# Patient Record
Sex: Male | Born: 1955 | Race: White | Hispanic: No | Smoking: Former smoker
Health system: Southern US, Community
[De-identification: ages and names within clinical notes are randomized; demographics above are authoritative.]

## PROBLEM LIST (undated history)

## (undated) DIAGNOSIS — E7800 Pure hypercholesterolemia, unspecified: Secondary | ICD-10-CM

## (undated) DIAGNOSIS — M1990 Unspecified osteoarthritis, unspecified site: Secondary | ICD-10-CM

## (undated) DIAGNOSIS — F32A Depression, unspecified: Secondary | ICD-10-CM

## (undated) DIAGNOSIS — G4730 Sleep apnea, unspecified: Secondary | ICD-10-CM

## (undated) DIAGNOSIS — T4145XA Adverse effect of unspecified anesthetic, initial encounter: Secondary | ICD-10-CM

## (undated) DIAGNOSIS — F419 Anxiety disorder, unspecified: Secondary | ICD-10-CM

## (undated) DIAGNOSIS — T8859XA Other complications of anesthesia, initial encounter: Secondary | ICD-10-CM

## (undated) DIAGNOSIS — K219 Gastro-esophageal reflux disease without esophagitis: Secondary | ICD-10-CM

## (undated) DIAGNOSIS — F329 Major depressive disorder, single episode, unspecified: Secondary | ICD-10-CM

## (undated) HISTORY — PX: OTHER SURGICAL HISTORY: SHX169

## (undated) HISTORY — PX: FOOT SURGERY: SHX648

## (undated) HISTORY — PX: LUMBAR LAMINECTOMY: SHX95

## (undated) HISTORY — PX: CATARACT EXTRACTION: SUR2

---
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
## 2001-02-23 ENCOUNTER — Emergency Department (HOSPITAL_COMMUNITY): Admission: EM | Admit: 2001-02-23 | Discharge: 2001-02-23 | Payer: Self-pay | Admitting: Internal Medicine

## 2001-02-23 ENCOUNTER — Encounter: Payer: Self-pay | Admitting: *Deleted

## 2003-05-18 ENCOUNTER — Emergency Department (HOSPITAL_COMMUNITY): Admission: EM | Admit: 2003-05-18 | Discharge: 2003-05-18 | Payer: Self-pay | Admitting: Internal Medicine

## 2003-09-01 ENCOUNTER — Emergency Department (HOSPITAL_COMMUNITY): Admission: EM | Admit: 2003-09-01 | Discharge: 2003-09-01 | Payer: Self-pay | Admitting: Emergency Medicine

## 2006-07-19 HISTORY — PX: COLONOSCOPY: SHX174

## 2006-08-18 ENCOUNTER — Emergency Department (HOSPITAL_COMMUNITY): Admission: EM | Admit: 2006-08-18 | Discharge: 2006-08-18 | Payer: Self-pay | Admitting: Emergency Medicine

## 2006-08-22 ENCOUNTER — Ambulatory Visit: Payer: Self-pay | Admitting: Orthopedic Surgery

## 2007-03-19 ENCOUNTER — Encounter: Payer: Self-pay | Admitting: Pulmonary Disease

## 2007-03-19 ENCOUNTER — Ambulatory Visit: Admission: RE | Admit: 2007-03-19 | Discharge: 2007-03-19 | Payer: Self-pay | Admitting: Internal Medicine

## 2007-03-29 ENCOUNTER — Ambulatory Visit: Payer: Self-pay | Admitting: Pulmonary Disease

## 2007-04-14 ENCOUNTER — Encounter: Payer: Self-pay | Admitting: Gastroenterology

## 2007-04-14 ENCOUNTER — Ambulatory Visit: Payer: Self-pay | Admitting: Gastroenterology

## 2007-04-14 ENCOUNTER — Ambulatory Visit (HOSPITAL_COMMUNITY): Admission: RE | Admit: 2007-04-14 | Discharge: 2007-04-14 | Payer: Self-pay | Admitting: Gastroenterology

## 2007-05-05 ENCOUNTER — Ambulatory Visit (HOSPITAL_COMMUNITY): Admission: RE | Admit: 2007-05-05 | Discharge: 2007-05-05 | Payer: Self-pay | Admitting: Internal Medicine

## 2007-10-10 ENCOUNTER — Ambulatory Visit (HOSPITAL_COMMUNITY): Admission: RE | Admit: 2007-10-10 | Discharge: 2007-10-10 | Payer: Self-pay | Admitting: Neurosurgery

## 2009-07-23 ENCOUNTER — Ambulatory Visit: Payer: Self-pay | Admitting: Pulmonary Disease

## 2009-07-23 DIAGNOSIS — G4733 Obstructive sleep apnea (adult) (pediatric): Secondary | ICD-10-CM | POA: Insufficient documentation

## 2009-07-23 DIAGNOSIS — E785 Hyperlipidemia, unspecified: Secondary | ICD-10-CM | POA: Insufficient documentation

## 2009-08-02 DIAGNOSIS — G2581 Restless legs syndrome: Secondary | ICD-10-CM | POA: Insufficient documentation

## 2009-12-12 ENCOUNTER — Ambulatory Visit (HOSPITAL_COMMUNITY): Admission: RE | Admit: 2009-12-12 | Discharge: 2009-12-12 | Payer: Self-pay | Admitting: Internal Medicine

## 2010-08-18 NOTE — Assessment & Plan Note (Signed)
Summary: consult for osa/rls   Copy to:  Carylon Perches Primary Provider/Referring Provider:  Carylon Perches  CC:  Sleep Consult.  History of Present Illness: The pt is a 55y/o male who I have been asked to see for osa.  He was diagnosed in 2008 with moderate osa, with split night study showing AHI of 32/hr and desat to 79%.  He was titrated to an optimal cpap pressure of 7cm.  Of note, he was also noted to have 472 plms, with 3/hr resulting in sleep disruption.  It was unclear how much this was due to sleep disordered breathing, and how much may represent a primary movement disorder of sleep.  The pt was started on cpap with a nasal mask and chin strap, but despite this, continued to have mouth leaks.  He is unsure of his current cpap pressure setting.  He has not used cpap in 6mos, but did feel it helped him when he first started using.  He goes to bed at 8pm, and arises at 3:30 am to start his day.  He is not rested upon arising.  He still has loud snoring, and admits to EDS while at work with difficulty focusing and concentrating.  He will doze in evening watching tv, but denies any issues with driving.  His weight is neutral over the last 2 years, and his epworth score today is 12.  He continues to have issues with RLS symptoms in the evening, and does have leg kicks during the night.  Preventive Screening-Counseling & Management  Alcohol-Tobacco     Smoking Status: quit  Current Medications (verified): 1)  Fluoxetine Hcl 20 Mg Caps (Fluoxetine Hcl) .... Take 1 Tablet By Mouth Once A Day 2)  Gemfibrozil 600 Mg Tabs (Gemfibrozil) .... Take 1 Tablet By Mouth Two Times A Day 3)  Pravastatin Sodium 40 Mg Tabs (Pravastatin Sodium) .... Take 1 Tablet By Mouth Once A Day  Allergies (verified): 1)  ! Codeine  Past History:  Past Medical History:  HYPERLIPIDEMIA (ICD-272.4) OBSTRUCTIVE SLEEP APNEA (ICD-327.23)    Past Surgical History: L arm surgery vasectomy  Family History: Reviewed history  and no changes required. heart disease: mother, father, sisters, and 1 brother brother had benign tumor in brain  Social History: Reviewed history and no changes required. Patient states former smoker.  started at age 15.  1 1/2 to 2 ppd.  quit 1987. pt is single. pt has children. pt work as a Forensic psychologist.  Smoking Status:  quit  Review of Systems       The patient complains of productive cough, nasal congestion/difficulty breathing through nose, hand/feet swelling, and joint stiffness or pain.  The patient denies shortness of breath with activity, shortness of breath at rest, non-productive cough, coughing up blood, chest pain, irregular heartbeats, acid heartburn, indigestion, loss of appetite, weight change, abdominal pain, difficulty swallowing, sore throat, tooth/dental problems, headaches, sneezing, itching, ear ache, anxiety, depression, rash, change in color of mucus, and fever.    Vital Signs:  Patient profile:   56 year old male Height:      70 inches Weight:      260.25 pounds BMI:     37.48 O2 Sat:      93 % on Room air Temp:     98.0 degrees F oral Pulse rate:   91 / minute BP sitting:   122 / 80  (left arm) Cuff size:   large  Vitals Entered By: Arman Filter LPN (July 23, 2009 1:56 PM)  O2 Flow:  Room air CC: Sleep Consult Comments Medications reviewed with patient Arman Filter LPN  July 23, 2009 1:57 PM    Physical Exam  General:  ow male in nad Eyes:  PERRLA and EOMI.   Nose:  patent without discharge. Mouth:  significant elongation of soft palate and uvula Neck:  no jvd, tmg, LN Lungs:  clear to auscultation Heart:  rrr, no mrg Abdomen:  soft and nontender, bs+ Extremities:  mild ankle edema, no cyanosis pulses intact distally  Neurologic:  alert and oriented, moves all 4.   Impression & Recommendations:  Problem # 1:  OBSTRUCTIVE SLEEP APNEA (ICD-327.23) the pt has known moderate osa, and currently is symptomatic off  cpap.  He is willing to try again, and I have also encouraged him to work on weight loss.  I think he needs to make the change to a full face mask in light of his ongoing mouth opening.  Rarely are chin straps successful, and usually aggrevate more than anything else.  Will also reoptimize pressure with an autotitrating device for 2 weeks.  Problem # 2:  RESTLESS LEGS SYNDROME (ICD-333.94) the pt's history is suggestive of RLS, and he did have a lot of leg jerks on prior sleep study.  Will give him a 4 week trial of dopamine agonist to see if leg symptoms improve.  Medications Added to Medication List This Visit: 1)  Fluoxetine Hcl 20 Mg Caps (Fluoxetine hcl) .... Take 1 tablet by mouth once a day 2)  Gemfibrozil 600 Mg Tabs (Gemfibrozil) .... Take 1 tablet by mouth two times a day 3)  Pravastatin Sodium 40 Mg Tabs (Pravastatin sodium) .... Take 1 tablet by mouth once a day 4)  Requip 0.5 Mg Tabs (Ropinirole hcl) .... One after dinner.  Other Orders: Consultation Level IV (16109) DME Referral (DME)  Patient Instructions: 1)  will try requip 0.5mg  after dinner to see if it helps your leg jerks/sensations 2)  will change to full facemask  3)  will use autotitrating machine for next 2 weeks to optimize pressure. 4)  work on weight loss 5)  I will call you once I receive your download.   Prescriptions: REQUIP 0.5 MG  TABS (ROPINIROLE HCL) one after dinner.  #30 x 6   Entered and Authorized by:   Barbaraann Share MD   Signed by:   Barbaraann Share MD on 07/23/2009   Method used:   Print then Give to Patient   RxID:   [id number]

## 2010-08-18 NOTE — Progress Notes (Signed)
Summary: Initial evaluation  Initial evaluation   Imported By: Jacklynn Ganong 01/07/2010 13:59:10  _____________________________________________________________________  External Attachment:    Type:   Image     Comment:   External Document

## 2010-10-29 ENCOUNTER — Ambulatory Visit (HOSPITAL_COMMUNITY): Payer: BC Managed Care – PPO | Attending: Internal Medicine

## 2010-10-29 DIAGNOSIS — R079 Chest pain, unspecified: Secondary | ICD-10-CM | POA: Insufficient documentation

## 2010-11-02 NOTE — Progress Notes (Signed)
  NAMEJAPHETH, DIEKMAN               ACCOUNT NO.:  [id number]  MEDICAL RECORD NO.:  [id number]           PATIENT TYPE:  O  LOCATION:  [facility]                          FACILITY:  [facility]  PHYSICIAN:  Kingsley Callander. Ouida Sills, MD       DATE OF BIRTH:  12-07-1955  DATE OF PROCEDURE:  10/29/2010 DATE OF DISCHARGE:                                PROGRESS NOTE   Mr. Sherrod underwent a stress test for evaluation of recent symptoms of chest pain.  He exercised 8 minutes 9 seconds (2 minutes 9 seconds at stage III of the Bruce protocol) attaining the maximal heart rate of 160 (96% of the age predicted maximal heart rate) at a workload of 10.1 mets and discontinued exercise due to fatigue.  There were no symptoms of chest pain.  There were no arrhythmias.  There were no ST-segment changes diagnostic of ischemia.  Baseline electrocardiogram revealed normal sinus rhythm at 70 beats per minute.  IMPRESSION:  No evidence of exercise-induced ischemia.     Kingsley Callander. Ouida Sills, MD     ROF/MEDQ  D:  10/29/2010  T:  10/29/2010  Job:  [id number]  Electronically Signed by Carylon Perches MD on 11/01/2010 10:09:39 AM

## 2010-12-01 NOTE — Procedures (Signed)
[name], [name]               ACCOUNT NO.:  [id number]   MEDICAL RECORD NO.:  [id number]          PATIENT TYPE:  OUT   LOCATION:  SLEEP LAB                     FACILITY:  [facility]   PHYSICIAN:  Barbaraann Share, MD,FCCPDATE OF BIRTH:  23-Jul-1955   DATE OF STUDY:  03/19/2007                            NOCTURNAL POLYSOMNOGRAM   REFERRING PHYSICIAN:  Kingsley Callander. Ouida Sills, MD   INDICATION FOR THE STUDY:  Hypersomnia with sleep apnea.   EPWORTH SCORE:  11.   SLEEP ARCHITECTURE:  The patient had total sleep time of 347 minutes  with decreased slow wave sleep as well as REM.  Sleep onset latency was  normal and REM onset was prolonged at 123 minutes.  Sleep efficiency was  mildly decreased at 87%.   RESPIRATORY DATA:  The patient was found by split night protocol to have  80 obstructive events in the first 149 minutes of sleep.  This gave him  an extrapolated apnea/hypopnea index over the first half of the night of  32 events per hour.  The events occurred in all body positions and there  was very loud snoring noted throughout.  By protocol, the patient was  then placed on a Respironics CPAP mask that was not further  characterized by the sleep technician.  CPAP titration was then  initiated, and at a final pressure of 7 cm of water, the patient seemed  to have excellent control of both his obstructive events as well as  snoring.  There was good control even through supine REM.   OXYGEN DATA:  O2 desaturation was noted as low as 79% with the patient's  obstructive events.   CARDIAC DATA:  No clinically significant cardiac arrhythmias were noted.   MOVEMENT/PARASOMNIA:  The patient was found to have 483 leg jerks with 3  per hour resulting in arousal and/or awakening.   IMPRESSION/RECOMMENDATIONS:  1. Split night study reveals moderate obstructive sleep apnea/hypopnea      syndrome with an apnea/hypopnea index of 32 events per hour during      the first half of the night and O2 desaturation  as low as 79%.  The      patient was then placed on CPAP with a Respironics mask of unknown      type and ultimately titrated to a final pressure of 7 cm of water      with excellent control even through supine REM.  2. Very large numbers of leg jerks with what appears to be significant      sleep disruption.  It is unclear whether this is related to the      patient's sleep disordered breathing, or whether he may have a      primary movement disorder of      sleep as well.  I would recommend treating the patient's      obstructive sleep apnea first, and then if he continues to be seen      symptomatic, consider whether the leg movements may be a      concomitant primary disorder.      Barbaraann Share, MD,FCCP  Diplomate, American Board of  Sleep  Medicine  Electronically Signed     KMC/MEDQ  D:  03/29/2007 15:15:23  T:  03/30/2007 10:42:42  Job:  [id number]

## 2010-12-01 NOTE — Op Note (Signed)
[name], [name]               ACCOUNT NO.:  [id number]   MEDICAL RECORD NO.:  [id number]          PATIENT TYPE:  AMB   LOCATION:  DAY                           FACILITY:  [facility]   PHYSICIAN:  Kassie Mends, M.D.      DATE OF BIRTH:  1955-10-22   DATE OF PROCEDURE:  04/14/2007  DATE OF DISCHARGE:                               OPERATIVE REPORT   REFERRING PHYSICIAN:  Kingsley Callander. Ouida Sills, MD.   PROCEDURE:  Colonoscopy with cold forceps polypectomy.   INDICATION FOR EXAM:  [name] is a 55 year old male who presents for  average risk colon cancer screening.   FINDINGS:  1. A 4-mm sessile rectal polyp removed via cold forceps.  Otherwise no      polyps, masses, inflammatory changes or AVMs seen.  2. Mild pan colonic diverticulosis most pronounced in the sigmoid      colon.  3. Normal retroflexed view of the rectum.   RECOMMENDATIONS:  1. Will call [name] with results of his polypectomy.  If his      polyp is adenomatous, then he needs a screening colonoscopy in 5      years and his first-degree relatives will need a screening      colonoscopy at age 54 and then every 5 years.  2. He should follow a high fiber diet.  He is given handout on high-      fiber diet polyps and diverticulosis.  3. No aspirin, NSAIDs or anticoagulation for 5 days.   MEDICATIONS:  1. Demerol 50 mg IV.  2. Versed 5 mg IV.   PROCEDURE TECHNIQUE:  Physical exam was performed and informed consent  was obtained from the patient after explaining the benefits, risks and  alternatives to the procedure.  The patient was connected to monitor and  placed in the a left lateral position.  Continuous oxygen was provided  by nasal cannula and IV medicine administered through an indwelling  cannula.  After administration of sedation and rectal exam, the  patient's rectum  was intubated and the scope was advanced under direct visualization to  the cecum.  The scope was removed slowly by carefully examining the  color, texture, anatomy and integrity of the mucosa on the way out.  The  patient was recovered in endoscopy and discharged home in satisfactory  condition.      Kassie Mends, M.D.  Electronically Signed     SM/MEDQ  D:  04/14/2007  T:  04/14/2007  Job:  [id number]   cc:   Kingsley Callander. Ouida Sills, MD  Fax: [phone]

## 2010-12-01 NOTE — Op Note (Signed)
NAMEEDIS, HUISH               ACCOUNT NO.:  [id number]   MEDICAL RECORD NO.:  [id number]          PATIENT TYPE:  OIB   LOCATION:  3599                         FACILITY:  [facility]   PHYSICIAN:  Clydene Fake, M.D.  DATE OF BIRTH:  Nov 05, 1955   DATE OF PROCEDURE:  10/10/2007  DATE OF DISCHARGE:                               OPERATIVE REPORT   PREOPERATIVE DIAGNOSIS:  Left ulnar neuropathy.   POSTOPERATIVE DIAGNOSIS:  Left ulnar neuropathy.   PROCEDURE:  Left ulnar transposition and neurolysis and decompression.   SURGEON:  Clydene Fake, M.D.   ANESTHESIA:  General endotracheal tube anesthesia.   ESTIMATED BLOOD LOSS:  Minimal.   BLOOD GIVEN:  None.   DRAINS:  None.   COMPLICATIONS:  None.   INDICATIONS FOR PROCEDURE:  The patient is a 55 year old gentleman who  has had left arm and hand pain and numbness which improved briefly with  therapy and probable padding, but symptoms worsened.  EMG nerve  conduction velocities showed severe left ulnar neuropathy at the elbow  and weakness in the intrinsic ulnar muscles.  The patient was brought in  for a decompression and transposition of the left ulnar nerve.   DESCRIPTION OF PROCEDURE:  The patient was brought to the operating room  and general anesthesia induced.  The patient was prepped and draped in a  sterile fashion.  A stab incision in the inside of the arm across the  elbow between the flexor and extensor surfaces.  The wound was injected  with 20 mL of 1% lidocaine and incision made.  The incision was taken  down to the fascia and we dissected over the ulnar nerve in the ulnar  notch.  Went through the subcutaneous and scar tissue and found the  ulnar nerve.  Decompressed the nerve going proximally and distally.  Placed Vesi-loops around the nerve carefully, transpositioning the nerve  out of the ulnar notch intermuscular septum proximally.  It was  dissected so that it would not compress into the ulnar nerve.  We  left  branches of the ulnar nerve intact as we transposed it.  The nerve was  in good position.  We extended the arm and the nerve had good enough  slack and we sutured over the ulnar notch as we brought the skin back  together so the nerve could not fall back into it.  We also elevated a  fascial sling loosely around the ulnar nerve.  The ulnar nerve slipped  through this nicely between flexion and extension.  This was sutured  with #2-0 Vicryl sutures.  The subcu was then approximated with  #2-0 and #3-0 Vicryl interrupted sutures.  The skin was closed with #4-0  nylon running suture.  A dressing was placed.   The patient was then awakened from anesthesia and transferred to the  recovery room in stable condition.           ______________________________  Clydene Fake, M.D.     JRH/MEDQ  D:  10/10/2007  T:  10/10/2007  Job:  [id number]

## 2011-04-12 LAB — BASIC METABOLIC PANEL
BUN: 14
CO2: 26
Calcium: 9.5
Chloride: 104
Creatinine, Ser: 1.16
GFR calc Af Amer: >60
GFR calc non Af Amer: >60
Glucose, Bld: 121 — ABNORMAL HIGH
Potassium: 4.1
Sodium: 140

## 2011-04-12 LAB — CBC
HCT: 38.3 — ABNORMAL LOW
Hemoglobin: 13.4
MCHC: 34.9
MCV: 83.7
Platelets: 308
RBC: 4.58
RDW: 13.7
WBC: 6

## 2011-04-12 LAB — URINALYSIS, ROUTINE W REFLEX MICROSCOPIC
Bilirubin Urine: NEGATIVE
Glucose, UA: NEGATIVE
Hgb urine dipstick: NEGATIVE
Ketones, ur: NEGATIVE
Nitrite: NEGATIVE
Protein, ur: NEGATIVE
Specific Gravity, Urine: 1.009
Urobilinogen, UA: 0.2
pH: 6

## 2011-04-12 LAB — PROTIME-INR
INR: 1
Prothrombin Time: 13.4

## 2011-04-12 LAB — APTT: aPTT: 29

## 2011-09-05 ENCOUNTER — Encounter (HOSPITAL_COMMUNITY): Payer: Self-pay | Admitting: Emergency Medicine

## 2011-09-05 ENCOUNTER — Emergency Department (HOSPITAL_COMMUNITY)
Admission: EM | Admit: 2011-09-05 | Discharge: 2011-09-05 | Disposition: A | Discharge status: Home / Self Care | Payer: BC Managed Care – PPO | Attending: Emergency Medicine | Admitting: Emergency Medicine

## 2011-09-05 ENCOUNTER — Emergency Department (HOSPITAL_COMMUNITY): Payer: BC Managed Care – PPO

## 2011-09-05 ENCOUNTER — Other Ambulatory Visit: Payer: Self-pay

## 2011-09-05 DIAGNOSIS — R42 Dizziness and giddiness: Secondary | ICD-10-CM | POA: Insufficient documentation

## 2011-09-05 DIAGNOSIS — R5381 Other malaise: Secondary | ICD-10-CM | POA: Insufficient documentation

## 2011-09-05 DIAGNOSIS — R109 Unspecified abdominal pain: Secondary | ICD-10-CM | POA: Insufficient documentation

## 2011-09-05 DIAGNOSIS — R110 Nausea: Secondary | ICD-10-CM

## 2011-09-05 DIAGNOSIS — I4949 Other premature depolarization: Secondary | ICD-10-CM | POA: Insufficient documentation

## 2011-09-05 HISTORY — DX: Pure hypercholesterolemia, unspecified: E78.00

## 2011-09-05 LAB — URINALYSIS, ROUTINE W REFLEX MICROSCOPIC
Bilirubin Urine: NEGATIVE
Glucose, UA: NEGATIVE mg/dL
Hgb urine dipstick: NEGATIVE
Ketones, ur: NEGATIVE mg/dL
Leukocytes, UA: NEGATIVE
Nitrite: NEGATIVE
Protein, ur: NEGATIVE mg/dL
Specific Gravity, Urine: 1.015 (ref 1.005–1.030)
Urobilinogen, UA: 0.2 mg/dL (ref 0.0–1.0)
pH: 7 (ref 5.0–8.0)

## 2011-09-05 LAB — CBC
HCT: 40.6 % (ref 39.0–52.0)
Hemoglobin: 14.2 g/dL (ref 13.0–17.0)
MCH: 28.7 pg (ref 26.0–34.0)
MCHC: 35 g/dL (ref 30.0–36.0)
MCV: 82 fL (ref 78.0–100.0)
Platelets: 239 10*3/uL (ref 150–400)
RBC: 4.95 MIL/uL (ref 4.22–5.81)
RDW: 13.3 % (ref 11.5–15.5)
WBC: 11.4 10*3/uL — ABNORMAL HIGH (ref 4.0–10.5)

## 2011-09-05 LAB — BASIC METABOLIC PANEL
BUN: 21 mg/dL (ref 6–23)
CO2: 26 mEq/L (ref 19–32)
Calcium: 10.3 mg/dL (ref 8.4–10.5)
Chloride: 100 mEq/L (ref 96–112)
Creatinine, Ser: 1.31 mg/dL (ref 0.50–1.35)
GFR calc Af Amer: 70 mL/min — ABNORMAL LOW (ref >90)
GFR calc non Af Amer: 60 mL/min — ABNORMAL LOW (ref >90)
Glucose, Bld: 146 mg/dL — ABNORMAL HIGH (ref 70–99)
Potassium: 4 mEq/L (ref 3.5–5.1)
Sodium: 137 mEq/L (ref 135–145)

## 2011-09-05 LAB — TROPONIN I: Troponin I: <0.3 ng/mL (ref <0.30)

## 2011-09-05 MED ORDER — SODIUM CHLORIDE 0.9 % IV SOLN
Freq: Once | INTRAVENOUS | Status: AC
  Administered 2011-09-05: 11:00:00 via INTRAVENOUS

## 2011-09-05 MED ORDER — FAMOTIDINE IN NACL 20-0.9 MG/50ML-% IV SOLN
20.0000 mg | Freq: Once | INTRAVENOUS | Status: AC
  Administered 2011-09-05: 20 mg via INTRAVENOUS
  Filled 2011-09-05: qty 50

## 2011-09-05 MED ORDER — ONDANSETRON HCL 4 MG/2ML IJ SOLN
4.0000 mg | Freq: Once | INTRAMUSCULAR | Status: AC
  Administered 2011-09-05: 4 mg via INTRAVENOUS
  Filled 2011-09-05: qty 2

## 2011-09-05 NOTE — ED Provider Notes (Signed)
History  Scribed for [employer]. Colon Branch, MD, the patient was seen in room APA09/APA09. This chart was scribed by Candelaria Stagers. The patient's care started at 11:13 AM     CSN: [id number]  Arrival date & time 09/05/11  4098   First MD Initiated Contact with Patient 09/05/11 1027      Chief Complaint  Patient presents with  . Nausea  . Dizziness     The history is provided by the patient.   [name] is a 56 y.o. male who presents to the Emergency Department complaining of sudden onset of nausea and dizziness that started this morning after eating breakfast.  Pt states that he experienced two episodes of dizziness which lasted about .  He also experienced hot flashes and felt like his "heart dropped".  He denies abdominal pain or burping.  He reports bunionectomy and spur removal on the right foot on 08/23/11.  His PCP is Dr. Ouida Sills and he has seen Dr. Pricilla Holm for the bunionectomy and spur removal on the right foot.   HPI ELEMENTS:  Location:  Onset: sudden  Duration: 30 min episodes Timing: waxing and waning Quality:  Severity:  Modifying factors:none Context: dizziness episodes occurred after eating breakfast Associated symptoms: denies abdominal pain or burping.  Reports hot flashes during episodes.          Past Medical History  Diagnosis Date  . Hypercholesteremia     Past Surgical History  Procedure Date  . Foot surgery     No family history on file.  History  Substance Use Topics  . Smoking status: Former Games developer  . Smokeless tobacco: Not on file  . Alcohol Use: No      Review of Systems  HENT: Negative for trouble swallowing.   Gastrointestinal: Positive for nausea and abdominal pain. Negative for vomiting.  Genitourinary: Positive for difficulty urinating.  Neurological: Positive for dizziness and weakness.  All other systems reviewed and are negative.    Allergies  Codeine  Home Medications  No current outpatient prescriptions on  file.  BP 131/79  Pulse 82  Temp 97.9 F (36.6 C)  Resp 19  Ht 5\' 10"  (1.778 m)  Wt 260 lb (117.935 kg)  BMI 37.31 kg/m2  SpO2 97%  Physical Exam  Nursing note and vitals reviewed. Constitutional: He is oriented to person, place, and time. He appears well-developed and well-nourished.  HENT:  Head: Normocephalic and atraumatic.  Eyes: EOM are normal. Right eye exhibits no discharge. Left eye exhibits no discharge.  Neck: Normal range of motion. Neck supple.  Cardiovascular: Normal rate and regular rhythm.  Exam reveals no gallop and no friction rub.   No murmur heard. Pulmonary/Chest: Effort normal and breath sounds normal.  Abdominal: Soft. Bowel sounds are normal.  Musculoskeletal:       Post up shoe from reported bunionectomy and spur removal on 08/23/11  Neurological: He is alert and oriented to person, place, and time.  Skin: Skin is warm and dry.  Psychiatric: He has a normal mood and affect. His behavior is normal.    ED Course  Procedures DIAGNOSTIC STUDIES: Oxygen Saturation is 97% on room air, normal by my interpretation.    COORDINATION OF CARE: 11:18AM Ordered: CBC ; Basic metabolic panel ; Troponin I ; DG Chest Port 1 View ; 0.9 % sodium chloride infusion ; ondansetron (ZOFRAN) injection 4 mg ; famotidine (PEPCID) IVPB 20 mg ; Urinalysis, Routine w reflex microscopic Results for orders placed during the hospital encounter  of 09/05/11  CBC      Component Value Range   WBC 11.4 (*) 4.0 - 10.5 (K/uL)   RBC 4.95  4.22 - 5.81 (MIL/uL)   Hemoglobin 14.2  13.0 - 17.0 (g/dL)   HCT 96.0  45.4 - 09.8 (%)   MCV 82.0  78.0 - 100.0 (fL)   MCH 28.7  26.0 - 34.0 (pg)   MCHC 35.0  30.0 - 36.0 (g/dL)   RDW 11.9  14.7 - 82.9 (%)   Platelets 239  150 - 400 (K/uL)  BASIC METABOLIC PANEL      Component Value Range   Sodium 137  135 - 145 (mEq/L)   Potassium 4.0  3.5 - 5.1 (mEq/L)   Chloride 100  96 - 112 (mEq/L)   CO2 26  19 - 32 (mEq/L)   Glucose, Bld 146 (*) 70 - 99  (mg/dL)   BUN 21  6 - 23 (mg/dL)   Creatinine, Ser 5.62  0.50 - 1.35 (mg/dL)   Calcium 13.0  8.4 - 10.5 (mg/dL)   GFR calc non Af Amer 60 (*) >90 (mL/min)   GFR calc Af Amer 70 (*) >90 (mL/min)  TROPONIN I      Component Value Range   Troponin I <0.30  <0.30 (ng/mL)  URINALYSIS, ROUTINE W REFLEX MICROSCOPIC      Component Value Range   Color, Urine YELLOW  YELLOW    APPearance CLEAR  CLEAR    Specific Gravity, Urine 1.015  1.005 - 1.030    pH 7.0  5.0 - 8.0    Glucose, UA NEGATIVE  NEGATIVE (mg/dL)   Hgb urine dipstick NEGATIVE  NEGATIVE    Bilirubin Urine NEGATIVE  NEGATIVE    Ketones, ur NEGATIVE  NEGATIVE (mg/dL)   Protein, ur NEGATIVE  NEGATIVE (mg/dL)   Urobilinogen, UA 0.2  0.0 - 1.0 (mg/dL)   Nitrite NEGATIVE  NEGATIVE    Leukocytes, UA NEGATIVE  NEGATIVE      Dg Chest Port 1 View  09/05/2011  *RADIOLOGY REPORT*  Clinical Data: Nausea, lightheaded  PORTABLE CHEST - 1 VIEW  Comparison: 10/04/2007  Findings: Lungs clear.  Heart size and pulmonary vascularity normal.  No effusion.  Visualized bones unremarkable.  IMPRESSION: No acute disease  Original Report Authenticated By: Osa Craver, M.D.   . Date: 09/05/2011  1036  Rate:80  Rhythm: normal sinus rhythm and premature ventricular contractions (PVC)  QRS Axis: normal  Intervals: normal  ST/T Wave abnormalities: normal  Conduction Disutrbances:none  Narrative Interpretation:   Old EKG Reviewed: unchanged c/w 10/04/2007       MDM  Patient with several episodes of nausea associated with weakness, feeling hot, and dizziness. Labs are unremarkable, chest xray is normal, EKG without acute changes. Given IVF, antiemetic and pepcid with improvement. Pt feels improved after observation and/or treatment in ED.Pt stable in ED with no significant deterioration in condition.The patient appears reasonably screened and/or stabilized for discharge and I doubt any other medical condition or other [facility] requiring further  screening, evaluation, or treatment in the ED at this time prior to discharge.  I personally performed the services described in this documentation, which was scribed in my presence. The recorded information has been reviewed and considered.   MDM Reviewed: nursing note and vitals Interpretation: labs, ECG and x-ray        Nicoletta Dress. Colon Branch, MD 09/05/11 1408

## 2011-09-05 NOTE — ED Notes (Signed)
Pt became suddenly nauseated and dizzy. Checked bp and was up. Pt is alert/oriented. C/o generalized weakness " a little". deneis trouble swallowing/breathing/blurred vision. Moving all extremities. Arm drift negative. Denies new meds. Pt is stable ambulatory.

## 2013-03-14 ENCOUNTER — Encounter (HOSPITAL_COMMUNITY): Payer: Self-pay | Admitting: Emergency Medicine

## 2013-03-14 ENCOUNTER — Emergency Department (HOSPITAL_COMMUNITY)
Admission: EM | Admit: 2013-03-14 | Discharge: 2013-03-14 | Disposition: A | Discharge status: Home / Self Care | Payer: BC Managed Care – PPO | Attending: Emergency Medicine | Admitting: Emergency Medicine

## 2013-03-14 DIAGNOSIS — M545 Low back pain, unspecified: Secondary | ICD-10-CM | POA: Insufficient documentation

## 2013-03-14 DIAGNOSIS — Z791 Long term (current) use of non-steroidal anti-inflammatories (NSAID): Secondary | ICD-10-CM | POA: Insufficient documentation

## 2013-03-14 DIAGNOSIS — M549 Dorsalgia, unspecified: Secondary | ICD-10-CM

## 2013-03-14 DIAGNOSIS — Z87891 Personal history of nicotine dependence: Secondary | ICD-10-CM | POA: Insufficient documentation

## 2013-03-14 DIAGNOSIS — E7800 Pure hypercholesterolemia, unspecified: Secondary | ICD-10-CM | POA: Insufficient documentation

## 2013-03-14 DIAGNOSIS — R110 Nausea: Secondary | ICD-10-CM | POA: Insufficient documentation

## 2013-03-14 DIAGNOSIS — Z79899 Other long term (current) drug therapy: Secondary | ICD-10-CM | POA: Insufficient documentation

## 2013-03-14 MED ORDER — CYCLOBENZAPRINE HCL 10 MG PO TABS: 10.0000 mg | ORAL_TABLET | Freq: Two times a day (BID) | ORAL | Status: DC | PRN

## 2013-03-14 MED ORDER — HYDROMORPHONE HCL PF 1 MG/ML IJ SOLN
1.0000 mg | Freq: Once | INTRAMUSCULAR | Status: AC
  Administered 2013-03-14: 1 mg via INTRAMUSCULAR
  Filled 2013-03-14: qty 1

## 2013-03-14 MED ORDER — OXYCODONE-ACETAMINOPHEN 5-325 MG PO TABS: 2.0000 | ORAL_TABLET | ORAL | Status: DC | PRN

## 2013-03-14 MED ORDER — PREDNISONE 20 MG PO TABS: ORAL_TABLET | ORAL | Status: DC

## 2013-03-14 NOTE — ED Notes (Signed)
Pt c/o of lower back pain which radiates to the right side x 1 week. Pt reports that pain is much worse today than over the past week. Pt has h/o back pain but usually is able to "walk it off."

## 2013-03-14 NOTE — ED Notes (Signed)
Pt requests work excuse for 4 days, MD made aware of pt request.

## 2013-03-14 NOTE — ED Notes (Signed)
States that he started having lower mid back pain with rightward radiation several weeks ago.  Reports tenderness to palpation at lower T spine area.  Denies any urinary symptoms.  States that his pain was worse this morning.  Complains of increased pain with movement.  Reports bending over to pick up an object from the floor and that started his pain.  States that he has had this same problem in the past and his physician told him he had "a floating disk."

## 2013-03-14 NOTE — ED Provider Notes (Signed)
CSN: [id number]     Arrival date & time 03/14/13  0730 History  This chart was scribed for Donnetta Hutching, MD by Blanchard Kelch, ED Scribe. The patient was seen in room APA07/APA07. Patient's care was started at 8:39 AM.    Chief Complaint  Patient presents with  . Back Pain   (Consider location/radiation/quality/duration/timing/severity/associated sxs/prior Treatment) HPI  HPI Comments: COLBERT CURENTON is a 57 y.o. male who presents to the Emergency Department complaining of recurring, gradually worsening, constant, non-radiating lower right sided back pain that began a few weeks ago but worsened yesterday at work. He describes the pain as "grabbing." Patient complains of associated nausea due to the pain. The pain is worsened by walking. Patient took a hydrocodone for pain with mild relief. The patient had a prior similar episode a few years ago. Patient had previous pain in his right hip a few weeks ago that he was prescribed Meloxicam for, but he had an itching allergic reaction to it.  PCP is Dr. Ouida Sills.   Past Medical History  Diagnosis Date  . Hypercholesteremia    Past Surgical History  Procedure Laterality Date  . Foot surgery    . Left elbow      No family history on file. History  Substance Use Topics  . Smoking status: Former Games developer  . Smokeless tobacco: Not on file  . Alcohol Use: No    Review of Systems: A complete 10 system review of systems was obtained and all systems are negative except as noted in the HPI and PMH.    Allergies  Codeine and Meloxicam  Home Medications   Current Outpatient Rx  Name  Route  Sig  Dispense  Refill  . allopurinol (ZYLOPRIM) 100 MG tablet   Oral   Take 100 mg by mouth daily.         Marland Kitchen atorvastatin (LIPITOR) 40 MG tablet   Oral   Take 40 mg by mouth daily.         . fenofibrate 160 MG tablet   Oral   Take 160 mg by mouth daily.         Marland Kitchen FLUoxetine (PROZAC) 20 MG capsule   Oral   Take 20 mg by mouth daily.          Marland Kitchen HYDROcodone-acetaminophen (NORCO) 5-325 MG per tablet   Oral   Take 1 tablet by mouth every 6 (six) hours as needed. Pain         . indomethacin (INDOCIN) 50 MG capsule   Oral   Take 50 mg by mouth 4 (four) times daily.          Triage Vitals: BP 108/70  Pulse 66  Temp(Src) 97.9 F (36.6 C) (Oral)  Resp 18  Ht 5\' 10"  (1.778 m)  Wt 210 lb (95.255 kg)  BMI 30.13 kg/m2  SpO2 96%  Physical Exam  Nursing note and vitals reviewed. Constitutional: He is oriented to person, place, and time. He appears well-developed and well-nourished.  HENT:  Head: Normocephalic and atraumatic.  Eyes: Conjunctivae and EOM are normal. Pupils are equal, round, and reactive to light.  Neck: Normal range of motion. Neck supple.  Cardiovascular: Normal rate, regular rhythm and normal heart sounds.   Pulmonary/Chest: Effort normal and breath sounds normal.  Abdominal: Soft. Bowel sounds are normal.  Musculoskeletal: Normal range of motion. He exhibits tenderness (right lower back).  Negative straight leg raise.   Neurological: He is alert and oriented to person, place, and time.  Skin: Skin is warm and dry.  Psychiatric: He has a normal mood and affect.    ED Course  Procedures (including critical care time)  DIAGNOSTIC STUDIES: Oxygen Saturation is 96% on room air, adequate by my interpretation.    COORDINATION OF CARE:  8:43 AM - Will order Diluadid. Recommend resting back and missing work for for a few days. Patient verbalizes understanding and agrees with treatment plan.    Labs Review Labs Reviewed - No data to display Imaging Review No results found.  MDM  No diagnosis found. Patient has uncomplicated back pain. No radicular symptoms. No bowel or bladder incontinence.  Rx Percocet, Flexeril 10 mg, prednisone.  I personally performed the services described in this documentation, which was scribed in my presence. The recorded information has been reviewed and is  accurate.     Donnetta Hutching, MD 03/14/13 [phone]

## 2013-11-13 ENCOUNTER — Other Ambulatory Visit (HOSPITAL_COMMUNITY): Payer: Self-pay | Admitting: Internal Medicine

## 2013-11-13 DIAGNOSIS — M5416 Radiculopathy, lumbar region: Secondary | ICD-10-CM

## 2013-11-16 ENCOUNTER — Ambulatory Visit (HOSPITAL_COMMUNITY)
Admission: RE | Admit: 2013-11-16 | Discharge: 2013-11-16 | Disposition: A | Discharge status: Home / Self Care | Payer: BC Managed Care – PPO | Source: Ambulatory Visit | Attending: Internal Medicine | Admitting: Internal Medicine

## 2013-11-16 ENCOUNTER — Encounter (HOSPITAL_COMMUNITY): Payer: Self-pay

## 2013-11-16 DIAGNOSIS — M51379 Other intervertebral disc degeneration, lumbosacral region without mention of lumbar back pain or lower extremity pain: Secondary | ICD-10-CM | POA: Insufficient documentation

## 2013-11-16 DIAGNOSIS — Q762 Congenital spondylolisthesis: Secondary | ICD-10-CM | POA: Insufficient documentation

## 2013-11-16 DIAGNOSIS — M25559 Pain in unspecified hip: Secondary | ICD-10-CM | POA: Insufficient documentation

## 2013-11-16 DIAGNOSIS — M5137 Other intervertebral disc degeneration, lumbosacral region: Secondary | ICD-10-CM | POA: Insufficient documentation

## 2013-11-16 DIAGNOSIS — M545 Low back pain, unspecified: Secondary | ICD-10-CM | POA: Insufficient documentation

## 2013-11-16 DIAGNOSIS — M5416 Radiculopathy, lumbar region: Secondary | ICD-10-CM

## 2014-03-12 ENCOUNTER — Other Ambulatory Visit (HOSPITAL_COMMUNITY): Payer: Self-pay | Admitting: Orthopaedic Surgery

## 2014-04-02 ENCOUNTER — Encounter (HOSPITAL_COMMUNITY): Payer: Self-pay | Admitting: Pharmacy Technician

## 2014-04-04 ENCOUNTER — Encounter (HOSPITAL_COMMUNITY): Payer: Self-pay

## 2014-04-04 ENCOUNTER — Ambulatory Visit (HOSPITAL_COMMUNITY)
Admission: RE | Admit: 2014-04-04 | Discharge: 2014-04-04 | Disposition: A | Discharge status: Home / Self Care | Payer: BC Managed Care – PPO | Source: Ambulatory Visit | Attending: Orthopaedic Surgery | Admitting: Orthopaedic Surgery

## 2014-04-04 ENCOUNTER — Encounter (HOSPITAL_COMMUNITY)
Admission: RE | Admit: 2014-04-04 | Discharge: 2014-04-04 | Disposition: A | Discharge status: Home / Self Care | Payer: BC Managed Care – PPO | Source: Ambulatory Visit | Attending: Orthopaedic Surgery | Admitting: Orthopaedic Surgery

## 2014-04-04 DIAGNOSIS — Z01818 Encounter for other preprocedural examination: Secondary | ICD-10-CM | POA: Diagnosis not present

## 2014-04-04 HISTORY — DX: Anxiety disorder, unspecified: F41.9

## 2014-04-04 HISTORY — DX: Other complications of anesthesia, initial encounter: T88.59XA

## 2014-04-04 HISTORY — DX: Adverse effect of unspecified anesthetic, initial encounter: T41.45XA

## 2014-04-04 LAB — COMPREHENSIVE METABOLIC PANEL
ALT: 16 U/L (ref 0–53)
AST: 21 U/L (ref 0–37)
Albumin: 4.7 g/dL (ref 3.5–5.2)
Alkaline Phosphatase: 44 U/L (ref 39–117)
Anion gap: 17 — ABNORMAL HIGH (ref 5–15)
BUN: 18 mg/dL (ref 6–23)
CO2: 22 mEq/L (ref 19–32)
Calcium: 9.7 mg/dL (ref 8.4–10.5)
Chloride: 97 mEq/L (ref 96–112)
Creatinine, Ser: 1.13 mg/dL (ref 0.50–1.35)
GFR calc Af Amer: 82 mL/min — ABNORMAL LOW (ref >90)
GFR calc non Af Amer: 70 mL/min — ABNORMAL LOW (ref >90)
Glucose, Bld: 87 mg/dL (ref 70–99)
Potassium: 4.2 mEq/L (ref 3.7–5.3)
Sodium: 136 mEq/L — ABNORMAL LOW (ref 137–147)
Total Bilirubin: 0.6 mg/dL (ref 0.3–1.2)
Total Protein: 7.5 g/dL (ref 6.0–8.3)

## 2014-04-04 LAB — CBC
HCT: 39.3 % (ref 39.0–52.0)
Hemoglobin: 14 g/dL (ref 13.0–17.0)
MCH: 28.6 pg (ref 26.0–34.0)
MCHC: 35.6 g/dL (ref 30.0–36.0)
MCV: 80.2 fL (ref 78.0–100.0)
Platelets: 286 10*3/uL (ref 150–400)
RBC: 4.9 MIL/uL (ref 4.22–5.81)
RDW: 13.1 % (ref 11.5–15.5)
WBC: 8 10*3/uL (ref 4.0–10.5)

## 2014-04-04 LAB — URINALYSIS, ROUTINE W REFLEX MICROSCOPIC
Bilirubin Urine: NEGATIVE
Glucose, UA: NEGATIVE mg/dL
Hgb urine dipstick: NEGATIVE
Ketones, ur: NEGATIVE mg/dL
Leukocytes, UA: NEGATIVE
Nitrite: NEGATIVE
Protein, ur: NEGATIVE mg/dL
Specific Gravity, Urine: 1.006 (ref 1.005–1.030)
Urobilinogen, UA: 0.2 mg/dL (ref 0.0–1.0)
pH: 6 (ref 5.0–8.0)

## 2014-04-04 LAB — TYPE AND SCREEN
ABO/RH(D): O POS
Antibody Screen: NEGATIVE

## 2014-04-04 LAB — PROTIME-INR
INR: 1.05 (ref 0.00–1.49)
Prothrombin Time: 13.7 seconds (ref 11.6–15.2)

## 2014-04-04 LAB — ABO/RH: ABO/RH(D): O POS

## 2014-04-04 LAB — SURGICAL PCR SCREEN
MRSA, PCR: NEGATIVE
Staphylococcus aureus: POSITIVE — AB

## 2014-04-04 NOTE — Pre-Procedure Instructions (Signed)
[name]  04/04/2014   Your procedure is scheduled on:  Friday, September 25 AM.  Report to [facility] Admitting at 5:30  Call this number if you have problems the morning of surgery: [phone]   Remember:   Do not eat food or drink liquids after midnight Thursday, September, 24.   Take these medicines the morning of surgery with A SIP OF WATER: None.               Stop taking ibuprofen (ADVIL,MOTRIN) and Vitamins.    Do not wear jewelry, make-up or nail polish.  Do not wear lotions, powders, or perfumes.   Men may shave face and neck.  Do not bring valuables to the hospital.              [facility] is not responsible for any belongings or valuables.               Contacts, dentures or bridgework may not be worn into surgery.  Leave suitcase in the car. After surgery it may be brought to your room.  For patients admitted to the hospital, discharge time is determined by your treatment team.               Patients discharged the day of surgery will not be allowed to drive home.  Name and phone number of your driver: -   Special Instructions: Review  [facility] - Preparing For Surgery.   Please read over the following fact sheets that you were given: Pain Booklet, Coughing and Deep Breathing, Blood Transfusion Information and Surgical Site Infection Prevention And Incentive Spirometery

## 2014-04-04 NOTE — Progress Notes (Signed)
I called a prescription for Mupirocin ointment to [facility], [place], [place].

## 2014-04-08 NOTE — Progress Notes (Signed)
Per MED RECORDS THEY DO NOT HAVE A STRESS TEST OR ECHO.

## 2014-04-10 NOTE — H&P (Signed)
[facility]   A Division of [employer], [place]   [address] [place], [place] [address] Telephone: [phone]  Fax: [phone]     PATIENT: [name], [name]   MR#: [id number]  DOB: December 19, 1955       HISTORY OF PRESENT ILLNESS:  A  58 year old male referred by Dr. Asencion Noble for consultation for low back pain with bilateral leg pain, worse on the left leg than the right leg.  He has had pain since December.  It is gradually getting worse.  He has pain and numbness with weakness in the leg primarily in the calf.  He had seen Dr. Christella Noa and states he wanted another opinion.  He had an MRI done at [facility] available for review.  He has been on ibuprofen.He had a epidural with Dr. Ernestina Patches on July 27, two weeks ago and states he only got relief for a couple of days.     CURRENT MEDICATIONS:  Include fenofibrate, atorvastatin, fluoxetine.   ALLERGIES:  MELOXICAM and CODEINE.   PAST MEDICAL/SURGICAL HISTORY:  He had a previous left elbow surgery and right foot bunion surgery.   SOCIAL HISTORY:  He does not smoke but was a 1 pack-a-day x12-year smoker.  He does not drink.  Patient is divorced.  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   FAMILY HISTORY:  Positive for diabetes, heart disease, cancer of the brain.   REVIEW OF SYSTEMS:  Positive for alcoholism and anxiety in the past.   PHYSICAL EXAMINATION:  Patient is 5 feet 10 inches, 210 pounds.  Alert and oriented.  WD, WN, NAD.  Extraocular movements intact.  Lungs are clear.  No supraclavicular lymphadenopathy.  Heart:  Regular rate and rhythm.  Abdomen is soft and nontender.  Knee jerk is 2+ and symmetrical, ankle jerk is 1+ and symmetrical.  Anterior tib, EHL is intact.   He has tight hamstrings, pain with straight leg raising 80 degrees.  Limitation forward flexion fingertips only to mid tibial level.    He has sciatic notch tenderness on the left.  Positive straight leg raising left at 80 degrees,  right negative at 90 degrees.  Negative reverse straight leg raising.  Left gastrocsoleus fatigues out with single-stance toe raises after 3.  He has tight hamstrings, limitation of forward flexion with fingertips to midtibia to lower third of the tibia level.   RADIOGRAPHS/TEST:  MRI scan ordered by Dr. Willey Blade on 11/16/2013 shows bilateral pars defects at L5-S1, grade 1 spondylolisthesis, Mobic changes at 5-1.  Moderate to severe bilateral foraminal stenosis.   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  PLAN:  He has failed injections.  He has to be careful with pain medicines with past history of alcoholism.  We discussed options with him.  He would like to proceed with decompression, fusion procedure.  A TLIF will be needed with removal of the abnormal pars, Gill procedure, correction of his foraminal stenosis and central stenosis from the spondylolisthesis.  Surgery discussed including reoperation, pseudoarthrosis, dural tears, spine fluid leakage.  All questions answered.  He understands and request we proceed.  The patient's own bone would be used for fusion, pedicle instrumentation, carbon/PEEK cage packed with his own bone and bilateral lateral gutter fusion since he has spondylolisthesis with instability.

## 2014-04-11 MED ORDER — CEFAZOLIN SODIUM-DEXTROSE 2-3 GM-% IV SOLR
2.0000 g | INTRAVENOUS | Status: AC
  Administered 2014-04-12: 2 g via INTRAVENOUS
  Filled 2014-04-11: qty 50

## 2014-04-12 ENCOUNTER — Inpatient Hospital Stay (HOSPITAL_COMMUNITY): Payer: BC Managed Care – PPO

## 2014-04-12 ENCOUNTER — Encounter (HOSPITAL_COMMUNITY)
Admission: RE | Disposition: A | Discharge status: Home / Self Care | Payer: BC Managed Care – PPO | Source: Ambulatory Visit | Attending: Orthopaedic Surgery

## 2014-04-12 ENCOUNTER — Inpatient Hospital Stay (HOSPITAL_COMMUNITY)
Admission: RE | Admit: 2014-04-12 | Discharge: 2014-04-14 | DRG: 460 | Disposition: A | Discharge status: Home / Self Care | Payer: BC Managed Care – PPO | Source: Ambulatory Visit | Attending: Orthopaedic Surgery | Admitting: Orthopaedic Surgery

## 2014-04-12 ENCOUNTER — Inpatient Hospital Stay (HOSPITAL_COMMUNITY): Payer: BC Managed Care – PPO | Admitting: Certified Registered Nurse Anesthetist

## 2014-04-12 ENCOUNTER — Encounter (HOSPITAL_COMMUNITY): Payer: BC Managed Care – PPO | Admitting: Certified Registered Nurse Anesthetist

## 2014-04-12 ENCOUNTER — Encounter (HOSPITAL_COMMUNITY): Payer: Self-pay | Admitting: *Deleted

## 2014-04-12 DIAGNOSIS — M48061 Spinal stenosis, lumbar region without neurogenic claudication: Secondary | ICD-10-CM | POA: Diagnosis present

## 2014-04-12 DIAGNOSIS — E7800 Pure hypercholesterolemia, unspecified: Secondary | ICD-10-CM | POA: Diagnosis present

## 2014-04-12 DIAGNOSIS — Z87891 Personal history of nicotine dependence: Secondary | ICD-10-CM

## 2014-04-12 DIAGNOSIS — D62 Acute posthemorrhagic anemia: Secondary | ICD-10-CM | POA: Diagnosis present

## 2014-04-12 DIAGNOSIS — Q762 Congenital spondylolisthesis: Principal | ICD-10-CM

## 2014-04-12 DIAGNOSIS — M545 Low back pain, unspecified: Secondary | ICD-10-CM | POA: Diagnosis present

## 2014-04-12 DIAGNOSIS — M4316 Spondylolisthesis, lumbar region: Secondary | ICD-10-CM | POA: Diagnosis present

## 2014-04-12 SURGERY — POSTERIOR LUMBAR FUSION 1 LEVEL
Anesthesia: General | Site: Back

## 2014-04-12 MED ORDER — PHENYLEPHRINE HCL 10 MG/ML IJ SOLN
10.0000 mg | INTRAVENOUS | Status: DC | PRN
  Administered 2014-04-12: 10 ug via INTRAVENOUS

## 2014-04-12 MED ORDER — HYDROMORPHONE HCL 1 MG/ML IJ SOLN
0.2500 mg | INTRAMUSCULAR | Status: DC | PRN
  Administered 2014-04-12 (×2): 0.5 mg via INTRAVENOUS

## 2014-04-12 MED ORDER — ONDANSETRON HCL 4 MG/2ML IJ SOLN: 4.0000 mg | INTRAMUSCULAR | Status: DC | PRN

## 2014-04-12 MED ORDER — LACTATED RINGERS IV SOLN
INTRAVENOUS | Status: DC | PRN
  Administered 2014-04-12 (×3): via INTRAVENOUS

## 2014-04-12 MED ORDER — METHOCARBAMOL 500 MG PO TABS
500.0000 mg | ORAL_TABLET | Freq: Four times a day (QID) | ORAL | Status: DC | PRN
  Administered 2014-04-12 – 2014-04-14 (×3): 500 mg via ORAL
  Filled 2014-04-12 (×2): qty 1

## 2014-04-12 MED ORDER — PANTOPRAZOLE SODIUM 40 MG IV SOLR
40.0000 mg | Freq: Every day | INTRAVENOUS | Status: DC
  Filled 2014-04-12: qty 40

## 2014-04-12 MED ORDER — ACETAMINOPHEN 650 MG RE SUPP: 650.0000 mg | RECTAL | Status: DC | PRN

## 2014-04-12 MED ORDER — ZOLPIDEM TARTRATE 5 MG PO TABS: 5.0000 mg | ORAL_TABLET | Freq: Every evening | ORAL | Status: DC | PRN

## 2014-04-12 MED ORDER — 0.9 % SODIUM CHLORIDE (POUR BTL) OPTIME
TOPICAL | Status: DC | PRN
  Administered 2014-04-12: 1000 mL

## 2014-04-12 MED ORDER — METHOCARBAMOL 500 MG PO TABS
ORAL_TABLET | ORAL | Status: AC
  Filled 2014-04-12: qty 1

## 2014-04-12 MED ORDER — FLEET ENEMA 7-19 GM/118ML RE ENEM: 1.0000 | ENEMA | Freq: Once | RECTAL | Status: AC | PRN

## 2014-04-12 MED ORDER — OXYCODONE HCL 5 MG/5ML PO SOLN: 5.0000 mg | Freq: Once | ORAL | Status: AC | PRN

## 2014-04-12 MED ORDER — SENNOSIDES-DOCUSATE SODIUM 8.6-50 MG PO TABS: 1.0000 | ORAL_TABLET | Freq: Every evening | ORAL | Status: DC | PRN

## 2014-04-12 MED ORDER — PROPOFOL 10 MG/ML IV BOLUS
INTRAVENOUS | Status: DC | PRN
  Administered 2014-04-12: 180 mg via INTRAVENOUS

## 2014-04-12 MED ORDER — MIDAZOLAM HCL 2 MG/2ML IJ SOLN
INTRAMUSCULAR | Status: AC
  Filled 2014-04-12: qty 2

## 2014-04-12 MED ORDER — CHLORHEXIDINE GLUCONATE 4 % EX LIQD
60.0000 mL | Freq: Once | CUTANEOUS | Status: DC
  Filled 2014-04-12: qty 60

## 2014-04-12 MED ORDER — BISACODYL 10 MG RE SUPP: 10.0000 mg | Freq: Every day | RECTAL | Status: DC | PRN

## 2014-04-12 MED ORDER — OXYCODONE-ACETAMINOPHEN 5-325 MG PO TABS
1.0000 | ORAL_TABLET | ORAL | Status: DC | PRN
  Administered 2014-04-13 – 2014-04-14 (×5): 2 via ORAL
  Filled 2014-04-12 (×5): qty 2

## 2014-04-12 MED ORDER — THROMBIN 20000 UNITS EX SOLR
CUTANEOUS | Status: DC | PRN
  Administered 2014-04-12: 08:00:00 via TOPICAL

## 2014-04-12 MED ORDER — FENTANYL CITRATE 0.05 MG/ML IJ SOLN
INTRAMUSCULAR | Status: AC
  Filled 2014-04-12: qty 5

## 2014-04-12 MED ORDER — ACETAMINOPHEN 325 MG PO TABS: 650.0000 mg | ORAL_TABLET | ORAL | Status: DC | PRN

## 2014-04-12 MED ORDER — ONDANSETRON HCL 4 MG/2ML IJ SOLN: 4.0000 mg | Freq: Four times a day (QID) | INTRAMUSCULAR | Status: DC | PRN

## 2014-04-12 MED ORDER — HYDROMORPHONE HCL 1 MG/ML IJ SOLN: 0.5000 mg | INTRAMUSCULAR | Status: DC | PRN

## 2014-04-12 MED ORDER — PHENYLEPHRINE HCL 10 MG/ML IJ SOLN
INTRAMUSCULAR | Status: DC | PRN
  Administered 2014-04-12 (×3): 80 ug via INTRAVENOUS
  Administered 2014-04-12 (×2): 40 ug via INTRAVENOUS
  Administered 2014-04-12 (×6): 80 ug via INTRAVENOUS

## 2014-04-12 MED ORDER — METHOCARBAMOL 500 MG PO TABS: 500.0000 mg | ORAL_TABLET | Freq: Four times a day (QID) | ORAL | Status: DC | PRN

## 2014-04-12 MED ORDER — CEFAZOLIN SODIUM 1-5 GM-% IV SOLN
1.0000 g | Freq: Three times a day (TID) | INTRAVENOUS | Status: AC
  Administered 2014-04-12 (×2): 1 g via INTRAVENOUS
  Filled 2014-04-12 (×2): qty 50

## 2014-04-12 MED ORDER — NEOSTIGMINE METHYLSULFATE 10 MG/10ML IV SOLN
INTRAVENOUS | Status: DC | PRN
  Administered 2014-04-12: 5 mg via INTRAVENOUS

## 2014-04-12 MED ORDER — DEXAMETHASONE SODIUM PHOSPHATE 10 MG/ML IJ SOLN
INTRAMUSCULAR | Status: DC | PRN
  Administered 2014-04-12: 10 mg via INTRAVENOUS

## 2014-04-12 MED ORDER — OXYCODONE HCL 5 MG PO TABS
5.0000 mg | ORAL_TABLET | Freq: Once | ORAL | Status: AC | PRN
  Administered 2014-04-12: 5 mg via ORAL

## 2014-04-12 MED ORDER — LIDOCAINE HCL (CARDIAC) 20 MG/ML IV SOLN
INTRAVENOUS | Status: DC | PRN
  Administered 2014-04-12: 100 mg via INTRAVENOUS

## 2014-04-12 MED ORDER — ONDANSETRON HCL 4 MG/2ML IJ SOLN
INTRAMUSCULAR | Status: DC | PRN
  Administered 2014-04-12: 4 mg via INTRAVENOUS

## 2014-04-12 MED ORDER — SODIUM CHLORIDE 0.9 % IV SOLN: 250.0000 mL | INTRAVENOUS | Status: DC

## 2014-04-12 MED ORDER — FENOFIBRATE 160 MG PO TABS
160.0000 mg | ORAL_TABLET | Freq: Every day | ORAL | Status: DC
  Administered 2014-04-13 – 2014-04-14 (×2): 160 mg via ORAL
  Filled 2014-04-12 (×3): qty 1

## 2014-04-12 MED ORDER — DOCUSATE SODIUM 100 MG PO CAPS
100.0000 mg | ORAL_CAPSULE | Freq: Two times a day (BID) | ORAL | Status: DC
  Administered 2014-04-12 – 2014-04-14 (×4): 100 mg via ORAL
  Filled 2014-04-12 (×5): qty 1

## 2014-04-12 MED ORDER — GLYCOPYRROLATE 0.2 MG/ML IJ SOLN
INTRAMUSCULAR | Status: DC | PRN
  Administered 2014-04-12: .6 mg via INTRAVENOUS

## 2014-04-12 MED ORDER — FENTANYL CITRATE 0.05 MG/ML IJ SOLN
INTRAMUSCULAR | Status: DC | PRN
  Administered 2014-04-12: 50 ug via INTRAVENOUS
  Administered 2014-04-12: 100 ug via INTRAVENOUS
  Administered 2014-04-12 (×2): 50 ug via INTRAVENOUS
  Administered 2014-04-12 (×2): 100 ug via INTRAVENOUS
  Administered 2014-04-12: 50 ug via INTRAVENOUS

## 2014-04-12 MED ORDER — KCL IN DEXTROSE-NACL 20-5-0.45 MEQ/L-%-% IV SOLN
INTRAVENOUS | Status: DC
  Filled 2014-04-12 (×5): qty 1000

## 2014-04-12 MED ORDER — OXYCODONE-ACETAMINOPHEN 5-325 MG PO TABS: 1.0000 | ORAL_TABLET | ORAL | Status: DC | PRN

## 2014-04-12 MED ORDER — THROMBIN 20000 UNITS EX SOLR
CUTANEOUS | Status: AC
  Filled 2014-04-12: qty 20000

## 2014-04-12 MED ORDER — PHENOL 1.4 % MT LIQD: 1.0000 | OROMUCOSAL | Status: DC | PRN

## 2014-04-12 MED ORDER — ATORVASTATIN CALCIUM 40 MG PO TABS
40.0000 mg | ORAL_TABLET | Freq: Every day | ORAL | Status: DC
  Administered 2014-04-12 – 2014-04-13 (×2): 40 mg via ORAL
  Filled 2014-04-12 (×3): qty 1

## 2014-04-12 MED ORDER — KETOROLAC TROMETHAMINE 30 MG/ML IJ SOLN
30.0000 mg | Freq: Four times a day (QID) | INTRAMUSCULAR | Status: AC
  Administered 2014-04-12 – 2014-04-13 (×4): 30 mg via INTRAVENOUS
  Filled 2014-04-12 (×5): qty 1

## 2014-04-12 MED ORDER — ROCURONIUM BROMIDE 100 MG/10ML IV SOLN
INTRAVENOUS | Status: DC | PRN
  Administered 2014-04-12 (×2): 50 mg via INTRAVENOUS

## 2014-04-12 MED ORDER — METHOCARBAMOL 1000 MG/10ML IJ SOLN
500.0000 mg | Freq: Four times a day (QID) | INTRAVENOUS | Status: DC | PRN
  Filled 2014-04-12: qty 5

## 2014-04-12 MED ORDER — MIDAZOLAM HCL 5 MG/5ML IJ SOLN
INTRAMUSCULAR | Status: DC | PRN
  Administered 2014-04-12: 2 mg via INTRAVENOUS

## 2014-04-12 MED ORDER — HYDROMORPHONE HCL 1 MG/ML IJ SOLN
INTRAMUSCULAR | Status: AC
  Filled 2014-04-12: qty 1

## 2014-04-12 MED ORDER — SODIUM CHLORIDE 0.9 % IJ SOLN
3.0000 mL | Freq: Two times a day (BID) | INTRAMUSCULAR | Status: DC
  Administered 2014-04-13 (×2): 3 mL via INTRAVENOUS

## 2014-04-12 MED ORDER — PANTOPRAZOLE SODIUM 40 MG PO TBEC
40.0000 mg | DELAYED_RELEASE_TABLET | Freq: Every day | ORAL | Status: DC
  Administered 2014-04-12 – 2014-04-13 (×2): 40 mg via ORAL
  Filled 2014-04-12: qty 1

## 2014-04-12 MED ORDER — SODIUM CHLORIDE 0.9 % IJ SOLN
3.0000 mL | INTRAMUSCULAR | Status: DC | PRN
Start: 2014-04-12 — End: 2014-04-14

## 2014-04-12 MED ORDER — MENTHOL 3 MG MT LOZG: 1.0000 | LOZENGE | OROMUCOSAL | Status: DC | PRN

## 2014-04-12 MED ORDER — OXYCODONE HCL 5 MG PO TABS
ORAL_TABLET | ORAL | Status: AC
  Filled 2014-04-12: qty 1

## 2014-04-12 MED ORDER — DIPHENHYDRAMINE HCL 50 MG/ML IJ SOLN
INTRAMUSCULAR | Status: DC | PRN
  Administered 2014-04-12: 12.5 mg via INTRAVENOUS

## 2014-04-12 MED ORDER — PROPOFOL 10 MG/ML IV BOLUS
INTRAVENOUS | Status: AC
  Filled 2014-04-12: qty 20

## 2014-04-12 MED ORDER — EPHEDRINE SULFATE 50 MG/ML IJ SOLN
INTRAMUSCULAR | Status: DC | PRN
  Administered 2014-04-12 (×2): 10 mg via INTRAVENOUS

## 2014-04-12 SURGICAL SUPPLY — 71 items
ADH SKN CLS APL DERMABOND .7 (GAUZE/BANDAGES/DRESSINGS) ×1
BLADE SURG ROTATE 9660 (MISCELLANEOUS) IMPLANT
BUR ROUND FLUTED 4 SOFT TCH (BURR) ×2 IMPLANT
CAP SPINAL LOCKING TI (Cap) ×6 IMPLANT
CORDS BIPOLAR (ELECTRODE) ×2 IMPLANT
COVER MAYO STAND STRL (DRAPES) ×1 IMPLANT
COVER SURGICAL LIGHT HANDLE (MISCELLANEOUS) ×2 IMPLANT
DERMABOND ADVANCED (GAUZE/BANDAGES/DRESSINGS) ×1
DERMABOND ADVANCED .7 DNX12 (GAUZE/BANDAGES/DRESSINGS) ×1 IMPLANT
DRAPE C-ARM 42X72 X-RAY (DRAPES) ×2 IMPLANT
DRAPE MICROSCOPE LEICA (MISCELLANEOUS) ×2 IMPLANT
DRAPE SURG 17X23 STRL (DRAPES) ×5 IMPLANT
DRAPE TABLE COVER HEAVY DUTY (DRAPES) ×2 IMPLANT
DRSG EMULSION OIL 3X3 NADH (GAUZE/BANDAGES/DRESSINGS) ×1 IMPLANT
DRSG MEPILEX BORDER 4X12 (GAUZE/BANDAGES/DRESSINGS) ×1 IMPLANT
DRSG MEPILEX BORDER 4X4 (GAUZE/BANDAGES/DRESSINGS) ×1 IMPLANT
DRSG MEPILEX BORDER 4X8 (GAUZE/BANDAGES/DRESSINGS) ×1 IMPLANT
DRSG PAD ABDOMINAL 8X10 ST (GAUZE/BANDAGES/DRESSINGS) ×2 IMPLANT
DURAPREP 26ML APPLICATOR (WOUND CARE) ×2 IMPLANT
ELECT BLADE 4.0 EZ CLEAN MEGAD (MISCELLANEOUS) ×2
ELECT CAUTERY BLADE 6.4 (BLADE) ×2 IMPLANT
ELECT REM PT RETURN 9FT ADLT (ELECTROSURGICAL) ×2
ELECTRODE BLDE 4.0 EZ CLN MEGD (MISCELLANEOUS) ×1 IMPLANT
ELECTRODE REM PT RTRN 9FT ADLT (ELECTROSURGICAL) ×1 IMPLANT
EVACUATOR 1/8 PVC DRAIN (DRAIN) ×1 IMPLANT
GLOVE BIOGEL PI IND STRL 7.5 (GLOVE) ×1 IMPLANT
GLOVE BIOGEL PI IND STRL 8 (GLOVE) ×1 IMPLANT
GLOVE BIOGEL PI INDICATOR 7.5 (GLOVE) ×1
GLOVE BIOGEL PI INDICATOR 8 (GLOVE) ×1
GLOVE ECLIPSE 7.0 STRL STRAW (GLOVE) ×2 IMPLANT
GLOVE ORTHO TXT STRL SZ7.5 (GLOVE) ×2 IMPLANT
GOWN STRL REUS W/ TWL LRG LVL3 (GOWN DISPOSABLE) ×3 IMPLANT
GOWN STRL REUS W/TWL LRG LVL3 (GOWN DISPOSABLE) ×6
HEMOSTAT SURGICEL 2X14 (HEMOSTASIS) IMPLANT
KIT BASIN OR (CUSTOM PROCEDURE TRAY) ×2 IMPLANT
KIT POSITION SURG JACKSON T1 (MISCELLANEOUS) ×2 IMPLANT
KIT ROOM TURNOVER OR (KITS) ×2 IMPLANT
MANIFOLD NEPTUNE II (INSTRUMENTS) ×2 IMPLANT
MILL MEDIUM DISP (BLADE) ×1 IMPLANT
NDL SUT .5 MAYO 1.404X.05X (NEEDLE) IMPLANT
NEEDLE MAYO TAPER (NEEDLE)
NS IRRIG 1000ML POUR BTL (IV SOLUTION) ×2 IMPLANT
PACK LAMINECTOMY ORTHO (CUSTOM PROCEDURE TRAY) ×2 IMPLANT
PAD ARMBOARD 7.5X6 YLW CONV (MISCELLANEOUS) ×4 IMPLANT
PATTIES SURGICAL .5 X.5 (GAUZE/BANDAGES/DRESSINGS) ×1 IMPLANT
PATTIES SURGICAL .75X.75 (GAUZE/BANDAGES/DRESSINGS) ×2 IMPLANT
ROD 40MM (Rod) ×4 IMPLANT
ROD SPNL CVD 40X5.5XHRD NS (Rod) IMPLANT
SCREW MATRIX MIS 6.0X35MM (Screw) ×1 IMPLANT
SCREW MATRIX MIS 6.0X40MM (Screw) ×3 IMPLANT
SPONGE LAP 18X18 X RAY DECT (DISPOSABLE) IMPLANT
SPONGE LAP 4X18 X RAY DECT (DISPOSABLE) IMPLANT
SPONGE SURGIFOAM ABS GEL 100 (HEMOSTASIS) ×1 IMPLANT
STAPLER VISISTAT 35W (STAPLE) IMPLANT
SURGIFLO TRUKIT (HEMOSTASIS) ×2 IMPLANT
SUT BONE WAX W31G (SUTURE) ×2 IMPLANT
SUT PROLENE 6 0 C 1 30 (SUTURE) ×1 IMPLANT
SUT VIC AB 0 CT1 27 (SUTURE) ×2
SUT VIC AB 0 CT1 27XBRD ANBCTR (SUTURE) IMPLANT
SUT VIC AB 1 CT1 27 (SUTURE) ×4
SUT VIC AB 1 CT1 27XBRD ANBCTR (SUTURE) ×2 IMPLANT
SUT VIC AB 2-0 CT1 27 (SUTURE) ×4
SUT VIC AB 2-0 CT1 TAPERPNT 27 (SUTURE) ×1 IMPLANT
SUT VICRYL 0 TIES 12 18 (SUTURE) ×1 IMPLANT
SUT VICRYL 4-0 PS2 18IN ABS (SUTURE) ×1 IMPLANT
SUT VICRYL AB 2 0 TIES (SUTURE) ×2 IMPLANT
TOWEL OR 17X24 6PK STRL BLUE (TOWEL DISPOSABLE) ×2 IMPLANT
TOWEL OR 17X26 10 PK STRL BLUE (TOWEL DISPOSABLE) ×2 IMPLANT
TRAY FOLEY CATH 16FRSI W/METER (SET/KITS/TRAYS/PACK) ×2 IMPLANT
WATER STERILE IRR 1000ML POUR (IV SOLUTION) ×1 IMPLANT
YANKAUER SUCT BULB TIP NO VENT (SUCTIONS) ×1 IMPLANT

## 2014-04-12 NOTE — Interval H&P Note (Signed)
History and Physical Interval Note:  04/12/2014 7:23 AM  [name]  has presented today for surgery, with the diagnosis of L5-S1 Spondylolysis, Spondylolisthesis, and Foraminal Stenosis  The various methods of treatment have been discussed with the patient and family. After consideration of risks, benefits and other options for treatment, the patient has consented to  Procedure(s) with comments: POSTERIOR LUMBAR FUSION 1 LEVEL (N/A) - L5-S1 TLIF (transforaminal lumbar interbody fusion), Gill Procedure, Cage, Pedicle Instrumentation, Bilateral Fusion as a surgical intervention .  The patient's history has been reviewed, patient examined, no change in status, stable for surgery.  I have reviewed the patient's chart and labs.  Questions were answered to the patient's satisfaction.     Gunnison Chahal C

## 2014-04-12 NOTE — Progress Notes (Signed)
Orthopedic Tech Progress Note Patient Details:  [name] [birth year] [id number] Called in order to [employer]. Patient ID: [name], male   DOB: 08-01-1955, 58 y.o.   MRN: [id number]   Darrol Poke 04/12/2014, 2:46 PM

## 2014-04-12 NOTE — Anesthesia Preprocedure Evaluation (Signed)
Anesthesia Evaluation  Patient identified by MRN, date of birth, ID band Patient awake    Reviewed: Allergy & Precautions, H&P , NPO status , Patient's Chart, lab work & pertinent test results  Airway Mallampati: II  Neck ROM: full    Dental   Pulmonary sleep apnea , former smoker,          Cardiovascular negative cardio ROS      Neuro/Psych Anxiety    GI/Hepatic   Endo/Other    Renal/GU      Musculoskeletal   Abdominal   Peds  Hematology   Anesthesia Other Findings   Reproductive/Obstetrics                           Anesthesia Physical Anesthesia Plan  ASA: II  Anesthesia Plan: General   Post-op Pain Management:    Induction: Intravenous  Airway Management Planned: Oral ETT  Additional Equipment:   Intra-op Plan:   Post-operative Plan: Extubation in OR  Informed Consent: I have reviewed the patients History and Physical, chart, labs and discussed the procedure including the risks, benefits and alternatives for the proposed anesthesia with the patient or authorized representative who has indicated his/her understanding and acceptance.     Plan Discussed with: CRNA, Anesthesiologist and Surgeon  Anesthesia Plan Comments:         Anesthesia Quick Evaluation

## 2014-04-12 NOTE — Progress Notes (Signed)
Utilization review completed.  

## 2014-04-12 NOTE — Transfer of Care (Signed)
Immediate Anesthesia Transfer of Care Note  Patient: [name]  Procedure(s) Performed: Procedure(s) with comments: POSTERIOR LUMBAR FUSION 1 LEVEL (N/A) - L5-S1 Gill Procedure, Pedicle Instrumentation, Bilateral Fusion  Patient Location: PACU  Anesthesia Type:General  Level of Consciousness: awake, alert , patient cooperative and responds to stimulation  Airway & Oxygen Therapy: Patient Spontanous Breathing and Patient connected to face mask oxygen  Post-op Assessment: Report given to PACU RN, Post -op Vital signs reviewed and stable and Patient moving all extremities X 4  Post vital signs: Reviewed and stable  Complications: No apparent anesthesia complications

## 2014-04-12 NOTE — Evaluation (Signed)
Physical Therapy Evaluation Patient Details Name: [name] MRN: [id number] DOB: 1955/12/02 Today's Date: 04/12/2014   History of Present Illness  pt admitted for elective 1 level lumbar fusion at L5 S1.  Clinical Impression  Pt admitted with/for elective lumbar fusion.  Pt currently limited functionally due to the problems listed below.  (see problems list.)  Pt will benefit from PT to maximize function and safety to be able to get home safely with available assist of family.     Follow Up Recommendations No PT follow up    Equipment Recommendations  None recommended by PT    Recommendations for Other Services       Precautions / Restrictions Precautions Precautions: Back Required Braces or Orthoses: Spinal Brace Spinal Brace: Lumbar corset;Applied in supine position      Mobility  Bed Mobility Overal bed mobility: Needs Assistance Bed Mobility: Rolling;Sidelying to Sit;Sit to Supine Rolling: Min assist Sidelying to sit: Min assist   Sit to supine: Min assist   General bed mobility comments: cued for best technique, logroll  Transfers Overall transfer level: Needs assistance   Transfers: Sit to/from Stand Sit to Stand: Min assist         General transfer comment: cued for hand placement and transfer safety  Ambulation/Gait Ambulation/Gait assistance: Min assist Ambulation Distance (Feet): 25 Feet Assistive device: Rolling walker (2 wheeled) Gait Pattern/deviations: Step-to pattern     General Gait Details: mildly unsteady with back pain  Stairs            Wheelchair Mobility    Modified Rankin (Stroke Patients Only)       Balance Overall balance assessment: No apparent balance deficits (not formally assessed)                                           Pertinent Vitals/Pain Pain Assessment: 0-10 Pain Score: 5  Pain Location: back incision Pain Intervention(s): Premedicated before session    Home Living  Family/patient expects to be discharged to:: Private residence Living Arrangements: Alone Available Help at Discharge: Family;Available 24 hours/day Type of Home: House Home Access: Stairs to enter Entrance Stairs-Rails: Psychiatric nurse of Steps: 5 Home Layout: One level Home Equipment: Walker - 2 wheels;Bedside commode;Shower seat      Prior Function Level of Independence: Independent               Hand Dominance        Extremity/Trunk Assessment   Upper Extremity Assessment: Overall WFL for tasks assessed           Lower Extremity Assessment: Overall WFL for tasks assessed         Communication   Communication: No difficulties  Cognition Arousal/Alertness: Awake/alert Behavior During Therapy: WFL for tasks assessed/performed Overall Cognitive Status: Within Functional Limits for tasks assessed                      General Comments General comments (skin integrity, edema, etc.): Educated pt and daughter on back care/precautions, log roll/transistion to/from sit, donning/doffing brace, lifting precautions and progression of activity.    Exercises        Assessment/Plan    PT Assessment Patient needs continued PT services  PT Diagnosis Acute pain   PT Problem List Decreased strength;Decreased activity tolerance;Decreased mobility;Decreased knowledge of use of DME;Decreased knowledge of precautions;Pain  PT Treatment Interventions DME  instruction;Gait training;Stair training;Functional mobility training;Therapeutic activities;Patient/family education   PT Goals (Current goals can be found in the Care Plan section) Acute Rehab PT Goals Patient Stated Goal: independence PT Goal Formulation: With patient Time For Goal Achievement: 04/19/14 Potential to Achieve Goals: Good    Frequency Min 5X/week   Barriers to discharge        Co-evaluation               End of Session Equipment Utilized During Treatment: Back  brace Activity Tolerance: Patient tolerated treatment well Patient left: in bed Nurse Communication: Mobility status         Time: 6712-4580 PT Time Calculation (min): 31 min   Charges:   PT Evaluation $Initial PT Evaluation Tier I: 1 Procedure PT Treatments $Gait Training: 8-22 mins $Therapeutic Activity: 8-22 mins   PT G Codes:          Virgil Slinger, Tessie Fass 04/12/2014, 5:19 PM 04/12/2014  Donnella Sham, PT [phone]  (pager)

## 2014-04-12 NOTE — Plan of Care (Signed)
Problem: Consults Goal: Diagnosis - Spinal Surgery Thoraco/Lumbar Spine Fusion     

## 2014-04-12 NOTE — Discharge Instructions (Signed)

## 2014-04-12 NOTE — Brief Op Note (Cosign Needed)
04/12/2014  12:01 PM  PATIENT:  [name]  58 y.o. male  PRE-OPERATIVE DIAGNOSIS:  L5-S1 Spondylolysis, Spondylolisthesis, and Foraminal Stenosis  POST-OPERATIVE DIAGNOSIS:  L5-S1 Spondylolysis, Spondylolisthesis, and Foraminal Stenosis  PROCEDURE:  Procedure(s) with comments: POSTERIOR LUMBAR FUSION 1 LEVEL (N/A) - L5-S1 Gill Procedure, Pedicle Instrumentation, Bilateral Fusion  SURGEON:  Surgeon(s) and Role:    * Marybelle Killings, MD - Primary  PHYSICIAN ASSISTANT: Phillips Hay [facility]  ASSISTANTS: none   ANESTHESIA:   general  EBL:  Total I/O In: 2000 [I.V.:2000] Out: 600 [Urine:300; Blood:300]  BLOOD ADMINISTERED:none  DRAINS: none   LOCAL MEDICATIONS USED:  NONE  SPECIMEN:  No Specimen  DISPOSITION OF SPECIMEN:  N/A  COUNTS:  YES  TOURNIQUET:  * No tourniquets in log *  DICTATION: .Note written in EPIC  PLAN OF CARE: Admit to inpatient   PATIENT DISPOSITION:  PACU - hemodynamically stable.   Delay start of Pharmacological VTE agent (>24hrs) due to surgical blood loss or risk of bleeding: yes

## 2014-04-13 LAB — CBC
HCT: 30.9 % — ABNORMAL LOW (ref 39.0–52.0)
Hemoglobin: 10.5 g/dL — ABNORMAL LOW (ref 13.0–17.0)
MCH: 28.7 pg (ref 26.0–34.0)
MCHC: 34 g/dL (ref 30.0–36.0)
MCV: 84.4 fL (ref 78.0–100.0)
Platelets: 196 10*3/uL (ref 150–400)
RBC: 3.66 MIL/uL — ABNORMAL LOW (ref 4.22–5.81)
RDW: 13.6 % (ref 11.5–15.5)
WBC: 7.8 10*3/uL (ref 4.0–10.5)

## 2014-04-13 LAB — BASIC METABOLIC PANEL
Anion gap: 13 (ref 5–15)
BUN: 17 mg/dL (ref 6–23)
CO2: 24 mEq/L (ref 19–32)
Calcium: 8.3 mg/dL — ABNORMAL LOW (ref 8.4–10.5)
Chloride: 101 mEq/L (ref 96–112)
Creatinine, Ser: 1.04 mg/dL (ref 0.50–1.35)
GFR calc Af Amer: >90 mL/min (ref >90)
GFR calc non Af Amer: 78 mL/min — ABNORMAL LOW (ref >90)
Glucose, Bld: 117 mg/dL — ABNORMAL HIGH (ref 70–99)
Potassium: 3.8 mEq/L (ref 3.7–5.3)
Sodium: 138 mEq/L (ref 137–147)

## 2014-04-13 NOTE — Evaluation (Signed)
Occupational Therapy Evaluation Patient Details Name: [name] MRN: [id number] DOB: [birth year] Today's Date: 04/13/2014    History of Present Illness pt admitted for elective 1 level lumbar fusion at L5 S1.   Clinical Impression   Pt admitted with the above diagnoses and presents with below problem list. Pt will benefit from continued acute OT to address the below listed deficits and maximize independence with basic ADLs prior to d/c home with family. PTA pt was independent with ADLs. Pt currently at min guard level for most ADLs.      Follow Up Recommendations  Supervision/Assistance - 24 hour;No OT follow up    Equipment Recommendations  None recommended by OT;Other (comment) (Pt has recommended DME (3n1))    Recommendations for Other Services       Precautions / Restrictions Precautions Precautions: Back Precaution Comments: pt able to state 2/3 precautions; reviewed precautions and impact on ADLs Required Braces or Orthoses: Spinal Brace Spinal Brace: Lumbar corset;Applied in supine position      Mobility Bed Mobility               General bed mobility comments: pt OOB at start of session and returned to recliner; educated on logrolling technique for bed mobility  Transfers Overall transfer level: Needs assistance Equipment used: Rolling walker (2 wheeled) (spinal brace) Transfers: Sit to/from Stand Sit to Stand: Min guard         General transfer comment: cues for technique    Balance Overall balance assessment: Needs assistance Sitting-balance support: No upper extremity supported;Feet supported Sitting balance-Leahy Scale: Good     Standing balance support: Bilateral upper extremity supported;During functional activity Standing balance-Leahy Scale: Fair                              ADL Overall ADL's : Needs assistance/impaired Eating/Feeding: Set up;Sitting   Grooming: Set up;Sitting;Standing   Upper Body Bathing: Min  guard;With adaptive equipment;Sitting   Lower Body Bathing: Min guard;With adaptive equipment;Cueing for compensatory techniques;Sit to/from stand   Upper Body Dressing : Set up;Sitting   Lower Body Dressing: Min guard;With adaptive equipment;Cueing for compensatory techniques;Sit to/from stand   Toilet Transfer: Min guard;Ambulation;RW (3n1 over toilet)   Toileting- Clothing Manipulation and Hygiene: Min guard;Sit to/from stand;With adaptive equipment   Tub/ Shower Transfer: Min guard;Ambulation;3 in 1;Rolling walker   Functional mobility during ADLs: Min guard;Rolling walker General ADL Comments: Education given on techniques and AE for safe completion of ADLs with back precautions. Pt unable to raise BLEs to functional level for LB dressing while in seated position, educated on compensatory AE (reacher) and technique. Family present during session.     Vision                     Perception     Praxis      Pertinent Vitals/Pain Pain Assessment: 0-10 Pain Score: 5  Pain Location: back Pain Descriptors / Indicators: Aching Pain Intervention(s): Limited activity within patient's tolerance;Monitored during session;Repositioned     Hand Dominance     Extremity/Trunk Assessment Upper Extremity Assessment Upper Extremity Assessment: Overall WFL for tasks assessed   Lower Extremity Assessment Lower Extremity Assessment: Defer to PT evaluation       Communication Communication Communication: No difficulties   Cognition Arousal/Alertness: Awake/alert Behavior During Therapy: WFL for tasks assessed/performed Overall Cognitive Status: Within Functional Limits for tasks assessed  General Comments       Exercises       Shoulder Instructions      Home Living Family/patient expects to be discharged to:: Private residence Living Arrangements: Alone Available Help at Discharge: Family;Available 24 hours/day Type of Home: Mobile  home Home Access: Stairs to enter Entrance Stairs-Number of Steps: 5 Entrance Stairs-Rails: Right;Left Home Layout: One level     Bathroom Shower/Tub: Occupational psychologist: Standard Bathroom Accessibility: Yes How Accessible: Accessible via walker;Other (comment) (sidestepping required with rw) Home Equipment: Walker - 2 wheels;Bedside commode;Shower seat          Prior Functioning/Environment Level of Independence: Independent             OT Diagnosis: Acute pain   OT Problem List: Impaired balance (sitting and/or standing);Decreased knowledge of use of DME or AE;Decreased knowledge of precautions;Pain   OT Treatment/Interventions: Self-care/ADL training;Therapeutic exercise;DME and/or AE instruction;Therapeutic activities;Patient/family education;Balance training    OT Goals(Current goals can be found in the care plan section) Acute Rehab OT Goals Patient Stated Goal: not stated OT Goal Formulation: With patient/family Time For Goal Achievement: 04/20/14 Potential to Achieve Goals: Good ADL Goals Pt Will Perform Lower Body Bathing: with modified independence;with adaptive equipment;sit to/from stand Pt Will Perform Lower Body Dressing: with modified independence;with adaptive equipment;sit to/from stand Pt Will Transfer to Toilet: with modified independence;ambulating (3n1 over toilet) Pt Will Perform Toileting - Clothing Manipulation and hygiene: with modified independence;with adaptive equipment;sit to/from stand Pt Will Perform Tub/Shower Transfer: with modified independence;ambulating;3 in 1;rolling walker Additional ADL Goal #1: Pt will perform logrolling technique at mod I level to prepare for OOB ADLs.  OT Frequency: Min 2X/week   Barriers to D/C:            Co-evaluation              End of Session Equipment Utilized During Treatment: Gait belt;Rolling walker;Back brace  Activity Tolerance: Patient tolerated treatment well Patient  left: in chair;with call bell/phone within reach;with family/visitor present   Time: 8916-9450 OT Time Calculation (min): 14 min Charges:  OT General Charges $OT Visit: 1 Procedure OT Evaluation $Initial OT Evaluation Tier I: 1 Procedure OT Treatments $Self Care/Home Management : 8-22 mins G-Codes:    Hortencia Pilar 09-May-2014, 12:09 PM

## 2014-04-13 NOTE — Progress Notes (Signed)
   Subjective:  Patient reports pain as mild.  No events.  Objective:   VITALS:   Filed Vitals:   04/12/14 2044 04/12/14 2300 04/13/14 0155 04/13/14 0525  BP: 116/62  101/62 121/70  Pulse: 107  73 83  Temp: 98.2 F (36.8 C)  97.6 F (36.4 C) 97.7 F (36.5 C)  TempSrc: Oral  Oral Axillary  Resp:      Height:  5\' 10"  (1.778 m)    Weight:  93.441 kg (206 lb)    SpO2: 96%  97% 99%    Neurologically intact Neurovascular intact Sensation intact distally Intact pulses distally Dorsiflexion/Plantar flexion intact Incision: dressing C/D/I and no drainage   Lab Results  Component Value Date   WBC 7.8 04/13/2014   HGB 10.5* 04/13/2014   HCT 30.9* 04/13/2014   MCV 84.4 04/13/2014   PLT 196 04/13/2014     Assessment/Plan:  1 Day Post-Op   - Expected postop acute blood loss anemia - will monitor for symptoms - Up with PT/OT - DVT ppx - SCDs, ambulation - UAT - Pain control - Discharge planning - anticipate home Sunday  Marianna Payment 04/13/2014, 7:20 AM [phone]

## 2014-04-13 NOTE — Op Note (Signed)
[name], [name]               ACCOUNT NO.:  [id number]  MEDICAL RECORD NO.:  [id number]  LOCATION:  5N22C                        FACILITY:  [facility]  PHYSICIAN:  Mark C. Lorin Mercy, M.D.    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  PROCEDURES:  L5-S1 instrumented fusion, bilateral lateral gutter fusion, Gill procedure (removal of unstable posterior elements), pedicle instrumentation, DePuy Synthes Polaris.  40-mm rods, 35 and 40-mm length, 6-mm screws.  SURGEON:  Mark C. Lorin Mercy, M.D.  ASSISTANT:  Epimenio Foot, PA-C, medically necessary and present for the entire procedure.  ANESTHESIA:  General.  EBL:  150 mL.  BLOOD:  None.  INDICATIONS:  This patient has had biforaminal stenosis progressive with progressive bilateral leg pain, weakness.  Plan was for TLIF procedure, however, interbody space was extremely tight, overgrown with spurs with not a lot of mobility.  After the Gill procedure was performed, pedicle instrumentation of bilateral anterior transverse process fusion was performed L5 to the sacrum.  DESCRIPTION OF PROCEDURE:  After standard prepping and draping with the patient on the spine table, careful padding underneath the ulnar nerves, rolled towels underneath the shoulders, Foley catheter had been placed. Back was prepped with DuraPrep.  The area was squared with towels, Betadine Steri-Drape applied.  Laminectomy sheets and drapes after Steri- Drape.  Time-out procedure completed.  Midline incision was made.  The patient had a tattoo beneath the Pugh holding a balloon exactly in the midline and  subperiosteal dissection was performed.  There was only micromotion at the L5-S1 interspace and preoperative MRI showed large amount of fibrous material cartilage at the spondylolysis causing severe lateral recess and foraminal stenosis.  X-ray localization after it was draped was used for confirmation and the Kocher was placed above and below the planned level for decompression below the L5-S1 interspace where there was spondylolisthesis confirming appropriate level. Posterior elements were removed.  Operative microscope was used since the dura was adherent to the L5 lamina and at the area of the spondylolysis.  There was extensive fibrous tissue.  The patient's L5 nerve root had transverse takeoff and was severely compressed overlying spurs.  Bilateral decompression was performed out laterally on the nerve on the right side.  Nerve root on the dorsal side had one piece of the small bone spur catch the top of the nerve for 1-mm split.  There was no spinal fluid leakage, this was not completely through the dura and a single suture of 6-0 Prolene was placed followed by second one through the epineurium.  The patient was Valsalva.  There was no spinal fluid leak before and after and although it could be protected with a patty, it was felt that as exposure of the disk space was directly underneath the nerve root, it was better to put a suture in the epineurium.  Disk space had overhanging spurs and C-arm had to be brought in  a couple of times trying to localize the disk space.  Once the grunge material had been removed, thick chunks of ligament decompressing the nerve root on both sides, it was obvious that even with pressure, there was minimal motion at the disk space.  When the nerve root was decompressed and the bone was removed out to the level of the pedicle and both nerve roots were free.  Pedicle instrumentation was placed on both sides with fluoroscopic visualization using the awl  checking with C-arm, using the joystick, checking with C-arm pedicle Feeler, tapping, feeling with the tiny probe, checking under C-arm with the tap in place to make sure it was in excellent position, measurement and insertion of the pedicle screw.  All screws were 35 or 40 mm.  40s were put in L5.  There was good convergence.  AP and lateral showed excellent position and alignment, 40-mm rods were selected, caps were inserted.  Due to the caps had to be replaced since they would not locking appropriately. Angle was slightly off and there was concern about possible cross- threading.  Compression was used and noted with collapsed, shifted anteriorly and anterior spurred space, there was only 1 or 2 mm, may be 1 mm of compression across the disk space.  It was tightened down, squeezed and then bone graft was added to the transverse process and sacral ala had been decorticated before placement of the screws. Switched sides with Phillips Hay, PA with rod compression and tightening the caps down.  Bone graft was packed meticulously and then both nerve roots were checked to make sure no bone graft to fallen down over the nerve root.  Dura was free, dura was intact, copious irrigation.  Fascial closure of deep layer, 2-0 Vicryl in the subcutaneous tissue and subcuticular closure, Dermabond in the skin, postop dressing and transferred to the recovery room.  Instrument count and needle count were correct.     Mark C. Lorin Mercy, M.D.     MCY/MEDQ  D:  04/12/2014  T:  04/12/2014  Job:  [id number]

## 2014-04-14 NOTE — Progress Notes (Signed)
Physical Therapy Treatment Patient Details Name: [name] MRN: [id number] DOB: [birth year] Today's Date: 04/14/2014    History of Present Illness pt admitted for elective 1 level lumbar fusion at L5 S1.    PT Comments    Pt progressing well with mobility/PT goals.  Ambulated from room<>ortho gym & completed stair training this session.  Patient safe to D/C from a mobility standpoint based on progression towards goals set on PT eval.    Follow Up Recommendations  No PT follow up     Equipment Recommendations  None recommended by PT    Recommendations for Other Services       Precautions / Restrictions Precautions Precautions: Back Precaution Comments: reviewed all 3 back precautions Required Braces or Orthoses: Spinal Brace Spinal Brace: Lumbar corset;Applied in supine position Restrictions Weight Bearing Restrictions: No    Mobility  Bed Mobility Overal bed mobility: Needs Assistance Bed Mobility: Rolling;Sidelying to Sit Rolling: Supervision Sidelying to sit: Supervision       General bed mobility comments: HOB flat without rails.  Supervision to ensure proper technique.    Transfers Overall transfer level: Needs assistance Equipment used: Rolling walker (2 wheeled) Transfers: Sit to/from Stand Sit to Stand: Supervision         General transfer comment: cues to reinforce hand placement  Ambulation/Gait Ambulation/Gait assistance: Min guard Ambulation Distance (Feet): 200 Feet Assistive device: Rolling walker (2 wheeled) Gait Pattern/deviations: Step-through pattern;Decreased stride length Gait velocity: decreased   General Gait Details: slow but steady.  cues to relax UE's   Stairs Stairs: Yes Stairs assistance: Min guard Stair Management: Two rails;Step to pattern;Forwards Number of Stairs: 3 (2x's) General stair comments: cues for technique.   Wheelchair Mobility    Modified Rankin (Stroke Patients Only)       Balance                                    Cognition Arousal/Alertness: Awake/alert Behavior During Therapy: WFL for tasks assessed/performed Overall Cognitive Status: Within Functional Limits for tasks assessed                      Exercises      General Comments        Pertinent Vitals/Pain Pain Assessment: 0-10 Pain Score: 3  Pain Location: back Pain Descriptors / Indicators: Discomfort Pain Intervention(s): Monitored during session;Premedicated before session;Repositioned    Home Living                      Prior Function            PT Goals (current goals can now be found in the care plan section) Acute Rehab PT Goals Patient Stated Goal: going home today PT Goal Formulation: With patient Time For Goal Achievement: 04/19/14 Potential to Achieve Goals: Good Progress towards PT goals: Progressing toward goals    Frequency  Min 5X/week    PT Plan Current plan remains appropriate    Co-evaluation             End of Session Equipment Utilized During Treatment: Back brace Activity Tolerance: Patient tolerated treatment well Patient left: in chair;with call bell/phone within reach     Time: 0857-0911 PT Time Calculation (min): 14 min  Charges:  $Gait Training: 8-22 mins  G Codes:      Sena Hitch 04/14/2014, 10:50 AM  Sarajane Marek, PTA [phone] 04/14/2014

## 2014-04-14 NOTE — Discharge Summary (Signed)
Physician Discharge Summary      Patient ID: [name] MRN: [id number] DOB/AGE: 1955-11-10 58 y.o.  Admit date: 04/12/2014 Discharge date: 04/14/2014  Admission Diagnoses:  Spondylolisthesis of lumbar region  Discharge Diagnoses:  Principal Problem:   Spondylolisthesis of lumbar region   Past Medical History  Diagnosis Date  . Hypercholesteremia   . Complication of anesthesia     woke up during surgery- Elbow surgery, 2009 and again with foot surgeryry   . Anxiety     some panic attacks- off medications    Surgeries: Procedure(s): POSTERIOR LUMBAR FUSION 1 LEVEL on 04/12/2014   Consultants (if any):    Discharged Condition: Improved  Hospital Course: [name] is an 58 y.o. male who was admitted 04/12/2014 with a diagnosis of Spondylolisthesis of lumbar region and went to the operating room on 04/12/2014 and underwent the above named procedures.    He was given perioperative antibiotics:      Anti-infectives   Start     Dose/Rate Route Frequency Ordered Stop   04/12/14 1600  ceFAZolin (ANCEF) IVPB 1 g/50 mL premix     1 g 100 mL/hr over 30 Minutes Intravenous Every 8 hours 04/12/14 1446 04/12/14 2351   04/12/14 0600  ceFAZolin (ANCEF) IVPB 2 g/50 mL premix     2 g 100 mL/hr over 30 Minutes Intravenous On call to O.R. 04/11/14 1406 04/12/14 0741    .  He was given sequential compression devices, early ambulation for DVT prophylaxis.  He benefited maximally from the hospital stay and there were no complications.    Recent vital signs:  Filed Vitals:   04/14/14 0536  BP: 116/61  Pulse: 94  Temp: 99.2 F (37.3 C)  Resp: 18    Recent laboratory studies:  Lab Results  Component Value Date   HGB 10.5* 04/13/2014   HGB 14.0 04/04/2014   HGB 14.2 09/05/2011   Lab Results  Component Value Date   WBC 7.8 04/13/2014   PLT 196 04/13/2014   Lab Results  Component Value Date   INR 1.05 04/04/2014   Lab Results  Component Value Date   NA 138  04/13/2014   K 3.8 04/13/2014   CL 101 04/13/2014   CO2 24 04/13/2014   BUN 17 04/13/2014   CREATININE 1.04 04/13/2014   GLUCOSE 117* 04/13/2014    Discharge Medications:     Medication List         atorvastatin 40 MG tablet  Commonly known as:  LIPITOR  Take 40 mg by mouth daily.     fenofibrate 160 MG tablet  Take 160 mg by mouth daily.     ibuprofen 800 MG tablet  Commonly known as:  ADVIL,MOTRIN  Take 800 mg by mouth daily as needed (pain).     methocarbamol 500 MG tablet  Commonly known as:  ROBAXIN  Take 1 tablet (500 mg total) by mouth every 6 (six) hours as needed for muscle spasms (spasm).     multivitamin with minerals Tabs tablet  Take 1 tablet by mouth daily.     oxyCODONE-acetaminophen 5-325 MG per tablet  Commonly known as:  ROXICET  Take 1-2 tablets by mouth every 4 (four) hours as needed.     phentermine 37.5 MG tablet  Commonly known as:  ADIPEX-P  Take 37.5 mg by mouth as needed.        Diagnostic Studies: Dg Chest 2 View  04/04/2014   CLINICAL DATA:  Preoperative chest x-ray prior to L5-S1  transforaminal lumbar interbody fusion  EXAM: CHEST  2 VIEW  COMPARISON:  Prior chest x-ray 09/05/2011  FINDINGS: The lungs are clear and negative for focal airspace consolidation or pulmonary nodule. 8 mm pulmonary nodule projecting over the right base favored to represent a prominent nipple shadow. No pleural effusion or pneumothorax. Cardiac and mediastinal contours are within normal limits. No acute fracture or lytic or blastic osseous lesions. Multilevel degenerative spurring throughout the thoracic spine.The visualized upper abdominal bowel gas pattern is unremarkable.  IMPRESSION: 1. No active cardiopulmonary disease. 2. Nodular opacity in the right lung base on the frontal view is favored to represent a prominent nipple shadow. Recommend repeat frontal chest x-ray with nipple markers in place to exclude pulmonary nodule.   Electronically Signed   By: Jacqulynn Cadet  M.D.   On: 04/04/2014 16:08   Dg Lumbar Spine 2-3 Views  04/12/2014   CLINICAL DATA:  Status post fusion  EXAM: DG C-ARM 61-120 MIN; LUMBAR SPINE - 2-3 VIEW  COMPARISON:  Lumbar MRI Nov 16, 2013  FINDINGS: Frontal and lateral views were obtained. There is pedicle screw fixation at L5 and S1 bilaterally. The screws and plates appear intact. The tips of the screws are in the respective vertebral bodies.  No fracture is apparent. There is grade I/IV anterolisthesis of L5 on S1. There is moderate disc space narrowing at L5-S1.  IMPRESSION: Postoperative fixation. Grade I/IV anterolisthesis at L5-S1, also noted on prior MR examination.   Electronically Signed   By: Lowella Grip M.D.   On: 04/12/2014 11:11   Dg C-arm 1-60 Min  04/12/2014   CLINICAL DATA:  Status post fusion  EXAM: DG C-ARM 61-120 MIN; LUMBAR SPINE - 2-3 VIEW  COMPARISON:  Lumbar MRI Nov 16, 2013  FINDINGS: Frontal and lateral views were obtained. There is pedicle screw fixation at L5 and S1 bilaterally. The screws and plates appear intact. The tips of the screws are in the respective vertebral bodies.  No fracture is apparent. There is grade I/IV anterolisthesis of L5 on S1. There is moderate disc space narrowing at L5-S1.  IMPRESSION: Postoperative fixation. Grade I/IV anterolisthesis at L5-S1, also noted on prior MR examination.   Electronically Signed   By: Lowella Grip M.D.   On: 04/12/2014 11:11    Disposition: 01-Home or Self Care  Discharge Instructions   Call MD / Call 911    Complete by:  As directed   If you experience chest pain or shortness of breath, CALL 911 and be transported to the hospital emergency room.  If you develope a fever above 101.5 F, pus (white drainage) or increased drainage or redness at the wound, or calf pain, call your surgeon's office.     Constipation Prevention    Complete by:  As directed   Drink plenty of fluids.  Prune juice may be helpful.  You may use a stool softener, such as Colace (over  the counter) 100 mg twice a day.  Use MiraLax (over the counter) for constipation as needed.     Diet - low sodium heart healthy    Complete by:  As directed      Diet general    Complete by:  As directed      Driving restrictions    Complete by:  As directed   No driving while taking narcotic pain meds.     Increase activity slowly as tolerated    Complete by:  As directed  Follow-up Information   Follow up with Marybelle Killings, MD. Schedule an appointment as soon as possible for a visit in 2 weeks.   Specialty:  Orthopedic Surgery   Contact information:   [address] [place] [address] [phone]        Signed: Marianna Payment 04/14/2014, 4:44 PM

## 2014-04-14 NOTE — Progress Notes (Signed)
Laying in bed comfortably Dressing c/d/i Exam stable Pain well controlled Patient wants to dc home today Rx in chart

## 2014-04-15 NOTE — Care Management Note (Signed)
CARE MANAGEMENT NOTE 04/15/2014  Patient:  [name], [name]   Account Number:  [id number]  Date Initiated:  04/15/2014  Documentation initiated by:  Ricki Miller  Subjective/Objective Assessment:   58 yr old male s/p L5-S1 fusion.     Action/Plan:   No home health or DME needs Identified.   Anticipated DC Date:  04/14/2014   Anticipated DC Plan:  [place]  CM consult      Choice offered to / List presented to:  NA   DME arranged  NA        HH arranged  NA      Status of service:  Completed, signed off Medicare Important Message given?   (If response is "NO", the following Medicare IM given date fields will be blank) Date Medicare IM given:   Medicare IM given by:   Date Additional Medicare IM given:   Additional Medicare IM given by:    Discharge Disposition:  HOME/SELF CARE  Per UR Regulation:  Reviewed for med. necessity/level of care/duration of stay  If discussed at [place] of Stay Meetings, dates discussed:    Comments:

## 2014-04-17 MED FILL — Heparin Sodium (Porcine) Inj 1000 Unit/ML: INTRAMUSCULAR | Qty: 30 | Status: AC

## 2014-04-17 MED FILL — Sodium Chloride IV Soln 0.9%: INTRAVENOUS | Qty: 1000 | Status: AC

## 2014-04-18 NOTE — Anesthesia Postprocedure Evaluation (Signed)
Anesthesia Post Note  Patient: [name]  Procedure(s) Performed: Procedure(s) (LRB): POSTERIOR LUMBAR FUSION 1 LEVEL (N/A)  Anesthesia type: General  Patient location: PACU  Post pain: Pain level controlled and Adequate analgesia  Post assessment: Post-op Vital signs reviewed, Patient's Cardiovascular Status Stable, Respiratory Function Stable, Patent Airway and Pain level controlled  Last Vitals:  Filed Vitals:   04/14/14 0536  BP: 116/61  Pulse: 94  Temp: 37.3 C  Resp: 18    Post vital signs: Reviewed and stable  Level of consciousness: awake, alert  and oriented  Complications: No apparent anesthesia complications

## 2014-10-10 ENCOUNTER — Ambulatory Visit (INDEPENDENT_AMBULATORY_CARE_PROVIDER_SITE_OTHER): Payer: BLUE CROSS/BLUE SHIELD | Admitting: Otolaryngology

## 2014-10-10 DIAGNOSIS — R040 Epistaxis: Secondary | ICD-10-CM | POA: Diagnosis not present

## 2014-11-14 ENCOUNTER — Ambulatory Visit (INDEPENDENT_AMBULATORY_CARE_PROVIDER_SITE_OTHER): Payer: BLUE CROSS/BLUE SHIELD | Admitting: Otolaryngology

## 2016-05-27 ENCOUNTER — Ambulatory Visit (HOSPITAL_COMMUNITY)
Admission: RE | Admit: 2016-05-27 | Discharge: 2016-05-27 | Disposition: A | Discharge status: Home / Self Care | Payer: BLUE CROSS/BLUE SHIELD | Source: Ambulatory Visit | Attending: Internal Medicine | Admitting: Internal Medicine

## 2016-05-27 ENCOUNTER — Other Ambulatory Visit (HOSPITAL_COMMUNITY): Payer: Self-pay | Admitting: Internal Medicine

## 2016-05-27 DIAGNOSIS — M79641 Pain in right hand: Secondary | ICD-10-CM | POA: Diagnosis not present

## 2016-05-27 DIAGNOSIS — M19041 Primary osteoarthritis, right hand: Secondary | ICD-10-CM | POA: Insufficient documentation

## 2017-03-28 ENCOUNTER — Telehealth: Payer: Self-pay | Admitting: Gastroenterology

## 2017-03-28 NOTE — Telephone Encounter (Signed)
Letter mailed to pt.  

## 2017-03-28 NOTE — Telephone Encounter (Signed)
RECALL FOR TCS °

## 2017-04-05 NOTE — Telephone Encounter (Signed)
Pt received letter for recall TCS. He can be reached at [phone] after 4:00pm.  Routing to DS.

## 2017-04-11 ENCOUNTER — Telehealth: Payer: Self-pay

## 2017-04-11 NOTE — Telephone Encounter (Signed)
See separate triage.  

## 2017-04-11 NOTE — Telephone Encounter (Signed)
[phone] patient received letter to schedule tcs

## 2017-04-12 NOTE — Telephone Encounter (Signed)
Gastroenterology Pre-Procedure Review  Request Date: 04/11/2017 Requesting Physician: RECALL  Last colonoscopy 04/13/2017 with Dr. Oneida Alar   PATIENT REVIEW QUESTIONS: The patient responded to the following health history questions as indicated:    1. Diabetes Melitis: no 2. Joint replacements in the past 12 months: no 3. Major health problems in the past 3 months: no 4. Has an artificial valve or MVP: no 5. Has a defibrillator: no 6. Has been advised in past to take antibiotics in advance of a procedure like teeth cleaning: no 7. Family history of colon cancer: no  8. Alcohol Use: no 9. History of sleep apnea: Was diagnosed and had c-pap/ lost weight and no longer uses it 10. History of coronary artery or other vascular stents placed within the last 12 months: no 11. History of any prior anesthesia complications: no    MEDICATIONS & ALLERGIES:    Patient reports the following regarding taking any blood thinners:   Plavix? no Aspirin? no Coumadin? no Brilinta? no Xarelto? no Eliquis? no Pradaxa? no Savaysa? no Effient? no  Patient confirms/reports the following medications:  Current Outpatient Prescriptions  Medication Sig Dispense Refill  . ALPRAZolam (XANAX) 0.25 MG tablet Take 0.25 mg by mouth at bedtime as needed for anxiety. Takes one at bedtime nightly    . atorvastatin (LIPITOR) 40 MG tablet Take 40 mg by mouth daily.    Marland Kitchen escitalopram (LEXAPRO) 20 MG tablet Take 20 mg by mouth daily.    Marland Kitchen ibuprofen (ADVIL,MOTRIN) 800 MG tablet Take 800 mg by mouth daily as needed (pain).    . Multiple Vitamin (MULTIVITAMIN WITH MINERALS) TABS tablet Take 1 tablet by mouth daily.    . phentermine (ADIPEX-P) 37.5 MG tablet Take 37.5 mg by mouth as needed.    . fenofibrate 160 MG tablet Take 160 mg by mouth daily.     No current facility-administered medications for this visit.     Patient confirms/reports the following allergies:  Allergies  Allergen Reactions  . Codeine Other  (See Comments)    Out of right state of mind.   . Meloxicam Itching    No orders of the defined types were placed in this encounter.   AUTHORIZATION INFORMATION Primary Insurance:   ID #:  Group #:  Pre-Cert / Auth required:  Pre-Cert / Auth #:   Secondary Insurance:   ID #:   Group #:  Pre-Cert / Auth required: Pre-Cert / Auth #:   SCHEDULE INFORMATION: Procedure has been scheduled as follows:  Date:  05/20/2017                Time: 11:45 AM  Location: [facility] Short Stay  This Gastroenterology Pre-Precedure Review Form is being routed to the following provider(s): Barney Drain, MD

## 2017-04-14 NOTE — Telephone Encounter (Signed)
Will need OV due to meds (Xanax and Lexapro)

## 2017-04-14 NOTE — Telephone Encounter (Signed)
LMOM to call for OV appt prior to procedure.  Taking off of the schedule for 05/20/2017.

## 2017-04-14 NOTE — Telephone Encounter (Signed)
Pt has OV with Neil Crouch, PA on 06/03/2017 at 10:30 Am.

## 2017-06-03 ENCOUNTER — Telehealth: Payer: Self-pay | Admitting: Gastroenterology

## 2017-06-03 ENCOUNTER — Ambulatory Visit: Payer: BLUE CROSS/BLUE SHIELD | Admitting: Gastroenterology

## 2017-06-03 ENCOUNTER — Encounter: Payer: Self-pay | Admitting: Gastroenterology

## 2017-06-03 NOTE — Telephone Encounter (Signed)
PATIENT WAS A NO SHOW AND LETTER SENT  °

## 2017-08-12 ENCOUNTER — Other Ambulatory Visit (HOSPITAL_COMMUNITY): Payer: Self-pay | Admitting: Internal Medicine

## 2017-08-12 ENCOUNTER — Ambulatory Visit (HOSPITAL_COMMUNITY)
Admission: RE | Admit: 2017-08-12 | Discharge: 2017-08-12 | Disposition: A | Discharge status: Home / Self Care | Payer: BLUE CROSS/BLUE SHIELD | Source: Ambulatory Visit | Attending: Internal Medicine | Admitting: Internal Medicine

## 2017-08-12 DIAGNOSIS — M25512 Pain in left shoulder: Secondary | ICD-10-CM | POA: Diagnosis present

## 2017-08-12 DIAGNOSIS — M5030 Other cervical disc degeneration, unspecified cervical region: Secondary | ICD-10-CM | POA: Diagnosis not present

## 2017-08-12 DIAGNOSIS — M25511 Pain in right shoulder: Secondary | ICD-10-CM | POA: Insufficient documentation

## 2017-08-12 DIAGNOSIS — M542 Cervicalgia: Secondary | ICD-10-CM

## 2017-08-12 DIAGNOSIS — M7989 Other specified soft tissue disorders: Secondary | ICD-10-CM | POA: Insufficient documentation

## 2017-09-06 ENCOUNTER — Other Ambulatory Visit (HOSPITAL_COMMUNITY): Payer: Self-pay | Admitting: Internal Medicine

## 2017-09-06 DIAGNOSIS — M5030 Other cervical disc degeneration, unspecified cervical region: Secondary | ICD-10-CM

## 2017-09-09 ENCOUNTER — Ambulatory Visit (HOSPITAL_COMMUNITY)
Admission: RE | Admit: 2017-09-09 | Discharge: 2017-09-09 | Disposition: A | Discharge status: Home / Self Care | Payer: BLUE CROSS/BLUE SHIELD | Source: Ambulatory Visit | Attending: Internal Medicine | Admitting: Internal Medicine

## 2017-09-09 DIAGNOSIS — M47892 Other spondylosis, cervical region: Secondary | ICD-10-CM | POA: Insufficient documentation

## 2017-09-09 DIAGNOSIS — M4803 Spinal stenosis, cervicothoracic region: Secondary | ICD-10-CM | POA: Diagnosis not present

## 2017-09-09 DIAGNOSIS — M5030 Other cervical disc degeneration, unspecified cervical region: Secondary | ICD-10-CM | POA: Diagnosis not present

## 2017-09-09 DIAGNOSIS — M4802 Spinal stenosis, cervical region: Secondary | ICD-10-CM | POA: Diagnosis not present

## 2017-10-05 ENCOUNTER — Ambulatory Visit (INDEPENDENT_AMBULATORY_CARE_PROVIDER_SITE_OTHER): Payer: BLUE CROSS/BLUE SHIELD

## 2017-10-05 ENCOUNTER — Encounter (INDEPENDENT_AMBULATORY_CARE_PROVIDER_SITE_OTHER): Payer: Self-pay | Admitting: Orthopaedic Surgery

## 2017-10-05 ENCOUNTER — Ambulatory Visit (INDEPENDENT_AMBULATORY_CARE_PROVIDER_SITE_OTHER): Payer: BLUE CROSS/BLUE SHIELD | Admitting: Orthopaedic Surgery

## 2017-10-05 VITALS — BP 109/60 | HR 68 | Ht 70.0 in | Wt 203.0 lb

## 2017-10-05 DIAGNOSIS — M9981 Other biomechanical lesions of cervical region: Secondary | ICD-10-CM

## 2017-10-05 DIAGNOSIS — M542 Cervicalgia: Secondary | ICD-10-CM

## 2017-10-05 DIAGNOSIS — M4722 Other spondylosis with radiculopathy, cervical region: Secondary | ICD-10-CM | POA: Diagnosis not present

## 2017-10-05 DIAGNOSIS — M4802 Spinal stenosis, cervical region: Secondary | ICD-10-CM

## 2017-10-05 NOTE — Progress Notes (Signed)
Office Visit Note /orthopedic consultation   Patient: [name]           Date of Birth: 03-16-1956           MRN: [id number] Visit Date: 10/05/2017              Requested by: Asencion Noble, MD [address] [place], [place] [address] PCP: Asencion Noble, MD   Assessment & Plan: Visit Diagnoses:  1. Neck pain   2. Foraminal stenosis of cervical region   3. Other spondylosis with radiculopathy, cervical region     Plan: Patient has significant spurring in the lumbar spine thoracic spine and cervical spine.  He has 2-3 mm of retrolisthesis with extension at the C3-4 with severe foraminal stenosis which is a symptomatic level.  We will proceed with physical therapy and recheck him again in 3 weeks for follow-up.  Require C3-4 anterior cervical discectomy and fusion.  Foraminal narrowing at other levels but is neurologically intact.  Thank you for the opportunity to see him in consultation  Follow-Up Instructions: Return in about 3 weeks (around 10/26/2017).   Orders:  Orders Placed This Encounter  Procedures  . XR Cervical Spine 2 or 3 views  . XR Chest 2 View  . Ambulatory referral to Physical Therapy   No orders of the defined types were placed in this encounter.     Procedures: No procedures performed   Clinical Data: No additional findings.   Subjective: Chief Complaint  Patient presents with  . Neck - Pain  . Right Shoulder - Pain  . Left Shoulder - Pain    HPI 62 year old male returns with bilateral neck pain that radiates into his shoulders.  Patient is here for consultation at the request of Dr. Willey Blade.  He is occasionally noted some symptoms into his hands left greater than right but not is been on diclofenac.  He has severe pain with extension sometimes it wakes him up at night.  He has less pain if he keeps his neck in flexion.  Lumbar L5-S1 in September 2015 doing well with good relief of pain.  He is not been on any chronic narcotic medication.  Denies  any long track signs in his legs without problems ambulating.  Cervical MRI scan 09/09/2017 showed C3-4 retrolisthesis with by foraminal stenosis and cord flattening.  He had multilevel mild narrowing at C5-6 and C6-7 as well.  There was foraminal stenosis severe on the left and moderate on the right at C7-T1.  Review of Systems to have previous left elbow surgery.  Foot bunion surgery.  2015 L5-S1 fusion doing well.  Positive for alcoholism and anxiety in the past he does not drink.  High cholesterol depression otherwise -14 point review of systems updated.  Former smoker.   Objective: Vital Signs: BP 109/60   Pulse 68   Ht 5\' 10"  (1.778 m)   Wt 203 lb (92.1 kg)   BMI 29.13 kg/m   Physical Exam  Constitutional: He is oriented to person, place, and time. He appears well-developed and well-nourished.  HENT:  Head: Normocephalic and atraumatic.  Eyes: Pupils are equal, round, and reactive to light. EOM are normal.  Neck: No tracheal deviation present. No thyromegaly present.  Cardiovascular: Normal rate.  Pulmonary/Chest: Effort normal. He has no wheezes.  Abdominal: Soft. Bowel sounds are normal.  Neurological: He is alert and oriented to person, place, and time.  Skin: Skin is warm and dry. Capillary refill takes less than 2  seconds.  Psychiatric: He has a normal mood and affect. His behavior is normal. Judgment and thought content normal.    Ortho Exam well-healed lumbar incision.  Brachial plexus tenderness both right and left.  No scapular winging.  Drop arm test right and left.  Mild tenderness over the long head of the biceps more on the left than right.  Upper extremity reflexes are 2+ and symmetrical knee and ankle jerk are intact and symmetrical.  Anterior tib gastrocsoleus heel and toe walk is normal.  No biceps triceps deltoid wrist flexion extension weakness.  He has severe pain with extension of his neck gets relief with flexion and can flex chin to chest.  Negative Lhermitte.     Specialty Comments:  No specialty comments available.  Imaging:CLINICAL DATA:  62 y/o M; neck pain extending bilaterally through the shoulders for several months. No known injury.  EXAM: MRI CERVICAL SPINE WITHOUT CONTRAST  TECHNIQUE: Multiplanar, multisequence MR imaging of the cervical spine was performed. No intravenous contrast was administered.  COMPARISON:  08/12/2017 cervical spine radiographs. 05/05/2007 cervical spine MRI.  FINDINGS: Alignment: Straightening of cervical lordosis. No listhesis. C3-4 retrolisthesis on prior cervical radiographs in the setting of relative extension is probably dynamic.  Vertebrae: No fracture, evidence of discitis, or bone lesion.  Cord: Normal signal and morphology.  Posterior Fossa, vertebral arteries, paraspinal tissues: Moderate maxillary sinus mucosal thickening.  Disc levels:  C2-3: Small central disc protrusion with ventral thecal sac effacement and minimal anterior cord flattening. No foraminal or canal stenosis.  C3-4: Disc osteophyte complex with anterior cord impingement and mild anterior cord flattening, mild canal stenosis, moderate bilateral foraminal stenosis.  C4-5: No significant disc displacement, foraminal stenosis, or canal stenosis.  C5-6: Disc osteophyte complex with prominent left-sided uncovertebral hypertrophy. Right anterior cord contact with mild flattening. Moderate left foraminal stenosis. No canal stenosis.  C6-7: Disc osteophyte complex and bilateral uncovertebral hypertrophy with mild bilateral foraminal stenosis. Anterior cord contact with mild flattening. No canal stenosis.  C7-T1: Left-greater-than-right uncovertebral hypertrophy and mild facet hypertrophy. Severe left and moderate right foraminal stenosis. No canal stenosis.  IMPRESSION: 1. No acute osseous abnormality or abnormal cord signal. 2. C3-4 retrolisthesis on prior cervical radiographs in the setting of  relative extension is probably dynamic. 3. Cervical spondylosis with prominent multilevel discogenic degenerative changes progressed from 2008. 4. Multilevel disc contact on anterior cord with mild cord flattening at C3-4, C5-6, C6-7. 5. Mild C3-4 canal stenosis. 6. Multilevel mild and moderate foraminal stenosis. Severe left C7-T1 foraminal stenosis.   Electronically Signed   By: Kristine Garbe M.D.   On: 09/09/2017 22:35     PMFS History: Patient Active Problem List   Diagnosis Date Noted  . Spondylolisthesis of lumbar region 04/12/2014  . RESTLESS LEGS SYNDROME 08/02/2009  . HYPERLIPIDEMIA 07/23/2009  . OBSTRUCTIVE SLEEP APNEA 07/23/2009   Past Medical History:  Diagnosis Date  . Anxiety    some panic attacks- off medications  . Complication of anesthesia    woke up during surgery- Elbow surgery, 2009 and again with foot surgeryry   . Hypercholesteremia     No family history on file.  Past Surgical History:  Procedure Laterality Date  . FOOT SURGERY    . left elbow      Social History   Occupational History  . Not on file  Tobacco Use  . Smoking status: Former Smoker    Years: 12.00  . Smokeless tobacco: Never Used  Substance and Sexual Activity  . Alcohol  use: No    Comment: recover alcolol 1985  . Drug use: No  . Sexual activity: Not on file    Comment: quit  1986

## 2017-10-06 ENCOUNTER — Encounter (INDEPENDENT_AMBULATORY_CARE_PROVIDER_SITE_OTHER): Payer: Self-pay | Admitting: Orthopaedic Surgery

## 2017-10-14 ENCOUNTER — Ambulatory Visit (HOSPITAL_COMMUNITY): Payer: BLUE CROSS/BLUE SHIELD | Attending: Orthopaedic Surgery

## 2017-10-14 DIAGNOSIS — M6281 Muscle weakness (generalized): Secondary | ICD-10-CM | POA: Diagnosis present

## 2017-10-14 DIAGNOSIS — M542 Cervicalgia: Secondary | ICD-10-CM | POA: Diagnosis not present

## 2017-10-14 NOTE — Therapy (Addendum)
[place], [place], [address] Phone: [phone]   Fax:  [phone]  Physical Therapy Evaluation  Patient Details  Name: [name] MRN: [id number] Date of Birth: 17-Jul-1956 Referring Provider: Rodell Perna   Encounter Date: 10/14/2017  PT End of Session - 10/14/17 1616    Visit Number  1    Number of Visits  12    Date for PT Re-Evaluation  11/25/17    Authorization Type  BCBS: Requires Reassessment every 21d; mini reassessment 11/04/17, reEval 11/25/17    Authorization Time Period  10/14/17-11/25/17  (today is visit 1 of 60 authorized visits)    PT Start Time  1521    PT Stop Time  1602    PT Time Calculation (min)  41 min    Activity Tolerance  Patient tolerated treatment well;No increased pain;Patient limited by pain    Behavior During Therapy  [facility] for tasks assessed/performed       Past Medical History:  Diagnosis Date  . Anxiety    some panic attacks- off medications  . Complication of anesthesia    woke up during surgery- Elbow surgery, 2009 and again with foot surgeryry   . Hypercholesteremia     Past Surgical History:  Procedure Laterality Date  . FOOT SURGERY    . left elbow       There were no vitals filed for this visit.   Subjective Assessment - 10/14/17 1524    Subjective  Pt reports several months ago having central neck pain, as well as difficulty with head turns, reports it seems to cause some achiness into the bilat base of the neck and shoulders. No prior history of neck pain. Inittially saw PCP who gave a an injection in the right shoulder whiich helped the pain for about 45 days. Still havign pain, had and MRI and was refered to Dr. Lorin Mercy, who reports soem abnormality with cervical discs, bone spurs, and Right shoulder abnormality. Patient has constant pain, 3/10 at best adn 10/10 at worst. 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 Pt reports  occasional paresthesia in bilat hands, adn chronic weakness in hands.     Limitations  Reading    How long can you sit comfortably?  Not limited     How long can you stand comfortably?  Not limited     How long can you walk comfortably?  Not limited     Diagnostic tests  MRI     Patient Stated Goals  decrease pain, improve ability to work withouit limitations from neck     Currently in Pain?  Yes    Pain Score  -- 4/10     Pain Location  -- left central cervico thoracic junction     Pain Descriptors / Indicators  Dull;Aching    Pain Type  Chronic pain    Pain Radiating Towards  shoulders    Pain Onset  More than a month ago    Pain Frequency  Constant    Aggravating Factors   head turns, sleeping at night with shoulder achiness bilat, lifting at work 15lb about 15x daily          [facility] PT Assessment - 10/14/17 0001      Assessment   Medical Diagnosis  Neck pain     Referring Provider  Rodell Perna    Onset Date/Surgical Date  06/08/17    Hand Dominance  Right    Next MD Visit  October 25, 2017     Prior Therapy  none      Precautions   Precautions  None pt denies diplopia, nausea, emesis, drop attackes      Balance Screen   Has the patient fallen in the past 6 months  No    Has the patient had a decrease in activity level because of a fear of falling?   No    Is the patient reluctant to leave their home because of a fear of falling?   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, prison and probation services,, lots of head tunrs, some liftin gup to 15lbs    Leisure  just works       Observation/Other Assessments   Focus on Therapeutic Outcomes (FOTO)   55 (45% impaired)       Sensation   Light Touch  Impaired Detail Left C8/T1 is impaired/abnormal       ROM / Strength   AROM / PROM / Strength  Strength;PROM;AROM      AROM   AROM Assessment Site  Cervical    Cervical  Flexion  48    Cervical Extension  33    Cervical - Right Side Bend  25    Cervical - Left Side Bend  22    Cervical - Right Rotation  55 pain at right scapula    Cervical - Left Rotation  56 centeral upper cervical area (C3ish)       Strength   Strength Assessment Site  Hand;Wrist;Elbow    Right/Left Elbow  Right;Left    Right Elbow Flexion  5/5 right shoulder pain; 4/5 adn pain with Rt IR/ER    Right Elbow Extension  5/5    Left Elbow Flexion  5/5 IR/ER: shoulder pain with rotational testing    Left Elbow Extension  4+/5 sharp elbow pain, similar to prior to ulnar nerve surgery     Right/Left hand  Right;Left    Right Hand Gross Grasp  Functional    Right Hand Grip (lbs)  80lb, 68lb    Left Hand Gross Grasp  Functional    Left Hand Grip (lbs)  60lb, 58lb      Palpation   Palpation comment  significant tenerness to Right anterior deltoid.               No data recorded  Objective measurements completed on examination: See above findings.      [facility] Adult PT Treatment/Exercise - 10/14/17 0001      Exercises   Exercises  Shoulder      Shoulder Exercises: Standing   ABduction  AROM;Both;15 reps    Shoulder ABduction Weight (lbs)  0    Retraction  AROM;Both;10 reps 10x3secH    Shoulder Elevation  AROM;10 reps 10x3secH              PT Education - 10/14/17 1612    Education provided  Yes    Education Details  exercises should not make symptoms or neck pain worse.     Person(s) Educated  Patient    Methods  Explanation;Demonstration    Comprehension  Need further instruction;Verbalized understanding       PT Short Term Goals - 10/14/17 1635      PT SHORT TERM GOAL #1  Title  In three weeks pt will deomnstrate improve cervical rotation to 65 degrees bilat.     Status  New    Target Date  11/04/17      PT SHORT TERM GOAL #2   Title  In three weeks pt will demonstrate 5/5 strength in bilat shoulder IR/ER.     Status  New    Target Date  11/04/17         PT Long Term Goals - 10/14/17 1636      PT LONG TERM GOAL #1   Title  In 6 weeks patient will demonstrate 70 degrees cervical rotation bilat.     Status  New    Target Date  11/25/17      PT LONG TERM GOAL #2   Title  In 6 weeks patient will demonstrate <10% differece in Left to right hand grip gross grasp strength.     Status  New    Target Date  11/25/17      PT LONG TERM GOAL #3   Title  In 6 weeks patient will reports Less than 3/10 pain in neck/shoulders for >3d/week.     Status  New    Target Date  11/25/17             Plan - 10/14/17 1620    Clinical Impression Statement  Pt referred to OPPT for subacute neck pain with mobility limitations. Pt also reporting intermittent shoulder pain bilat particularly at night, as well as intermittent bilat hand paresthesia at night, and weakness in grip strength. 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 Examination reveals global loss of cervical rotation and lateral flexion, BUE pain in B shoulders with rotational testing, Left paresthesia at Left C8 dermatome, abdnormal scapulothroacic posture. Grip strength is 80lb Rt, 60lb Left, tone is normal in bilat elbows. Presentation curious for Right acute musculoskeletal shoulder joint pain and Left lower cervical radicular pain, and Left upper cervical facet referral pain. Additional testing to be perfomred at subsequent visits to correlate with MRI findings in regards to potential myelopathic/discogenic disease process. Currently, no presyncopal symptoms, no diplopia, no loss of BUE fine motor control, and gait or balance changes.     History and Personal Factors relevant to plan of care:  Left Ulnar NN surgery several years ago, with full resolution of symptoms thereafter, but now returned after new onset neck pain.     Clinical Presentation  Unstable    Clinical Presentation due to:  clinical examination, tests, and measures.     Clinical Decision Making  High     Rehab Potential  Fair    Clinical Impairments Affecting Rehab Potential  Pt still working 48hours weekly with 90% of the time performing headturns     PT Frequency  2x / week    PT Duration  6 weeks    PT Treatment/Interventions  Moist Heat;Traction;Functional mobility training;Therapeutic activities;Therapeutic exercise;Patient/family education;Passive range of motion;Dry needling;Manual techniques    PT Next Visit Plan  Review HEP, treatment goals; if with PT, segmental spine testing, Supine shoulder rotational ROM testing, MFR to anterior deltoid    PT Home Exercise Plan  At eval: scap elevation, Scap restraciton, shoulder ABDCT    Consulted and Agree with Plan of Care  Patient       Patient will benefit from skilled therapeutic intervention in order to improve the following deficits and impairments:  Hypomobility, Decreased knowledge of precautions, Pain, Decreased activity tolerance, Decreased strength, Impaired UE  functional use, Increased muscle spasms, Decreased range of motion, Improper body mechanics, Postural dysfunction  Visit Diagnosis: Cervicalgia - Plan: PT plan of care cert/re-cert  Muscle weakness (generalized) - Plan: PT plan of care cert/re-cert     Problem List Patient Active Problem List   Diagnosis Date Noted  . Spondylolisthesis of lumbar region 04/12/2014  . RESTLESS LEGS SYNDROME 08/02/2009  . HYPERLIPIDEMIA 07/23/2009  . OBSTRUCTIVE SLEEP APNEA 07/23/2009    4:41 PM, 10/14/17 Etta Grandchild, PT, DPT Physical Therapist at [place] [phone] (office)      Etta Grandchild 10/14/2017, 4:40 PM  [place] [address] [place], [place], [address] Phone: [phone]   Fax:  [phone]  Name: [name] MRN: [id number] Date of Birth: [birth year]    Addendum on 10/18/17 to update third party payer information.  5:20 PM, 10/18/17 Etta Grandchild, PT, DPT Physical  Therapist at [place] [phone] (office)

## 2017-10-19 ENCOUNTER — Ambulatory Visit (HOSPITAL_COMMUNITY): Payer: BLUE CROSS/BLUE SHIELD | Attending: Orthopaedic Surgery

## 2017-10-19 ENCOUNTER — Encounter (HOSPITAL_COMMUNITY): Payer: Self-pay

## 2017-10-19 DIAGNOSIS — M6281 Muscle weakness (generalized): Secondary | ICD-10-CM | POA: Diagnosis present

## 2017-10-19 DIAGNOSIS — M542 Cervicalgia: Secondary | ICD-10-CM | POA: Diagnosis present

## 2017-10-19 NOTE — Therapy (Signed)
[place], [place], [address] Phone: [phone]   Fax:  [phone]  Physical Therapy Treatment  Patient Details  Name: [name] MRN: [id number] Date of Birth: 04-20-1956 Referring Provider: Rodell Perna   Encounter Date: 10/19/2017  PT End of Session - 10/19/17 1528    Visit Number  2    Number of Visits  12    Date for PT Re-Evaluation  11/25/17 mini-reassess 11/04/17    Authorization Type  BCBS: Requires Reassessment every 21d    Authorization Time Period  10/14/17-11/25/17    Authorization - Visit Number  2    Authorization - Number of Visits  60    PT Start Time  1520    PT Stop Time  1558    PT Time Calculation (min)  38 min    Activity Tolerance  Patient tolerated treatment well;No increased pain    Behavior During Therapy  WFL for tasks assessed/performed       Past Medical History:  Diagnosis Date  . Anxiety    some panic attacks- off medications  . Complication of anesthesia    woke up during surgery- Elbow surgery, 2009 and again with foot surgeryry   . Hypercholesteremia     Past Surgical History:  Procedure Laterality Date  . FOOT SURGERY    . left elbow       There were no vitals filed for this visit.  Subjective Assessment - 10/19/17 1525    Subjective  Pt stated he has some neck pain today, pain scale 6/10 Rt >Lt side.    Patient Stated Goals  decrease pain, improve ability to work withouit limitations from neck     Currently in Pain?  Yes    Pain Score  6     Pain Location  Neck Out to anterior shoulder Rt > Lt    Pain Orientation  Right;Left    Pain Descriptors / Indicators  Dull;Aching    Pain Type  Chronic pain    Pain Onset  More than a month ago    Pain Frequency  Constant    Aggravating Factors   head turns, sleeping at night with shoulder achiness Bil, lifting at work 15lb about 15x daily    Pain Relieving Factors  resting    Effect of Pain on Daily Activities   decreases ability/safety with work due to inability to rotate head         [facility] PT Assessment - 10/19/17 0001      ROM / Strength   AROM / PROM / Strength  AROM      AROM   AROM Assessment Site  Shoulder    Right/Left Shoulder  Right;Left    Right Shoulder Flexion  140 Degrees pain at 98 degrees    Right Shoulder ABduction  174 Degrees painful    Right Shoulder Internal Rotation  38 Degrees painful    Right Shoulder External Rotation  60 Degrees    Left Shoulder Flexion  155 Degrees    Left Shoulder ABduction  170 Degrees    Left Shoulder Internal Rotation  30 Degrees pain anterior shoulder    Left Shoulder External Rotation  60 Degrees                   [facility] Adult PT Treatment/Exercise - 10/19/17 0001      Shoulder Exercises: Seated   Other Seated Exercises  3D cervical excursion (no side bend) pain  free range 10x      Shoulder Exercises: Standing   ABduction  AROM;Both;15 reps    Retraction  AROM;Both;10 reps    Shoulder Elevation  AROM;10 reps      Manual Therapy   Manual Therapy  Myofascial release    Manual therapy comments  manual complete separate than rest of tx    Myofascial Release  MFR to anterior deltoids, pec major/minor, upper traps and posterior cervical mm in supine position               PT Short Term Goals - 10/14/17 1635      PT SHORT TERM GOAL #1   Title  In three weeks pt will deomnstrate improve cervical rotation to 65 degrees bilat.     Status  New    Target Date  11/04/17      PT SHORT TERM GOAL #2   Title  In three weeks pt will demonstrate 5/5 strength in bilat shoulder IR/ER.     Status  New    Target Date  11/04/17        PT Long Term Goals - 10/14/17 1636      PT LONG TERM GOAL #1   Title  In 6 weeks patient will demonstrate 70 degrees cervical rotation bilat.     Status  New    Target Date  11/25/17      PT LONG TERM GOAL #2   Title  In 6 weeks patient will demonstrate <10% differece in Left to right  hand grip gross grasp strength.     Status  New    Target Date  11/25/17      PT LONG TERM GOAL #3   Title  In 6 weeks patient will reports Less than 3/10 pain in neck/shoulders for >3d/week.     Status  New    Target Date  11/25/17            Plan - 10/19/17 2013    Clinical Impression Statement  Reviewed goals, assured compliance and proper form/technqiues iwht HEP and copy of eval given to pt.  Shoulder ROM assessed (see above).  Session focus on cervical mobility and pain control.  EOS with manual soft tissue and myofascial release techniques to address restrictions, pt with several trigger points on pec minor insertion, anterior deltiod tightness and tenderness on Lt>Rt suboccipital region.  EOS pt reports decrease in pain to 3/10 and increase ease with cervical movements.      Rehab Potential  Fair    Clinical Impairments Affecting Rehab Potential  Pt still working 48hours weekly with 90% of the time performing headturns     PT Frequency  2x / week    PT Duration  6 weeks    PT Treatment/Interventions  Moist Heat;Traction;Functional mobility training;Therapeutic activities;Therapeutic exercise;Patient/family education;Passive range of motion;Dry needling;Manual techniques    PT Next Visit Plan  Next session if with PT, segmental spine testing, MFR to anterior deltoid.  Session focus on cervical and shoulder ROM, postural strengthening and grip strengthening.    PT Home Exercise Plan  At eval: scap elevation, Scap restraciton, shoulder ABDCT       Patient will benefit from skilled therapeutic intervention in order to improve the following deficits and impairments:  Hypomobility, Decreased knowledge of precautions, Pain, Decreased activity tolerance, Decreased strength, Impaired UE functional use, Increased muscle spasms, Decreased range of motion, Improper body mechanics, Postural dysfunction  Visit Diagnosis: Cervicalgia  Muscle weakness (generalized)  Problem  List Patient Active Problem List   Diagnosis Date Noted  . Spondylolisthesis of lumbar region 04/12/2014  . RESTLESS LEGS SYNDROME 08/02/2009  . HYPERLIPIDEMIA 07/23/2009  . OBSTRUCTIVE SLEEP APNEA 07/23/2009   Ihor Austin, LPTA; [place]  Aldona Lento 10/19/2017, 8:19 PM  [place], [place], [address] Phone: [phone]   Fax:  [phone]  Name: [name] MRN: [id number] Date of Birth: 06-08-1956

## 2017-10-24 ENCOUNTER — Ambulatory Visit (HOSPITAL_COMMUNITY): Payer: BLUE CROSS/BLUE SHIELD

## 2017-10-24 DIAGNOSIS — M6281 Muscle weakness (generalized): Secondary | ICD-10-CM

## 2017-10-24 DIAGNOSIS — M542 Cervicalgia: Secondary | ICD-10-CM

## 2017-10-24 NOTE — Therapy (Signed)
[place], [place], [address] Phone: [phone]   Fax:  [phone]  Physical Therapy Treatment  Patient Details  Name: [name] MRN: [id number] Date of Birth: [birth year] Referring Provider: Rodell Perna   Encounter Date: 10/24/2017  PT End of Session - 10/24/17 1615    Visit Number  3    Number of Visits  12    Date for PT Re-Evaluation  11/25/17    Authorization Type  BCBS: Requires Reassessment every 21d    Authorization Time Period  10/14/17-11/25/17 (reassessment 11/04/17)    Authorization - Visit Number  3    Authorization - Number of Visits  60    PT Start Time  1602    PT Stop Time  1642    PT Time Calculation (min)  40 min    Activity Tolerance  Patient tolerated treatment well;No increased pain    Behavior During Therapy  WFL for tasks assessed/performed       Past Medical History:  Diagnosis Date  . Anxiety    some panic attacks- off medications  . Complication of anesthesia    woke up during surgery- Elbow surgery, 2009 and again with foot surgeryry   . Hypercholesteremia     Past Surgical History:  Procedure Laterality Date  . FOOT SURGERY    . left elbow       There were no vitals filed for this visit.  Subjective Assessment - 10/24/17 1607    Subjective  Pt reports he enjoyed last session. He couldnot percieve any longterm relief from soft tissue work last session. He says in terms of pain, today is a good day, pain is about 5/10.     Currently in Pain?  Yes    Pain Score  5     Pain Location  Neck                       [facility] Adult PT Treatment/Exercise - 10/24/17 0001      Exercises   Exercises  Neck      Neck Exercises: Standing   Neck Retraction  15 reps;3 secs 2 pillows, VC for avoidance of cerv extension    Other Standing Exercises  Supine Goal Post Stretch,, towel under T-spine:  stretch but no pain: 1x5 minutes      Neck Exercises: Seated   W Back   15 reps 15x5sec H       Neck Exercises: Supine   Capital Flexion  10 reps;5 secs isometric holds with 1cm lift    Cervical Rotation  10 reps bilat, without pain free range      Shoulder Exercises: Standing   ABduction  Both;10 reps;Strengthening;Weights    Shoulder ABduction Weight (lbs)  2lb dumbbells    Shoulder Elevation  AROM;10 reps 10x5sec, 2lb dumbbells, fewer symptoms with retraction      Manual Therapy   Myofascial Release  Rt anteriro deltoid, Rt pec Minor: 15 minutes very painful               PT Short Term Goals - 10/14/17 1635      PT SHORT TERM GOAL #1   Title  In three weeks pt will deomnstrate improve cervical rotation to 65 degrees bilat.     Status  New    Target Date  11/04/17      PT SHORT TERM GOAL #2   Title  In three weeks pt will demonstrate  5/5 strength in bilat shoulder IR/ER.     Status  New    Target Date  11/04/17        PT Long Term Goals - 10/14/17 1636      PT LONG TERM GOAL #1   Title  In 6 weeks patient will demonstrate 70 degrees cervical rotation bilat.     Status  New    Target Date  11/25/17      PT LONG TERM GOAL #2   Title  In 6 weeks patient will demonstrate <10% differece in Left to right hand grip gross grasp strength.     Status  New    Target Date  11/25/17      PT LONG TERM GOAL #3   Title  In 6 weeks patient will reports Less than 3/10 pain in neck/shoulders for >3d/week.     Status  New    Target Date  11/25/17            Plan - 10/24/17 1617    Clinical Impression Statement  Pt doign well today. Continued to progress stretching, postural strengthenign ang gentle ROM inthe cervical spine. Pt continues to have radicular symptoms with large amplitude movementsbut is able to avoid with improvedmotor control adn verbal cues. Noted MFR to Anterior shoulder feels questionable for chronic muscle tear, very painful, endorsed as same stretch pain as with scap retraction.     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  Session focus on cervical and shoulder ROM, postural strengthening and grip strengthening.    PT Home Exercise Plan  At eval: scap elevation, Scap restraciton, shoulder ABDCT    Consulted and Agree with Plan of Care  Patient       Patient will benefit from skilled therapeutic intervention in order to improve the following deficits and impairments:  Hypomobility, Decreased knowledge of precautions, Pain, Decreased activity tolerance, Decreased strength, Impaired UE functional use, Increased muscle spasms, Decreased range of motion, Improper body mechanics, Postural dysfunction  Visit Diagnosis: Cervicalgia  Muscle weakness (generalized)     Problem List Patient Active Problem List   Diagnosis Date Noted  . Spondylolisthesis of lumbar region 04/12/2014  . RESTLESS LEGS SYNDROME 08/02/2009  . HYPERLIPIDEMIA 07/23/2009  . OBSTRUCTIVE SLEEP APNEA 07/23/2009   4:46 PM, 10/24/17 Etta Grandchild, PT, DPT Physical Therapist at [place] [phone] (office)      Etta Grandchild 10/24/2017, 4:46 PM  [facility] [address] [place], [place], [address] Phone: [phone]   Fax:  [phone]  Name: [name] MRN: [id number] Date of Birth: [birth year]

## 2017-10-25 ENCOUNTER — Ambulatory Visit (INDEPENDENT_AMBULATORY_CARE_PROVIDER_SITE_OTHER): Payer: BLUE CROSS/BLUE SHIELD | Admitting: Orthopaedic Surgery

## 2017-10-27 ENCOUNTER — Ambulatory Visit (HOSPITAL_COMMUNITY): Payer: BLUE CROSS/BLUE SHIELD

## 2017-10-27 DIAGNOSIS — M542 Cervicalgia: Secondary | ICD-10-CM | POA: Diagnosis not present

## 2017-10-27 DIAGNOSIS — M6281 Muscle weakness (generalized): Secondary | ICD-10-CM

## 2017-10-27 NOTE — Therapy (Signed)
[place], [place], [address] Phone: [phone]   Fax:  [phone]  Physical Therapy Treatment  Patient Details  Name: [name] MRN: [id number] Date of Birth: 05-Mar-1956 Referring Provider: Rodell Perna   Encounter Date: 10/27/2017  PT End of Session - 10/27/17 1528    Visit Number  4    Number of Visits  12    Date for PT Re-Evaluation  11/25/17    Authorization Type  BCBS: Requires Reassessment every 21d    Authorization Time Period  10/14/17-11/25/17 (reassessment 11/04/17)    Authorization - Visit Number  4    Authorization - Number of Visits  60    PT Start Time  9381    PT Stop Time  0175    PT Time Calculation (min)  40 min    Activity Tolerance  Patient tolerated treatment well;No increased pain    Behavior During Therapy  WFL for tasks assessed/performed       Past Medical History:  Diagnosis Date  . Anxiety    some panic attacks- off medications  . Complication of anesthesia    woke up during surgery- Elbow surgery, 2009 and again with foot surgeryry   . Hypercholesteremia     Past Surgical History:  Procedure Laterality Date  . FOOT SURGERY    . left elbow       There were no vitals filed for this visit.  Subjective Assessment - 10/27/17 1525    Subjective  Pt report shes doing ok today. Pain a little better than typical. His Rt shoulder flet a bit improved after last session. Most of his pain today is in the posterio upper neck. HEP goign well 2x daily.     Currently in Pain?  Yes    Pain Score  4     Pain Location  -- upper posterio neck                       [facility] Adult PT Treatment/Exercise - 10/27/17 0001      Neck Exercises: Supine   Capital Flexion  10 reps;5 secs 3x10 isometric holds with 1cm lift      Neck Exercises: Prone   Neck Retraction  15 reps in qudruped from neck flexion    Other Prone Exercise  Quadruped Cervical rotation in retraction  2x10  bilat      Shoulder Exercises: Supine   Other Supine Exercises  Rt shoulder fleixon 0-90 degrees  1x15 s/p MFR      Shoulder Exercises: Standing   External Rotation  10 reps;Both;Theraband 3x10c green TB    ABduction  Both;Strengthening;Weights;15 reps 3x15    Shoulder ABduction Weight (lbs)  2lb dumbbells    Shoulder Elevation  AROM;10 reps 3x15x3sec, 10lb dumbbells, retraction to neutral      Manual Therapy   Myofascial Release  Rt anteriro deltoid, 10 minutes moderately painful, better relase than last time               PT Short Term Goals - 10/14/17 1635      PT SHORT TERM GOAL #1   Title  In three weeks pt will deomnstrate improve cervical rotation to 65 degrees bilat.     Status  New    Target Date  11/04/17      PT SHORT TERM GOAL #2   Title  In three weeks pt will demonstrate 5/5 strength in bilat shoulder IR/ER.  Status  New    Target Date  11/04/17        PT Long Term Goals - 10/14/17 1636      PT LONG TERM GOAL #1   Title  In 6 weeks patient will demonstrate 70 degrees cervical rotation bilat.     Status  New    Target Date  11/25/17      PT LONG TERM GOAL #2   Title  In 6 weeks patient will demonstrate <10% differece in Left to right hand grip gross grasp strength.     Status  New    Target Date  11/25/17      PT LONG TERM GOAL #3   Title  In 6 weeks patient will reports Less than 3/10 pain in neck/shoulders for >3d/week.     Status  New    Target Date  11/25/17            Plan - 10/27/17 1529    Clinical Impression Statement  Continued to proceed with postural scapular strengtheing, emphasis on neutral cervical posture without exacerbation of symptoms. Pt tolerating session well. 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  Session focus on cervical and shoulder ROM, postural strengthening and grip strengthening.    PT Home Exercise Plan  At eval: scap elevation, Scap restraciton, shoulder ABDCT    Consulted and Agree with Plan of Care  Patient       Patient will benefit from skilled therapeutic intervention in order to improve the following deficits and impairments:  Hypomobility, Decreased knowledge of precautions, Pain, Decreased activity tolerance, Decreased strength, Impaired UE functional use, Increased muscle spasms, Decreased range of motion, Improper body mechanics, Postural dysfunction  Visit Diagnosis: Cervicalgia  Muscle weakness (generalized)     Problem List Patient Active Problem List   Diagnosis Date Noted  . Spondylolisthesis of lumbar region 04/12/2014  . RESTLESS LEGS SYNDROME 08/02/2009  . HYPERLIPIDEMIA 07/23/2009  . OBSTRUCTIVE SLEEP APNEA 07/23/2009   4:05 PM, 10/27/17 Etta Grandchild, PT, DPT Physical Therapist at [place] [phone] (office)      Etta Grandchild 10/27/2017, 4:05 PM  [facility] [place], [place], [address] Phone: [phone]   Fax:  [phone]  Name: DUSTIN BUMBAUGH MRN: [id number] Date of Birth: 03-Oct-1955

## 2017-10-31 ENCOUNTER — Telehealth (HOSPITAL_COMMUNITY): Payer: Self-pay

## 2017-10-31 ENCOUNTER — Ambulatory Visit (HOSPITAL_COMMUNITY): Payer: BLUE CROSS/BLUE SHIELD

## 2017-10-31 NOTE — Telephone Encounter (Signed)
Left message for patient 30 minutes after not showing up for scheduled appointment.   4:35 PM, 10/31/17 Etta Grandchild, PT, DPT Physical Therapist at [place] [phone] (office)

## 2017-10-31 NOTE — Telephone Encounter (Signed)
Pt forgt about this apptment he called to say Sorry! NF 4:45pm

## 2017-11-03 ENCOUNTER — Encounter (HOSPITAL_COMMUNITY): Payer: Self-pay

## 2017-11-03 ENCOUNTER — Ambulatory Visit (HOSPITAL_COMMUNITY): Payer: BLUE CROSS/BLUE SHIELD

## 2017-11-03 DIAGNOSIS — M542 Cervicalgia: Secondary | ICD-10-CM | POA: Diagnosis not present

## 2017-11-03 DIAGNOSIS — M6281 Muscle weakness (generalized): Secondary | ICD-10-CM

## 2017-11-03 NOTE — Therapy (Signed)
[place], [place], [address] Phone: [phone]   Fax:  [phone]  Physical Therapy Treatment  Patient Details  Name: [name] MRN: [id number] Date of Birth: [birth year] Referring Provider: Rodell Perna   Encounter Date: 11/03/2017  PT End of Session - 11/03/17 1529    Visit Number  5    Number of Visits  12    Date for PT Re-Evaluation  11/25/17 Reassess 11/04/17    Authorization Type  BCBS: Requires Reassessment every 21d    Authorization Time Period  10/14/17-11/25/17 (reassessment 11/04/17)    Authorization - Visit Number  5    Authorization - Number of Visits  60    PT Start Time  2353    PT Stop Time  1605    PT Time Calculation (min)  42 min    Activity Tolerance  Patient tolerated treatment well;No increased pain    Behavior During Therapy  WFL for tasks assessed/performed       Past Medical History:  Diagnosis Date  . Anxiety    some panic attacks- off medications  . Complication of anesthesia    woke up during surgery- Elbow surgery, 2009 and again with foot surgeryry   . Hypercholesteremia     Past Surgical History:  Procedure Laterality Date  . FOOT SURGERY    . left elbow       There were no vitals filed for this visit.  Subjective Assessment - 11/03/17 1525    Subjective  Pt stated he is doing well today, most pain on either side of posterioer neck, most pain when relaxed.  Reports complaince with HEP daily.    Patient Stated Goals  decrease pain, improve ability to work withouit limitations from neck     Currently in Pain?  Yes    Pain Score  4     Pain Location  Neck    Pain Orientation  Posterior;Right;Left    Pain Descriptors / Indicators  Dull    Pain Type  Chronic pain    Pain Onset  More than a month ago    Pain Frequency  Constant    Aggravating Factors   head turns, sleeping at night with shoulder achiness BIl, lifting at work 15lb about 15x daily    Pain Relieving  Factors  resting    Effect of Pain on Daily Activities  decreases ability/safety wiht work due to inability to rotate head                       [facility] Adult PT Treatment/Exercise - 11/03/17 0001      Neck Exercises: Standing   Neck Retraction  15 reps;3 secs    Upper Extremity Flexion with Stabilization  Flexion;10 reps infront of wall with retraction      Neck Exercises: Supine   Capital Flexion  10 reps;5 secs isometric holds with 1cm lift      Neck Exercises: Prone   Neck Retraction  15 reps in quadruped positoin    Other Prone Exercise  Quadruped Cervical rotation in retraction 15x      Shoulder Exercises: Standing   External Rotation  15 reps;Theraband    Theraband Level (Shoulder External Rotation)  Level 3 (Green)    ABduction  Both;Strengthening;Weights;15 reps    Shoulder ABduction Weight (lbs)  2lb dumbbells    Extension  15 reps;Theraband    Theraband Level (Shoulder Extension)  Level 3 (Green)  Row  15 reps;Theraband    Theraband Level (Shoulder Row)  Level 3 (Green)    Shoulder Elevation  AROM;15 reps 10# bring to neutral then retract for 3"      Manual Therapy   Manual Therapy  Myofascial release    Manual therapy comments  manual complete separate than rest of tx    Myofascial Release  Rt anteriro deltoid and pec minor supine with LE elevated               PT Short Term Goals - 10/14/17 1635      PT SHORT TERM GOAL #1   Title  In three weeks pt will deomnstrate improve cervical rotation to 65 degrees bilat.     Status  New    Target Date  11/04/17      PT SHORT TERM GOAL #2   Title  In three weeks pt will demonstrate 5/5 strength in bilat shoulder IR/ER.     Status  New    Target Date  11/04/17        PT Long Term Goals - 10/14/17 1636      PT LONG TERM GOAL #1   Title  In 6 weeks patient will demonstrate 70 degrees cervical rotation bilat.     Status  New    Target Date  11/25/17      PT LONG TERM GOAL #2   Title  In  6 weeks patient will demonstrate <10% differece in Left to right hand grip gross grasp strength.     Status  New    Target Date  11/25/17      PT LONG TERM GOAL #3   Title  In 6 weeks patient will reports Less than 3/10 pain in neck/shoulders for >3d/week.     Status  New    Target Date  11/25/17            Plan - 11/03/17 1608    Clinical Impression Statement  Session focus on postural strengthening and emphasis of awareness of cervical posture at neutral.  Pt presents with improved shoulder and some cervical mobility.  EOS with myofascial release techniques to address painful spasm on anterior tib.  Passive pec stretch to address tightness wiht reports of relief following manual.      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  Next session if with PT, segmental spine testing, MFR to anterior deltoid.  Session focus on cervical and shoulder ROM, postural strengthening and grip strengthening.    PT Home Exercise Plan  At eval: scap elevation, Scap restraciton, shoulder ABDCT       Patient will benefit from skilled therapeutic intervention in order to improve the following deficits and impairments:  Hypomobility, Decreased knowledge of precautions, Pain, Decreased activity tolerance, Decreased strength, Impaired UE functional use, Increased muscle spasms, Decreased range of motion, Improper body mechanics, Postural dysfunction  Visit Diagnosis: Cervicalgia  Muscle weakness (generalized)     Problem List Patient Active Problem List   Diagnosis Date Noted  . Spondylolisthesis of  lumbar region 04/12/2014  . RESTLESS LEGS SYNDROME 08/02/2009  . HYPERLIPIDEMIA 07/23/2009  .  OBSTRUCTIVE SLEEP APNEA 07/23/2009   Ihor Austin, LPTA; [employer]  Aldona Lento 11/03/2017, 4:16 PM  [place], [place], [address] Phone: [phone]   Fax:  [phone]  Name: BRADSHAW MINIHAN MRN: [id number] Date of Birth: 1956/06/12

## 2017-11-07 ENCOUNTER — Ambulatory Visit (HOSPITAL_COMMUNITY): Payer: BLUE CROSS/BLUE SHIELD

## 2017-11-08 ENCOUNTER — Other Ambulatory Visit: Payer: Self-pay

## 2017-11-08 ENCOUNTER — Encounter (HOSPITAL_COMMUNITY): Payer: Self-pay

## 2017-11-08 ENCOUNTER — Ambulatory Visit (HOSPITAL_COMMUNITY): Payer: BLUE CROSS/BLUE SHIELD

## 2017-11-08 DIAGNOSIS — M542 Cervicalgia: Secondary | ICD-10-CM | POA: Diagnosis not present

## 2017-11-08 DIAGNOSIS — M6281 Muscle weakness (generalized): Secondary | ICD-10-CM

## 2017-11-08 NOTE — Therapy (Signed)
[place], [place], [address] Phone: [phone]   Fax:  [phone]  Physical Therapy Treatment/Re-assessment  Patient Details  Name: [name] MRN: [id number] Date of Birth: 1955-08-14 Referring Provider: Mariann Laster   Encounter Date: 11/08/2017   Progress Note Reporting Period 10/14/17 to 11/08/17  See note below for Objective Data and Assessment of Progress/Goals.      PT End of Session - 11/08/17 1527    Visit Number  6    Number of Visits  12    Date for PT Re-Evaluation  11/25/17 Reassess 11/04/17    Authorization Type  BCBS: (Re-Eval payable every 21 visit)    Authorization Time Period  10/14/17-11/25/17 (reassessment 11/04/17)    Authorization - Visit Number  6    Authorization - Number of Visits  60    PT Start Time  6962    PT Stop Time  1603    PT Time Calculation (min)  40 min    Activity Tolerance  Patient tolerated treatment well;No increased pain    Behavior During Therapy  WFL for tasks assessed/performed       Past Medical History:  Diagnosis Date  . Anxiety    some panic attacks- off medications  . Complication of anesthesia    woke up during surgery- Elbow surgery, 2009 and again with foot surgeryry   . Hypercholesteremia     Past Surgical History:  Procedure Laterality Date  . FOOT SURGERY    . left elbow       There were no vitals filed for this visit.  Subjective Assessment - 11/08/17 1525    Subjective  Patient continues to report good compliance with HEP and states he is doing his exercises 2x daily.     Limitations  Reading    How long can you sit comfortably?  Not limited     How long can you stand comfortably?  Not limited     How long can you walk comfortably?  Not limited     Diagnostic tests  MRI     Patient Stated Goals  decrease pain, improve ability to work withouit limitations from neck     Currently in Pain?  Yes    Pain Score  4     Pain Location  Neck     Pain Orientation  Left;Right;Posterior    Pain Descriptors / Indicators  Aching;Dull    Pain Type  Chronic pain    Pain Onset  More than a month ago    Pain Frequency  Constant         [facility] PT Assessment - 11/08/17 0001      Assessment   Medical Diagnosis  Neck pain     Referring Provider  Mrk Yates    Onset Date/Surgical Date  06/08/17    Hand Dominance  Right    Next MD Visit  11/15/17    Prior Therapy  none      Observation/Other Assessments   Focus on Therapeutic Outcomes (FOTO)   42% limited (was 45% limited on 10/14/17)      AROM   Cervical Flexion  60    Cervical Extension  45    Cervical - Right Side Bend  30    Cervical - Left Side Bend  25    Cervical - Right Rotation  60    Cervical - Left Rotation  55      Strength   Right Hand  Grip (lbs)  80 lbs, 74 lbs    Left Hand Grip (lbs)  60 lbs, 48 lbs       [facility] Adult PT Treatment/Exercise - 11/08/17 0001      Neck Exercises: Machines for Strengthening   UBE (Upper Arm Bike)  4 mins retrograde on Level 1      Neck Exercises: Seated   Neck Retraction  20 reps;3 secs      Shoulder Exercises: Standing   External Rotation  Both;15 reps;Theraband;Limitations    Theraband Level (Shoulder External Rotation)  Level 3 (Green)    External Rotation Limitations  2 sets, towel between elbow and body for comfort and positioning    Extension  15 reps;Theraband;Limitations;Both    Theraband Level (Shoulder Extension)  Level 3 (Green)    Extension Limitations  2 sets    Row  15 reps;Theraband;Limitations;Both    Theraband Level (Shoulder Row)  Level 3 (Green)    Row Limitations  2 sets      Manual Therapy   Manual Therapy  Joint mobilization    Manual therapy comments  manual complete separate than rest of tx    Joint Mobilization  PA's to C/T spine C2-T6 to assess mobility; 3x 30-45 seconds for grade II for C7-T6 to reduce pain and improve mobility         PT Education - 11/08/17 1557    Education provided  Yes     Education Details  Educated on exercise throughout and progress in therapy towards goals    Person(s) Educated  Patient    Methods  Explanation    Comprehension  Verbalized understanding       PT Short Term Goals - 11/08/17 1527      PT SHORT TERM GOAL #1   Title  In three weeks pt will deomnstrate improve cervical rotation to 65 degrees bilat.     Status  On-going      PT SHORT TERM GOAL #2   Title  In three weeks pt will demonstrate 5/5 strength in bilat shoulder IR/ER.     Status  Achieved        PT Long Term Goals - 11/08/17 1528      PT LONG TERM GOAL #1   Title  In 6 weeks patient will demonstrate 70 degrees cervical rotation bilat.     Status  On-going      PT LONG TERM GOAL #2   Title  In 6 weeks patient will demonstrate <10% differece in Left to right hand grip gross grasp strength.     Status  On-going      PT LONG TERM GOAL #3   Title  In 6 weeks patient will reports Less than 3/10 pain in neck/shoulders for >3d/week.     Baseline  11/08/17 - every morning about 6/10    Status  On-going        Plan - 11/08/17 1611    Clinical Impression Statement  Re-assessment performed today and patient has made some progress towards short term goals and long term goals but continues to have limited cervical rotation bilaterally. He has made some improvement for shoulder strength bilateral with internal/external rotation but continues to have decreased grip strength in Lt hand. Remainder of session focused on manual therapy to improve cervical mobility and reduce pain and postural exercises. HEP updated today with seated chin tucks as patient demonstrated good form with this exercise. He will benefit from addition of putty exercises to improve functional grip  strength in Lt hand next session. He will continue to benefit from skilled PT interventions to address impairments and progress further towards goals.    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 Session focus on cervical and shoulder ROM, postural strengthening and add putty grip strengthening exercises to treatment session and HEP.    PT Home Exercise Plan  At eval: scap elevation, Scap restraciton, shoulder ABDCT; 11/08/17 - chin tucks    Consulted and Agree with Plan of Care  Patient       Patient will benefit from skilled therapeutic intervention in order to improve the following deficits and impairments:  Hypomobility, Decreased knowledge of precautions, Pain, Decreased activity tolerance, Decreased strength, Impaired UE functional use, Increased muscle spasms, Decreased range of motion, Improper body mechanics, Postural dysfunction  Visit Diagnosis: Cervicalgia  Muscle weakness (generalized)     Problem List Patient Active Problem List   Diagnosis Date Noted  . Spondylolisthesis of lumbar region 04/12/2014  . RESTLESS LEGS SYNDROME 08/02/2009  . HYPERLIPIDEMIA 07/23/2009  . OBSTRUCTIVE SLEEP APNEA 07/23/2009    Kipp Brood, PT, DPT Physical Therapist with [facility]  11/08/2017 4:16 PM    [place], [place], [address] Phone: [phone]   Fax:  [phone]  Name: [name] MRN: [id number] Date of Birth: 1956/01/26

## 2017-11-08 NOTE — Patient Instructions (Signed)
   RETRACTION / CHIN TUCK: 1-2 sets of 15-20 repetitions with 3 second holds  Slowly draw your head back so that your ears line up with your shoulders.

## 2017-11-10 ENCOUNTER — Ambulatory Visit (HOSPITAL_COMMUNITY): Payer: BLUE CROSS/BLUE SHIELD

## 2017-11-10 ENCOUNTER — Encounter (HOSPITAL_COMMUNITY): Payer: Self-pay

## 2017-11-10 DIAGNOSIS — M542 Cervicalgia: Secondary | ICD-10-CM

## 2017-11-10 DIAGNOSIS — M6281 Muscle weakness (generalized): Secondary | ICD-10-CM

## 2017-11-10 NOTE — Patient Instructions (Signed)
Home Exercises Program Theraputty Exercises  Do the following exercises 2 times a day using your affected hand.  1. Roll putty into a ball.  2. Make into a pancake.  3. Roll putty into a roll.  4. Pinch along log with first finger and thumb.   5. Make into a ball.  6. Roll it back into a log.   7. Pinch using thumb and side of first finger.  8. Roll into a ball, then flatten into a pancake.  9. Using your fingers, make putty into a mountain.   

## 2017-11-10 NOTE — Therapy (Signed)
[place], [place], [address] Phone: [phone]   Fax:  [phone]  Physical Therapy Treatment  Patient Details  Name: [name] MRN: [id number] Date of Birth: 12/03/1955 Referring Provider: Mariann Laster   Encounter Date: 11/10/2017  PT End of Session - 11/10/17 1601    Visit Number  7    Number of Visits  12    Date for PT Re-Evaluation  11/25/17 Reassessment complete 11/08/17    Authorization Type  BCBS: (Re-Eval payable every 21 visit)    Authorization Time Period  10/14/17-11/25/17 (reassessment 11/08/17)    Authorization - Visit Number  7    Authorization - Number of Visits  60    PT Start Time  6962    PT Stop Time  1600    PT Time Calculation (min)  42 min    Activity Tolerance  Patient tolerated treatment well;No increased pain    Behavior During Therapy  WFL for tasks assessed/performed       Past Medical History:  Diagnosis Date  . Anxiety    some panic attacks- off medications  . Complication of anesthesia    woke up during surgery- Elbow surgery, 2009 and again with foot surgeryry   . Hypercholesteremia     Past Surgical History:  Procedure Laterality Date  . FOOT SURGERY    . left elbow       There were no vitals filed for this visit.  Subjective Assessment - 11/10/17 1515    Subjective  Pt stated he is feeling pretty good today, no reports of pain just tired following work today.     Patient Stated Goals  decrease pain, improve ability to work withouit limitations from neck     Currently in Pain?  No/denies                       [facility] Adult PT Treatment/Exercise - 11/10/17 0001      Neck Exercises: Machines for Strengthening   UBE (Upper Arm Bike)  4 mins retrograde on Level 1      Neck Exercises: Seated   Other Seated Exercise  Yellow putty grip for Lt hand including rolling into a ball, made into a pancake, roll into a log, pinch log      Shoulder Exercises:  Standing   External Rotation  Both;20 reps;Theraband    Theraband Level (Shoulder External Rotation)  Level 4 (Blue)    External Rotation Limitations  towel between elbow and side for mechanics and positioning    Extension  20 reps;Theraband    Theraband Level (Shoulder Extension)  Level 4 (Blue)    Row  20 reps;Theraband    Theraband Level (Shoulder Row)  Level 4 (Blue)    Retraction  15 reps;Theraband    Theraband Level (Shoulder Retraction)  Level 4 (Blue)      Manual Therapy   Manual Therapy  Soft tissue mobilization;Myofascial release;Other (comment)    Manual therapy comments  manual complete separate than rest of tx    Myofascial Release  Rt anteriro deltoid and pec minor supine with LE elevated; levator scapula in prone    Other Manual Therapy  position release technique to assist with sidebend       Neck Exercises: Stretches   Upper Trapezius Stretch  2 reps;30 seconds seated                PT Short Term Goals - 11/08/17  [place] #1   Title  In three weeks pt will deomnstrate improve cervical rotation to 65 degrees bilat.     Status  On-going      PT SHORT TERM GOAL #2   Title  In three weeks pt will demonstrate 5/5 strength in bilat shoulder IR/ER.     Status  Achieved        PT Long Term Goals - 11/08/17 1528      PT LONG TERM GOAL #1   Title  In 6 weeks patient will demonstrate 70 degrees cervical rotation bilat.     Status  On-going      PT LONG TERM GOAL #2   Title  In 6 weeks patient will demonstrate <10% differece in Left to right hand grip gross grasp strength.     Status  On-going      PT LONG TERM GOAL #3   Title  In 6 weeks patient will reports Less than 3/10 pain in neck/shoulders for >3d/week.     Baseline  11/08/17 - every morning about 6/10    Status  On-going            Plan - 11/10/17 1603    Clinical Impression Statement  Session focus with postural strengthening, cervical mobility and grip strengthening.   Added stretches and manual position release techniques to improve mobility and soft tissue mobilization to address restrictions.  Pt reports increased tension levator scapula region, soft tissue mobilizaiton technqiues complete to pec minor, MFR to anterior deltoid and levator to improve mobility and reduce restrictions, no reports of pain at EOS.      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  Continue with MFR to anterior deltoid and C/T-spine mobilization if with PT. Session focus on cervical and shoulder ROM, and postural strengthening.    PT Home Exercise Plan  At eval: scap elevation, Scap restraciton, shoulder ABDCT; 11/08/17 - chin tucks; 4/25: yellow grip putty       Patient will benefit from skilled therapeutic intervention in order to improve the following deficits and impairments:  Hypomobility, Decreased knowledge of precautions, Pain, Decreased activity tolerance, Decreased strength, Impaired UE functional use, Increased muscle spasms, Decreased range of motion, Improper body mechanics, Postural dysfunction  Visit Diagnosis: Cervicalgia  Muscle weakness (generalized)     Problem List Patient Active Problem List   Diagnosis Date Noted  . Spondylolisthesis of lumbar region 04/12/2014  . RESTLESS LEGS SYNDROME 08/02/2009  . HYPERLIPIDEMIA 07/23/2009  . OBSTRUCTIVE SLEEP APNEA 07/23/2009   Ihor Austin, LPTA; [place]  Aldona Lento 11/10/2017, 5:06 PM  [place], [place], [address] Phone: [phone]   Fax:  [phone]  Name: [name] MRN: [id number] Date of Birth: 02-Dec-1955

## 2017-11-14 ENCOUNTER — Ambulatory Visit (HOSPITAL_COMMUNITY): Payer: BLUE CROSS/BLUE SHIELD

## 2017-11-15 ENCOUNTER — Encounter (INDEPENDENT_AMBULATORY_CARE_PROVIDER_SITE_OTHER): Payer: Self-pay | Admitting: Orthopaedic Surgery

## 2017-11-15 ENCOUNTER — Ambulatory Visit (INDEPENDENT_AMBULATORY_CARE_PROVIDER_SITE_OTHER): Payer: BLUE CROSS/BLUE SHIELD | Admitting: Orthopaedic Surgery

## 2017-11-15 VITALS — BP 135/79 | HR 80 | Ht 70.0 in | Wt 203.0 lb

## 2017-11-15 DIAGNOSIS — M4722 Other spondylosis with radiculopathy, cervical region: Secondary | ICD-10-CM

## 2017-11-15 DIAGNOSIS — Z981 Arthrodesis status: Secondary | ICD-10-CM | POA: Diagnosis not present

## 2017-11-15 MED ORDER — PREDNISONE 10 MG (21) PO TBPK: ORAL_TABLET | ORAL | 0 refills | Status: DC

## 2017-11-15 NOTE — Progress Notes (Signed)
Office Visit Note   Patient: [name]           Date of Birth: [birth year]           MRN: [id number] Visit Date: 11/15/2017              Requested by: Asencion Noble, MD [address] [place], [place] [address] PCP: Asencion Noble, MD   Assessment & Plan: Visit Diagnoses:  1. Other spondylosis with radiculopathy, cervical region     Plan: Place him on a Medrol Dosepak and recheck him in a few weeks.  Likely he will require C3-4 anterior cervical discectomy and fusion with allograft plate, overnight stay in the hospital.  She did have to wear soft collar for 6 weeks until his fusion is solid.  He be on some pain medication for approximately 1 week.  Surgery was discussed including dysphasia dysphonia slight risk of pseudoarthrosis with possible need for posterior fusion if he did not heal anteriorly.  Recheck  2 weeks  Follow-Up Instructions: Return in about 2 weeks (around 11/29/2017).   Orders:  No orders of the defined types were placed in this encounter.  Meds ordered this encounter  Medications  . predniSONE (STERAPRED UNI-PAK 21 TAB) 10 MG (21) TBPK tablet    Sig: Take as instructed.( 6,5,4,3,2,1 )    Dispense:  21 tablet    Refill:  0      Procedures: No procedures performed   Clinical Data: No additional findings.   Subjective: Chief Complaint  Patient presents with  . Neck - Pain, Follow-up    HPI 62 year old male returns 3-week follow-up for persistent problems with cervical spondylosis.  He has C3-4 retrolisthesis with foraminal stenosis and cord flattening.  Mild narrowing at C5 and C6.  Is been through physical therapy and is noticed some improvement with therapy but still has persistent symptoms.  Neck pain radiates into his right shoulder.  Flexion-extension x-rays he has 2 to 3 mm of retrolisthesis at C3-4 with instability.  He has been treated with diclofenac, physical therapy and home exercise program.  Has not had prednisone pack.  Review of  Systems 14 point updated review of systems 10/05/2017 visit.  Of note is positive history of alcoholism anxiety.  Currently does not drink.  Depression, former smoker.  Previous left elbow surgery.  2005 L5-S1 fusion doing well.  Otherwise negative as it pertains HPI.   Objective: Vital Signs: BP 135/79   Pulse 80   Ht 5\' 10"  (1.778 m)   Wt 203 lb (92.1 kg)   BMI 29.13 kg/m   Physical Exam  Constitutional: He is oriented to person, place, and time. He appears well-developed and well-nourished.  HENT:  Head: Normocephalic and atraumatic.  Eyes: Pupils are equal, round, and reactive to light. EOM are normal.  Neck: No tracheal deviation present. No thyromegaly present.  Cardiovascular: Normal rate.  Pulmonary/Chest: Effort normal. He has no wheezes.  Abdominal: Soft. Bowel sounds are normal.  Neurological: He is alert and oriented to person, place, and time.  Skin: Skin is warm and dry. Capillary refill takes less than 2 seconds.  Psychiatric: He has a normal mood and affect. His behavior is normal. Judgment and thought content normal.    Ortho Exam is amatory well-healed lumbar incision.  Brachial plexus tenderness worse on the right and left.  Supraspinatus testing is normal.  Upper extremity reflexes are 2+ and symmetrical.  He has significant pain with extension of the cervical spine.  Increased pain with compression mild improvement with distraction.  Negative Lhermitte.  Specialty Comments:  No specialty comments available.  Imaging: No results found.   PMFS History: Patient Active Problem List   Diagnosis Date Noted  . Spondylolisthesis of lumbar region 04/12/2014  . RESTLESS LEGS SYNDROME 08/02/2009  . HYPERLIPIDEMIA 07/23/2009  . OBSTRUCTIVE SLEEP APNEA 07/23/2009   Past Medical History:  Diagnosis Date  . Anxiety    some panic attacks- off medications  . Complication of anesthesia    woke up during surgery- Elbow surgery, 2009 and again with foot surgeryry   .  Hypercholesteremia     No family history on file.  Past Surgical History:  Procedure Laterality Date  . FOOT SURGERY    . left elbow      Social History   Occupational History  . Not on file  Tobacco Use  . Smoking status: Former Smoker    Years: 12.00  . Smokeless tobacco: Never Used  Substance and Sexual Activity  . Alcohol use: No    Comment: recover alcolol 1985  . Drug use: No  . Sexual activity: Not on file    Comment: quit  1986

## 2017-11-17 ENCOUNTER — Telehealth (HOSPITAL_COMMUNITY): Payer: Self-pay

## 2017-11-17 ENCOUNTER — Ambulatory Visit (HOSPITAL_COMMUNITY): Payer: BLUE CROSS/BLUE SHIELD | Attending: Orthopaedic Surgery

## 2017-11-17 DIAGNOSIS — M6281 Muscle weakness (generalized): Secondary | ICD-10-CM | POA: Insufficient documentation

## 2017-11-17 DIAGNOSIS — M542 Cervicalgia: Secondary | ICD-10-CM | POA: Insufficient documentation

## 2017-11-17 NOTE — Telephone Encounter (Signed)
No show, called and left message concerning missed apt today.  Included next apt date and time with contact information given.    Aliyanna Wassmer, LPTA; CBIS [phone]  

## 2017-11-21 ENCOUNTER — Ambulatory Visit (HOSPITAL_COMMUNITY): Payer: BLUE CROSS/BLUE SHIELD

## 2017-11-21 ENCOUNTER — Encounter (HOSPITAL_COMMUNITY): Payer: Self-pay

## 2017-11-21 ENCOUNTER — Other Ambulatory Visit: Payer: Self-pay

## 2017-11-21 DIAGNOSIS — M542 Cervicalgia: Secondary | ICD-10-CM

## 2017-11-21 DIAGNOSIS — M6281 Muscle weakness (generalized): Secondary | ICD-10-CM

## 2017-11-21 NOTE — Therapy (Signed)
[place], [place], [address] Phone: [phone]   Fax:  [phone]  Physical Therapy Treatment  Patient Details  Name: [name] MRN: [id number] Date of Birth: Feb 26, 1956 Referring Provider: Mariann Laster   Encounter Date: 11/21/2017  PT End of Session - 11/21/17 1544    Visit Number  8    Number of Visits  12    Date for PT Re-Evaluation  11/25/17 Reassessment complete 11/08/17    Authorization Type  BCBS: (Re-Eval payable every 21 visit)    Authorization Time Period  10/14/17-11/25/17 (reassessment 11/08/17)    Authorization - Visit Number  8    Authorization - Number of Visits  60    PT Start Time  1522    PT Stop Time  1601    PT Time Calculation (min)  39 min    Activity Tolerance  Patient tolerated treatment well;No increased pain    Behavior During Therapy  WFL for tasks assessed/performed       Past Medical History:  Diagnosis Date  . Anxiety    some panic attacks- off medications  . Complication of anesthesia    woke up during surgery- Elbow surgery, 2009 and again with foot surgeryry   . Hypercholesteremia     Past Surgical History:  Procedure Laterality Date  . FOOT SURGERY    . left elbow       There were no vitals filed for this visit.  Subjective Assessment - 11/21/17 1524    Subjective  He reports he had to work Saturday but had a restful day off on Sunday. He denies any difficulties with his HEP and states his pain is around a 3 today.    Limitations  Reading    How long can you sit comfortably?  Not limited     How long can you stand comfortably?  Not limited     How long can you walk comfortably?  Not limited     Diagnostic tests  MRI     Patient Stated Goals  decrease pain, improve ability to work withouit limitations from neck     Currently in Pain?  Yes    Pain Score  3     Pain Location  Neck    Pain Orientation  Right;Left;Posterior    Pain Descriptors / Indicators   Aching;Dull tightness when reading    Pain Type  Chronic pain    Pain Onset  More than a month ago    Pain Frequency  Constant        [facility] Adult PT Treatment/Exercise - 11/21/17 0001      Neck Exercises: Machines for Strengthening   UBE (Upper Arm Bike)  4 mins retrograde on Level 2      Neck Exercises: Seated   Neck Retraction  20 reps;3 secs    Money  15 reps;3 secs;Limitations    Money Limitations  2 sets, green theraband      Shoulder Exercises: Standing   Extension  Theraband;15 reps    Theraband Level (Shoulder Extension)  Level 4 (Blue)    Extension Limitations  2 sets    Row  Theraband;15 reps    Theraband Level (Shoulder Row)  Level 4 (Blue)    Row Limitations  2 sets    Retraction  15 reps;Theraband;Limitations    Theraband Level (Shoulder Retraction)  Level 4 (Blue)    Retraction Limitations  2 sets      Manual Therapy  Manual Therapy  Joint mobilization    Manual therapy comments  manual complete separate than rest of tx    Joint Mobilization  PA's grade II, C2-T1, 3x 30-45 seconds to reduce pain and improve mobility. Elvy technique with Rt UE extended ~ 30 degrees and lateral glides at C4/5 to open joint space, 3x 30-45 secodns with oscillations grave III/IV        PT Education - 11/21/17 1605    Education provided  Yes    Education Details  Educated on exercises and purpose of interventions throughout session. updated HEP with theraband postural strengthening and educated on how to perform at home.    Person(s) Educated  Patient    Methods  Explanation;Handout;Verbal cues    Comprehension  Verbalized understanding;Returned demonstration       PT Short Term Goals - 11/08/17 1527      PT SHORT TERM GOAL #1   Title  In three weeks pt will deomnstrate improve cervical rotation to 65 degrees bilat.     Status  On-going      PT SHORT TERM GOAL #2   Title  In three weeks pt will demonstrate 5/5 strength in bilat shoulder IR/ER.     Status  Achieved         PT Long Term Goals - 11/08/17 1528      PT LONG TERM GOAL #1   Title  In 6 weeks patient will demonstrate 70 degrees cervical rotation bilat.     Status  On-going      PT LONG TERM GOAL #2   Title  In 6 weeks patient will demonstrate <10% differece in Left to right hand grip gross grasp strength.     Status  On-going      PT LONG TERM GOAL #3   Title  In 6 weeks patient will reports Less than 3/10 pain in neck/shoulders for >3d/week.     Baseline  11/08/17 - every morning about 6/10    Status  On-going         Plan - 11/21/17 1526    Clinical Impression Statement  Patient is progressing well and demonstrated good carryover with form for postural theraband strengthening this session and it was added to HEP. New postural exercises were added and he continued with cervical chin tucks in sitting. He will likely be able to progress to prone chin tucks next session. He had good response to manual therapy with Elvy technique for Rt UE and PA's to c-spine reporting reduction from 3/10 to 2/10 pain in neck at EOS. He will continue to benefit from skilled Pt services to address impairments and progress towards goals.    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 Continue with MFR to anterior deltoid and C/T-spine mobilization if with PT. Session focus on cervical and shoulder ROM, and postural strengthening. Continue with Elvy technique and nerve glides for Rt UE.    PT Home Exercise Plan  At eval: scap elevation, Scap restraciton, shoulder ABDCT; 11/08/17  - chin tucks; 4/25: yellow grip putty    Consulted and  Agree with Plan of Care  Patient       Patient will benefit from skilled therapeutic intervention in order to improve the following deficits and impairments:  Hypomobility, Decreased knowledge of precautions, Pain, Decreased activity tolerance, Decreased strength, Impaired UE functional use, Increased muscle spasms, Decreased range of motion, Improper body mechanics, Postural dysfunction  Visit Diagnosis: Cervicalgia  Muscle weakness (generalized)     Problem List Patient Active Problem List   Diagnosis Date Noted  . Status post lumbar spinal fusion 11/15/2017  . RESTLESS LEGS SYNDROME 08/02/2009  . HYPERLIPIDEMIA 07/23/2009  . OBSTRUCTIVE SLEEP APNEA 07/23/2009    Kipp Brood, PT, DPT Physical Therapist with [facility]  11/21/2017 4:16 PM    [place] [address] [place], [place], [address] Phone: [phone]   Fax:  [phone]  Name: [name] MRN: [id number] Date of Birth: [birth year]

## 2017-11-21 NOTE — Patient Instructions (Signed)
Standing Shoulder Row with Anchored Resistance REPS: 10-20  SETS: 1-3  HOLD: 3 seconds  WEEKLY: 7x  DAILY: 1x Setup Begin standing upright, holding both ends of a resistance band that is anchored in front of you at chest height, with your palms facing inward. Movement Pull your arms back with your elbows tucked at your sides, then return to the starting position and repeat. Tip Make sure to keep your core engaged and focus on squeezing your shoulder blades together as you pull on the band.   Single Arm Shoulder Extension with Anchored Resistance REPS: 10-20  SETS: 1-3  HOLD: 3 seconds  WEEKLY: 7x  DAILY: 1x Setup Begin in a standing position holding one end of a resistance band with your arm straight in front of your body. You should be facing the anchor point. Movement Pull your arm down to your side against the resistance band, then return to start and repeat. Tip Make sure to keep your elbow straight and maintain good posture during the exercise.

## 2017-11-25 ENCOUNTER — Ambulatory Visit (HOSPITAL_COMMUNITY): Payer: BLUE CROSS/BLUE SHIELD

## 2017-11-25 ENCOUNTER — Telehealth (HOSPITAL_COMMUNITY): Payer: Self-pay

## 2017-11-25 NOTE — Telephone Encounter (Signed)
No show #2; pt forgot about his appointment today. PT reminded pt of next scheduled visit on 11/28/17 at 3:!5 and he stated that he would be here.   Geraldine Solar PT, DPT

## 2017-11-28 ENCOUNTER — Encounter (HOSPITAL_COMMUNITY): Payer: Self-pay

## 2017-11-28 ENCOUNTER — Ambulatory Visit (HOSPITAL_COMMUNITY): Payer: BLUE CROSS/BLUE SHIELD

## 2017-11-28 DIAGNOSIS — M6281 Muscle weakness (generalized): Secondary | ICD-10-CM

## 2017-11-28 DIAGNOSIS — M542 Cervicalgia: Secondary | ICD-10-CM

## 2017-11-28 NOTE — Therapy (Signed)
PHYSICAL THERAPY DISCHARGE SUMMARY  Visits from Start of Care: 9  Current functional level related to goals / functional outcomes: Largely unchanged. See below for detail.    Remaining deficits: *See below for detail.     Education / Equipment: N/A  Plan: Patient agrees to discharge.  Patient goals were not met. Patient is being discharged due to lack of progress.  ?????        4:03 PM, 11/28/17 Etta Grandchild, PT, DPT Physical Therapist at [facility] [phone] (office)          [place] [address] [place], [place], [address] Phone: [phone]   Fax:  [phone]  Physical Therapy Treatment  Patient Details  Name: [name] MRN: [id number] Date of Birth: [birth year] Referring Provider: Rodell Perna   Encounter Date: 11/28/2017  PT End of Session - 11/28/17 1539    Visit Number  9    Number of Visits  12    Date for PT Re-Evaluation  11/25/17    Authorization Type  BCBS: (Re-Eval payable every 21 visit)    Authorization Time Period  10/14/17-11/25/17 (reassessment 11/08/17)    Authorization - Visit Number  9    Authorization - Number of Visits  60    PT Start Time  1521    PT Stop Time  1600    PT Time Calculation (min)  39 min    Activity Tolerance  Patient tolerated treatment well;No increased pain    Behavior During Therapy  WFL for tasks assessed/performed       Past Medical History:  Diagnosis Date  . Anxiety    some panic attacks- off medications  . Complication of anesthesia    woke up during surgery- Elbow surgery, 2009 and again with foot surgeryry   . Hypercholesteremia     Past Surgical History:  Procedure Laterality Date  . FOOT SURGERY    . left elbow       There were no vitals filed for this visit.  Subjective Assessment - 11/28/17 1524    Subjective  Pt reports HEP going well. Pt reports was on prednisone 2 WA without ay major improvementwith  symptoms. He reports to conitnue to have difficulty with aches adn pains at night, no so much numbeness.     Limitations  Reading    How long can you sit comfortably?  Not limited     How long can you stand comfortably?  Not limited     How long can you walk comfortably?  Not limited     Diagnostic tests  MRI     Patient Stated Goals  decrease pain, improve ability to work withouit limitations from neck     Currently in Pain?  Yes    Pain Score  4     Pain Location  Neck         [facility] PT Assessment - 11/28/17 0001      Assessment   Medical Diagnosis  Neck pain     Referring Provider  Rodell Perna    Onset Date/Surgical Date  06/08/17    Hand Dominance  Right    Prior Therapy  none      Balance Screen   Has the patient fallen in the past 6 months  No    Has the patient had a decrease in activity level because of a fear of falling?   No    Is the patient  reluctant to leave their home because of a fear of falling?   No      Observation/Other Assessments   Focus on Therapeutic Outcomes (FOTO)   51 (49% limited), was 45% limited at evaluation)       AROM   Cervical - Right Rotation  49 degrees    Cervical - Left Rotation  40 degrees      Strength   Right Hand Grip (lbs)  92lb    Left Hand Grip (lbs)  70lbs                   [facility] Adult PT Treatment/Exercise - 11/28/17 0001      Neck Exercises: Machines for Strengthening   UBE (Upper Arm Bike)  4 mins retrograde on Level 2 RUE dominance       Shoulder Exercises: Standing   ABduction  Both;Strengthening;Weights;12 reps    Shoulder ABduction Weight (lbs)  4lb dumbbells    Extension  Theraband;15 reps    Theraband Level (Shoulder Extension)  Level 4 (Blue)    Extension Limitations  2 sets    Row  Theraband;15 reps    Theraband Level (Shoulder Row)  Level 4 (Blue)    Row Limitations  2 sets    Retraction  15 reps;Theraband;Limitations    Theraband Level (Shoulder Retraction)  Level 4 (Blue)    Retraction  Limitations  2 sets    Shoulder Elevation  AROM;15 reps 2x10# bring to neutral then retract for 3"      Shoulder Exercises: Power Tower   External Rotation  15 reps 2x15 bilat, 1 plate      Manual Therapy   Manual Therapy  Soft tissue mobilization               PT Short Term Goals - 11/28/17 1548      PT SHORT TERM GOAL #1   Title  In three weeks pt will deomnstrate improve cervical rotation to 65 degrees bilat.     Baseline  At DC, 49 degrees left, 40 degrees Right     Status  Not Met      PT SHORT TERM GOAL #2   Title  In three weeks pt will demonstrate 5/5 strength in bilat shoulder IR/ER.     Status  Achieved        PT Long Term Goals - 11/28/17 1549      PT LONG TERM GOAL #1   Title  In 6 weeks patient will demonstrate 70 degrees cervical rotation bilat.     Status  Not Met      PT LONG TERM GOAL #2   Title  In 6 weeks patient will demonstrate <10% differece in Left to right hand grip gross grasp strength.     Baseline  At DC: 92lb right, 70lb left    Status  Partially Met      PT LONG TERM GOAL #3   Title  In 6 weeks patient will reports Less than 3/10 pain in neck/shoulders for >3d/week.     Baseline  remains faily severe at night getting up to 8/10 at night in bed.      Status  Not Met    Target Date  11/25/17            Plan - 11/28/17 1543    Clinical Impression Statement  Patient has completed his 6 weeks of PT with mild progress overall, but continues to have persistent pain, ROM limitations in cervical spine,  and some referred pain in BUE. Heavy emphasis on education with postural correction, particularly with lift tasks. FOTO demonstrates a decline in function. Pt will be DC back to th ecare of referring provder.     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     PT Home Exercise Plan  At eval: scap elevation, Scap restraciton, shoulder ABDCT; 11/08/17 - chin tucks; 4/25: yellow grip putty    Consulted and Agree with Plan of Care  Patient       Patient will benefit from skilled therapeutic intervention in order to improve the following deficits and impairments:  Hypomobility, Decreased knowledge of precautions, Pain, Decreased activity tolerance, Decreased strength, Impaired UE functional use, Increased muscle spasms, Decreased range of motion, Improper body mechanics, Postural dysfunction  Visit Diagnosis: Cervicalgia  Muscle weakness (generalized)     Problem List Patient Active Problem List   Diagnosis Date Noted  . Status post lumbar spinal fusion 11/15/2017  . RESTLESS LEGS SYNDROME 08/02/2009  . HYPERLIPIDEMIA 07/23/2009  . OBSTRUCTIVE SLEEP APNEA 07/23/2009   4:03 PM, 11/28/17 Etta Grandchild, PT, DPT Physical Therapist at [facility] [place] [phone] (office)      Etta Grandchild 11/28/2017, 4:02 PM  [place], [place], [address] Phone: [phone]   Fax:  [phone]  Name: [name] MRN: [id number] Date of Birth: 12/28/1955

## 2017-11-30 ENCOUNTER — Encounter (INDEPENDENT_AMBULATORY_CARE_PROVIDER_SITE_OTHER): Payer: Self-pay | Admitting: Orthopaedic Surgery

## 2017-11-30 ENCOUNTER — Ambulatory Visit (INDEPENDENT_AMBULATORY_CARE_PROVIDER_SITE_OTHER): Payer: BLUE CROSS/BLUE SHIELD | Admitting: Orthopaedic Surgery

## 2017-11-30 VITALS — BP 133/73 | HR 79

## 2017-11-30 DIAGNOSIS — M4722 Other spondylosis with radiculopathy, cervical region: Secondary | ICD-10-CM | POA: Diagnosis not present

## 2017-11-30 DIAGNOSIS — M25511 Pain in right shoulder: Secondary | ICD-10-CM | POA: Diagnosis not present

## 2017-11-30 DIAGNOSIS — G8929 Other chronic pain: Secondary | ICD-10-CM | POA: Diagnosis not present

## 2017-11-30 NOTE — H&P (Signed)
[name] is an 62 y.o. male.   Chief Complaint: Neck pain and right shoulder pain. 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  Progressively worsening pain.  Failed conservative treatment.  Patient also reports ongoing right shoulder pain secondary to impingement.  He had previous right shoulder x-ray January 2019 and diagnostic/therapeutic subacromial injection performed by Dr. Asencion Noble primary care physician.  States that injection helped for about 6 weeks but then pain returned.  has not had right shoulder MRI.  Patient has history of sleep apnea states that after weight loss symptoms greatly improved and no longer has to use CPAP.  Past Medical History:  Diagnosis Date  . Anxiety    some panic attacks- off medications  . Complication of anesthesia    woke up during surgery- Elbow surgery, 2009 and again with foot surgeryry   . Hypercholesteremia     Past Surgical History:  Procedure Laterality Date  . FOOT SURGERY    . left elbow       No family history on file. Social History:  reports that he has quit smoking. He quit after 12.00 years of use. He has never used smokeless tobacco. He reports that he does not drink alcohol or use drugs.  Allergies:  Allergies  Allergen Reactions  . Codeine Other (See Comments)    Out of right state of mind.   . Meloxicam Itching    No medications prior to admission.    No results found for this or any previous visit (from the past 48 hour(s)). No results found.  Review of Systems  Constitutional: Negative.   HENT: Negative.   Eyes: Negative.   Respiratory: Negative.   Gastrointestinal: Negative.   Genitourinary: Negative.   Musculoskeletal: Positive for joint pain and neck pain.  Skin: Negative.   Neurological: Positive for weakness.  Psychiatric/Behavioral: Negative.     There were no vitals taken for this visit. Physical Exam  Constitutional: He  is oriented to person, place, and time. He appears well-developed and well-nourished. No distress.  HENT:  Head: Normocephalic and atraumatic.  Eyes: Pupils are equal, round, and reactive to light. EOM are normal.  Neck:  Positive right brachial plexus tenderness.  Good cervical spine range of motion.  Cardiovascular: Normal rate and normal heart sounds.  Respiratory: Effort normal. No respiratory distress. He has no wheezes.  GI: Soft. He exhibits no distension. There is no tenderness.  Musculoskeletal:  Right shoulder good range of motion but with discomfort.  Moderate to markedly positive impingement test.  Tenderness over the [facility] joint.  Negative drop arm test.  Some pain and weakness with supraspinatus resistance.  He is markedly tender over the proximal biceps tendon.  Neurovascular intact.  Neurological: He is alert and oriented to person, place, and time.  Skin: Skin is warm and dry.  Psychiatric: He has a normal mood and affect.     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 Right shoulder impingement  We will proceed with C3-4 ACDF as scheduled.  Surgical procedure along with possible rehab/recovery time discussed.  All questions answered.  With his ongoing right shoulder issues that has failed conservative treatment with physical therapy, rest, oral NSAIDs, subacromial injection I will schedule MRI to rule out rotator cuff tear and other shoulder pathology.  Advised patient to contact me after scan is complete to go over his results by phone.  Benjiman Core, PA-C 11/30/2017, 12:51 PM

## 2017-11-30 NOTE — Progress Notes (Signed)
Office Visit Note   Patient: [name]           Date of Birth: 04-Aug-1955           MRN: [id number] Visit Date: 11/30/2017              Requested by: Asencion Noble, MD [address] [place], [place] [address] PCP: Asencion Noble, MD   Assessment & Plan: Visit Diagnoses:  1. Other spondylosis with radiculopathy, cervical region   2. Chronic right shoulder pain     Plan: Discussed operative options.  He like to proceed with C3-4 anterior cervical discectomy and fusion for his instability anterolisthesis which is dynamic on plain radiographs.  Flattening of the cord at C3-4 on MRI scan.  Discussed overnight stay use of soft cervical collar.  Fascia pseudoarthrosis possibility of posterior cervical fusion being needed if he did not heal at the C3-4 level which is less likely with only a single level fusion.  Risk of anesthesia discussed.  Questions were elicited and answered.  He understands and requests we proceed.  Follow-Up Instructions: No follow-ups on file.   Orders:  Orders Placed This Encounter  Procedures  . MR SHOULDER RIGHT WO CONTRAST   No orders of the defined types were placed in this encounter.     Procedures: No procedures performed   Clinical Data: No additional findings.   Subjective: Chief Complaint  Patient presents with  . Neck - Pain, Follow-up    HPI 62 year old male returns with ongoing pain with neck pain.  Patient took prednisone pack is not gotten better is been through physical therapy without improvement.  He is to have pain difficulty sleeping at night more pain that radiates in his right shoulder than left.  X-rays showed C3-4 retrolisthesis with foraminal stenosis.  MRI scan showed cord flattening at C3-4 where he has the retrolisthesis and dynamic instability.  There was mild changes at C 5-6 and C6-7.  Pain radiates from his neck into his shoulders.  Patient showed shifting on flexion extension x-rays 3 mm at the C3-4 level.  Review  of Systems positive for history of alcoholism, anxiety.  Patient does not currently drink.  History of depression former smoker.  His left elbow surgery doing well.  L5-S1 fusion 2005 doing well.  Positive for hyperlipidemia obstructive sleep apnea restless leg syndrome.   Objective: Vital Signs: BP 133/73   Pulse 79   Physical Exam  Constitutional: He is oriented to person, place, and time. He appears well-developed and well-nourished.  HENT:  Head: Normocephalic and atraumatic.  Eyes: Pupils are equal, round, and reactive to light. EOM are normal.  Neck: No tracheal deviation present. No thyromegaly present.  Cardiovascular: Normal rate.  Pulmonary/Chest: Effort normal. He has no wheezes.  Abdominal: Soft. Bowel sounds are normal.  Neurological: He is alert and oriented to person, place, and time.  Skin: Skin is warm and dry. Capillary refill takes less than 2 seconds.  Psychiatric: He has a normal mood and affect. His behavior is normal. Judgment and thought content normal.    Ortho Exam patient is amatory well-healed lumbar incision lower extremity no lower extremity weakness on exam.  Normal sensory.  He has brachial plexus tenderness worse on the right than left.  Negative drop arm test rotator cuff testing is normal.  Upper extremity reflexes are 2+ and symmetrical.  Has significant pain with  extension of the cervical spine.  Crease pain with cervical compression.  Mild improvement with  distraction.  Negative letter made.  Extremity hyperreflexia no clonus.  Specialty Comments:  No specialty comments available.  Imaging: CLINICAL DATA:  62 y/o M; neck pain extending bilaterally through the shoulders for several months. No known injury.  EXAM: MRI CERVICAL SPINE WITHOUT CONTRAST  TECHNIQUE: Multiplanar, multisequence MR imaging of the cervical spine was performed. No intravenous contrast was administered.  COMPARISON:  08/12/2017 cervical spine radiographs.  05/05/2007 cervical spine MRI.  FINDINGS: Alignment: Straightening of cervical lordosis. No listhesis. C3-4 retrolisthesis on prior cervical radiographs in the setting of relative extension is probably dynamic.  Vertebrae: No fracture, evidence of discitis, or bone lesion.  Cord: Normal signal and morphology.  Posterior Fossa, vertebral arteries, paraspinal tissues: Moderate maxillary sinus mucosal thickening.  Disc levels:  C2-3: Small central disc protrusion with ventral thecal sac effacement and minimal anterior cord flattening. No foraminal or canal stenosis.  C3-4: Disc osteophyte complex with anterior cord impingement and mild anterior cord flattening, mild canal stenosis, moderate bilateral foraminal stenosis.  C4-5: No significant disc displacement, foraminal stenosis, or canal stenosis.  C5-6: Disc osteophyte complex with prominent left-sided uncovertebral hypertrophy. Right anterior cord contact with mild flattening. Moderate left foraminal stenosis. No canal stenosis.  C6-7: Disc osteophyte complex and bilateral uncovertebral hypertrophy with mild bilateral foraminal stenosis. Anterior cord contact with mild flattening. No canal stenosis.  C7-T1: Left-greater-than-right uncovertebral hypertrophy and mild facet hypertrophy. Severe left and moderate right foraminal stenosis. No canal stenosis.  IMPRESSION: 1. No acute osseous abnormality or abnormal cord signal. 2. C3-4 retrolisthesis on prior cervical radiographs in the setting of relative extension is probably dynamic. 3. Cervical spondylosis with prominent multilevel discogenic degenerative changes progressed from 2008. 4. Multilevel disc contact on anterior cord with mild cord flattening at C3-4, C5-6, C6-7. 5. Mild C3-4 canal stenosis. 6. Multilevel mild and moderate foraminal stenosis. Severe left C7-T1 foraminal stenosis.   Electronically Signed   By: Kristine Garbe M.D.    On: 09/09/2017 22:35   PMFS History: Patient Active Problem List   Diagnosis Date Noted  . Status post lumbar spinal fusion 11/15/2017  . RESTLESS LEGS SYNDROME 08/02/2009  . HYPERLIPIDEMIA 07/23/2009  . OBSTRUCTIVE SLEEP APNEA 07/23/2009   Past Medical History:  Diagnosis Date  . Anxiety    some panic attacks- off medications  . Complication of anesthesia    woke up during surgery- Elbow surgery, 2009 and again with foot surgeryry   . Hypercholesteremia     History reviewed. No pertinent family history.  Past Surgical History:  Procedure Laterality Date  . FOOT SURGERY    . left elbow      Social History   Occupational History  . Not on file  Tobacco Use  . Smoking status: Former Smoker    Years: 12.00  . Smokeless tobacco: Never Used  Substance and Sexual Activity  . Alcohol use: No    Comment: recover alcolol 1985  . Drug use: No  . Sexual activity: Not on file    Comment: quit  1986

## 2017-12-08 ENCOUNTER — Encounter (INDEPENDENT_AMBULATORY_CARE_PROVIDER_SITE_OTHER): Payer: Self-pay | Admitting: Orthopaedic Surgery

## 2017-12-14 NOTE — Pre-Procedure Instructions (Signed)
Luanne Bras Marcon  12/14/2017      [id number] - [place], [place]  [address] Phone: [phone] Fax: [phone]  [id number] - [place], [place] AT [facility] [place] [address] Phone: [phone] Fax: [phone]    Your procedure is scheduled on June 5  Report to [facility] at 1030 A.M.  Call this number if you have problems the morning of surgery:  [phone]   Remember:  NOTHING TO EAT OR DRINK AFTER MIDNIGHT    Take these medicines the morning of surgery with A SIP OF WATER  escitalopram (LEXAPRO)   7 days prior to surgery STOP taking any Aspirin(unless otherwise instructed by your surgeon), Aleve, Naproxen, Ibuprofen, Motrin, Advil, Goody's, BC's, all herbal medications, fish oil, and all vitamins  STOP phentermine (ADIPEX-P) 2 weeks prior to surgery    Do not wear jewelry  Do not wear lotions, powders, or cologne, or deodorant.  Men may shave face and neck.  Do not bring valuables to the hospital.  [facility] is not responsible for any belongings or valuables.  Contacts, dentures or bridgework may not be worn into surgery.  Leave your suitcase in the car.  After surgery it may be brought to your room.  For patients admitted to the hospital, discharge time will be determined by your treatment team.  Patients discharged the day of surgery will not be allowed to drive home.    Special instructions:   - Preparing For Surgery  Before surgery, you can play an important role. Because skin is not sterile, your skin needs to be as free of germs as possible. You can reduce the number of germs on your skin by washing with CHG (chlorahexidine gluconate) Soap before surgery.  CHG is an antiseptic cleaner which kills germs and bonds with the skin to continue killing germs even after washing.    Oral Hygiene is also important to reduce your risk of infection.   Remember - BRUSH YOUR TEETH THE MORNING OF SURGERY WITH YOUR REGULAR TOOTHPASTE  Please do not use if you have an allergy to CHG or antibacterial soaps. If your skin becomes reddened/irritated stop using the CHG.  Do not shave (including legs and underarms) for at least 48 hours prior to first CHG shower. It is OK to shave your face.  Please follow these instructions carefully.   1. Shower the NIGHT BEFORE SURGERY and the MORNING OF SURGERY with CHG.   2. If you chose to wash your hair, wash your hair first as usual with your normal shampoo.  3. After you shampoo, rinse your hair and body thoroughly to remove the shampoo.  4. Use CHG as you would any other liquid soap. You can apply CHG directly to the skin and wash gently with a scrungie or a clean washcloth.   5. Apply the CHG Soap to your body ONLY FROM THE NECK DOWN.  Do not use on open wounds or open sores. Avoid contact with your eyes, ears, mouth and genitals (private parts). Wash Face and genitals (private parts)  with your normal soap.  6. Wash thoroughly, paying special attention to the area where your surgery will be performed.  7. Thoroughly rinse your body with warm water from the neck down.  8. DO NOT shower/wash with your normal soap after using and rinsing off the CHG Soap.  9. Pat yourself dry with a CLEAN TOWEL.  10. Wear CLEAN PAJAMAS to bed the night before surgery, wear comfortable clothes the morning of surgery  11. Place CLEAN SHEETS on your bed the night of your first shower and DO NOT SLEEP WITH PETS.    Day of Surgery:  Do not apply any deodorants/lotions.  Please wear clean clothes to the hospital/surgery center.   Remember to brush your teeth WITH YOUR REGULAR TOOTHPASTE.  r  Please read over the following fact sheets that you were given.

## 2017-12-15 ENCOUNTER — Encounter (HOSPITAL_COMMUNITY)
Admission: RE | Admit: 2017-12-15 | Discharge: 2017-12-15 | Disposition: A | Discharge status: Home / Self Care | Payer: BLUE CROSS/BLUE SHIELD | Source: Ambulatory Visit | Attending: Orthopaedic Surgery | Admitting: Orthopaedic Surgery

## 2017-12-15 ENCOUNTER — Encounter (HOSPITAL_COMMUNITY): Payer: Self-pay

## 2017-12-15 DIAGNOSIS — Z01812 Encounter for preprocedural laboratory examination: Secondary | ICD-10-CM | POA: Diagnosis present

## 2017-12-15 DIAGNOSIS — Z01810 Encounter for preprocedural cardiovascular examination: Secondary | ICD-10-CM | POA: Insufficient documentation

## 2017-12-15 HISTORY — DX: Gastro-esophageal reflux disease without esophagitis: K21.9

## 2017-12-15 HISTORY — DX: Unspecified osteoarthritis, unspecified site: M19.90

## 2017-12-15 HISTORY — DX: Sleep apnea, unspecified: G47.30

## 2017-12-15 LAB — COMPREHENSIVE METABOLIC PANEL
ALT: 23 U/L (ref 17–63)
AST: 22 U/L (ref 15–41)
Albumin: 4.5 g/dL (ref 3.5–5.0)
Alkaline Phosphatase: 59 U/L (ref 38–126)
Anion gap: 10 (ref 5–15)
BUN: 11 mg/dL (ref 6–20)
CO2: 25 mmol/L (ref 22–32)
Calcium: 9.6 mg/dL (ref 8.9–10.3)
Chloride: 101 mmol/L (ref 101–111)
Creatinine, Ser: 1.08 mg/dL (ref 0.61–1.24)
GFR calc Af Amer: >60 mL/min (ref >60)
GFR calc non Af Amer: >60 mL/min (ref >60)
Glucose, Bld: 88 mg/dL (ref 65–99)
Potassium: 4.2 mmol/L (ref 3.5–5.1)
Sodium: 136 mmol/L (ref 135–145)
Total Bilirubin: 1 mg/dL (ref 0.3–1.2)
Total Protein: 7.4 g/dL (ref 6.5–8.1)

## 2017-12-15 LAB — CBC
HCT: 44 % (ref 39.0–52.0)
Hemoglobin: 14.9 g/dL (ref 13.0–17.0)
MCH: 27.8 pg (ref 26.0–34.0)
MCHC: 33.9 g/dL (ref 30.0–36.0)
MCV: 82.1 fL (ref 78.0–100.0)
Platelets: 277 10*3/uL (ref 150–400)
RBC: 5.36 MIL/uL (ref 4.22–5.81)
RDW: 13.2 % (ref 11.5–15.5)
WBC: 7.5 10*3/uL (ref 4.0–10.5)

## 2017-12-15 LAB — SURGICAL PCR SCREEN
MRSA, PCR: NEGATIVE
Staphylococcus aureus: POSITIVE — AB

## 2017-12-15 NOTE — Progress Notes (Addendum)
PCP is Dr. Salena Saner  LOV 10/2017 Patient states he had stress test more than 10 yrs ago.  Initially c/o cp, but later determined it may have been indigestion.  No further w/u or f/u, and no c/o. No murmur, sob, cp currently. Has stopped taking the phentermine on the 29th.

## 2017-12-16 ENCOUNTER — Ambulatory Visit
Admission: RE | Admit: 2017-12-16 | Discharge: 2017-12-16 | Disposition: A | Discharge status: Home / Self Care | Payer: BLUE CROSS/BLUE SHIELD | Source: Ambulatory Visit | Attending: Surgery | Admitting: Surgery

## 2017-12-16 DIAGNOSIS — G8929 Other chronic pain: Secondary | ICD-10-CM

## 2017-12-16 DIAGNOSIS — M25511 Pain in right shoulder: Principal | ICD-10-CM

## 2017-12-20 ENCOUNTER — Telehealth (INDEPENDENT_AMBULATORY_CARE_PROVIDER_SITE_OTHER): Payer: Self-pay | Admitting: Orthopaedic Surgery

## 2017-12-20 NOTE — Telephone Encounter (Signed)
Patient called to let Dr Lorin Mercy know that he had his MRI last Friday. The number to contact patient is [phone]

## 2017-12-20 NOTE — Telephone Encounter (Signed)
I called discussed. SLAP 2 tear

## 2017-12-20 NOTE — Telephone Encounter (Signed)
Please advise 

## 2017-12-21 ENCOUNTER — Ambulatory Visit (HOSPITAL_COMMUNITY): Payer: BLUE CROSS/BLUE SHIELD

## 2017-12-21 ENCOUNTER — Ambulatory Visit (HOSPITAL_COMMUNITY): Payer: BLUE CROSS/BLUE SHIELD | Admitting: Emergency Medicine

## 2017-12-21 ENCOUNTER — Encounter (HOSPITAL_COMMUNITY): Payer: Self-pay | Admitting: *Deleted

## 2017-12-21 ENCOUNTER — Ambulatory Visit (HOSPITAL_COMMUNITY): Payer: BLUE CROSS/BLUE SHIELD | Admitting: Anesthesiology

## 2017-12-21 ENCOUNTER — Encounter (HOSPITAL_COMMUNITY)
Admission: RE | Disposition: A | Discharge status: Home / Self Care | Payer: Self-pay | Source: Ambulatory Visit | Attending: Orthopaedic Surgery

## 2017-12-21 ENCOUNTER — Observation Stay (HOSPITAL_COMMUNITY)
Admission: RE | Admit: 2017-12-21 | Discharge: 2017-12-22 | Disposition: A | Discharge status: Home / Self Care | Payer: BLUE CROSS/BLUE SHIELD | Source: Ambulatory Visit | Attending: Orthopaedic Surgery | Admitting: Orthopaedic Surgery

## 2017-12-21 DIAGNOSIS — Z888 Allergy status to other drugs, medicaments and biological substances status: Secondary | ICD-10-CM | POA: Diagnosis not present

## 2017-12-21 DIAGNOSIS — Z419 Encounter for procedure for purposes other than remedying health state, unspecified: Secondary | ICD-10-CM

## 2017-12-21 DIAGNOSIS — M47892 Other spondylosis, cervical region: Secondary | ICD-10-CM

## 2017-12-21 DIAGNOSIS — E7800 Pure hypercholesterolemia, unspecified: Secondary | ICD-10-CM | POA: Diagnosis not present

## 2017-12-21 DIAGNOSIS — M25811 Other specified joint disorders, right shoulder: Secondary | ICD-10-CM | POA: Insufficient documentation

## 2017-12-21 DIAGNOSIS — M4802 Spinal stenosis, cervical region: Secondary | ICD-10-CM | POA: Diagnosis present

## 2017-12-21 DIAGNOSIS — F419 Anxiety disorder, unspecified: Secondary | ICD-10-CM | POA: Insufficient documentation

## 2017-12-21 DIAGNOSIS — Z79899 Other long term (current) drug therapy: Secondary | ICD-10-CM | POA: Diagnosis not present

## 2017-12-21 DIAGNOSIS — Z885 Allergy status to narcotic agent status: Secondary | ICD-10-CM | POA: Insufficient documentation

## 2017-12-21 DIAGNOSIS — M4712 Other spondylosis with myelopathy, cervical region: Principal | ICD-10-CM | POA: Insufficient documentation

## 2017-12-21 HISTORY — PX: ANTERIOR CERVICAL DECOMP/DISCECTOMY FUSION: SHX1161

## 2017-12-21 SURGERY — ANTERIOR CERVICAL DECOMPRESSION/DISCECTOMY FUSION 1 LEVEL
Anesthesia: General | Site: Neck

## 2017-12-21 MED ORDER — ESCITALOPRAM OXALATE 20 MG PO TABS
20.0000 mg | ORAL_TABLET | Freq: Every day | ORAL | Status: DC
  Filled 2017-12-21: qty 1

## 2017-12-21 MED ORDER — SUGAMMADEX SODIUM 200 MG/2ML IV SOLN
INTRAVENOUS | Status: DC | PRN
  Administered 2017-12-21: 200 mg via INTRAVENOUS

## 2017-12-21 MED ORDER — SODIUM CHLORIDE 0.9% FLUSH: 3.0000 mL | INTRAVENOUS | Status: DC | PRN

## 2017-12-21 MED ORDER — PHENYLEPHRINE HCL 10 MG/ML IJ SOLN
INTRAMUSCULAR | Status: DC | PRN
  Administered 2017-12-21: 40 ug via INTRAVENOUS

## 2017-12-21 MED ORDER — OXYCODONE HCL 5 MG PO TABS
5.0000 mg | ORAL_TABLET | Freq: Four times a day (QID) | ORAL | Status: DC | PRN
  Administered 2017-12-21 – 2017-12-22 (×3): 5 mg via ORAL
  Filled 2017-12-21 (×3): qty 1

## 2017-12-21 MED ORDER — FENTANYL CITRATE (PF) 250 MCG/5ML IJ SOLN
INTRAMUSCULAR | Status: AC
  Filled 2017-12-21: qty 5

## 2017-12-21 MED ORDER — ATORVASTATIN CALCIUM 20 MG PO TABS
40.0000 mg | ORAL_TABLET | Freq: Every day | ORAL | Status: DC
  Administered 2017-12-21: 40 mg via ORAL
  Filled 2017-12-21: qty 2

## 2017-12-21 MED ORDER — FENTANYL CITRATE (PF) 250 MCG/5ML IJ SOLN
INTRAMUSCULAR | Status: DC | PRN
  Administered 2017-12-21: 25 ug via INTRAVENOUS
  Administered 2017-12-21 (×3): 50 ug via INTRAVENOUS
  Administered 2017-12-21: 75 ug via INTRAVENOUS

## 2017-12-21 MED ORDER — DEXTROSE 5 % IV SOLN
INTRAVENOUS | Status: DC | PRN
  Administered 2017-12-21: 25 ug/min via INTRAVENOUS

## 2017-12-21 MED ORDER — LIDOCAINE 2% (20 MG/ML) 5 ML SYRINGE
INTRAMUSCULAR | Status: DC | PRN
  Administered 2017-12-21: 100 mg via INTRAVENOUS

## 2017-12-21 MED ORDER — PROMETHAZINE HCL 25 MG/ML IJ SOLN
6.2500 mg | INTRAMUSCULAR | Status: DC | PRN
  Administered 2017-12-21: 6.25 mg via INTRAVENOUS

## 2017-12-21 MED ORDER — MIDAZOLAM HCL 2 MG/2ML IJ SOLN
INTRAMUSCULAR | Status: DC | PRN
  Administered 2017-12-21: 2 mg via INTRAVENOUS

## 2017-12-21 MED ORDER — ONDANSETRON HCL 4 MG/2ML IJ SOLN: 4.0000 mg | Freq: Four times a day (QID) | INTRAMUSCULAR | Status: DC | PRN

## 2017-12-21 MED ORDER — METHOCARBAMOL 500 MG PO TABS: 500.0000 mg | ORAL_TABLET | Freq: Four times a day (QID) | ORAL | 0 refills | Status: DC | PRN

## 2017-12-21 MED ORDER — METHOCARBAMOL 500 MG PO TABS
500.0000 mg | ORAL_TABLET | Freq: Four times a day (QID) | ORAL | Status: DC | PRN
  Administered 2017-12-21 – 2017-12-22 (×3): 500 mg via ORAL
  Filled 2017-12-21 (×3): qty 1

## 2017-12-21 MED ORDER — ONDANSETRON HCL 4 MG/2ML IJ SOLN
INTRAMUSCULAR | Status: DC | PRN
  Administered 2017-12-21: 4 mg via INTRAVENOUS

## 2017-12-21 MED ORDER — ACETAMINOPHEN 10 MG/ML IV SOLN
INTRAVENOUS | Status: AC
  Filled 2017-12-21: qty 100

## 2017-12-21 MED ORDER — PROMETHAZINE HCL 25 MG/ML IJ SOLN
INTRAMUSCULAR | Status: AC
  Filled 2017-12-21: qty 1

## 2017-12-21 MED ORDER — ACETAMINOPHEN 10 MG/ML IV SOLN
INTRAVENOUS | Status: DC | PRN
  Administered 2017-12-21: 1000 mg via INTRAVENOUS

## 2017-12-21 MED ORDER — MEPERIDINE HCL 50 MG/ML IJ SOLN: 6.2500 mg | INTRAMUSCULAR | Status: DC | PRN

## 2017-12-21 MED ORDER — CHLORHEXIDINE GLUCONATE 4 % EX LIQD: 60.0000 mL | Freq: Once | CUTANEOUS | Status: DC

## 2017-12-21 MED ORDER — EPHEDRINE SULFATE 50 MG/ML IJ SOLN
INTRAMUSCULAR | Status: DC | PRN
  Administered 2017-12-21 (×2): 10 mg via INTRAVENOUS

## 2017-12-21 MED ORDER — 0.9 % SODIUM CHLORIDE (POUR BTL) OPTIME
TOPICAL | Status: DC | PRN
  Administered 2017-12-21: 1000 mL

## 2017-12-21 MED ORDER — LACTATED RINGERS IV SOLN
INTRAVENOUS | Status: DC
  Administered 2017-12-21 (×2): via INTRAVENOUS

## 2017-12-21 MED ORDER — ROCURONIUM BROMIDE 10 MG/ML (PF) SYRINGE
PREFILLED_SYRINGE | INTRAVENOUS | Status: DC | PRN
  Administered 2017-12-21: 30 mg via INTRAVENOUS
  Administered 2017-12-21: 50 mg via INTRAVENOUS

## 2017-12-21 MED ORDER — PROPOFOL 10 MG/ML IV BOLUS
INTRAVENOUS | Status: DC | PRN
  Administered 2017-12-21: 200 mg via INTRAVENOUS

## 2017-12-21 MED ORDER — MIDAZOLAM HCL 2 MG/2ML IJ SOLN
INTRAMUSCULAR | Status: AC
  Filled 2017-12-21: qty 2

## 2017-12-21 MED ORDER — CEFAZOLIN SODIUM-DEXTROSE 2-4 GM/100ML-% IV SOLN
2.0000 g | INTRAVENOUS | Status: AC
  Administered 2017-12-21: 2 g via INTRAVENOUS
  Filled 2017-12-21: qty 100

## 2017-12-21 MED ORDER — PHENOL 1.4 % MT LIQD: 1.0000 | OROMUCOSAL | Status: DC | PRN

## 2017-12-21 MED ORDER — ALPRAZOLAM 0.25 MG PO TABS: 0.2500 mg | ORAL_TABLET | Freq: Every evening | ORAL | Status: DC | PRN

## 2017-12-21 MED ORDER — BUPIVACAINE-EPINEPHRINE (PF) 0.5% -1:200000 IJ SOLN
INTRAMUSCULAR | Status: AC
  Filled 2017-12-21: qty 30

## 2017-12-21 MED ORDER — HYDROMORPHONE HCL 2 MG/ML IJ SOLN
INTRAMUSCULAR | Status: AC
  Administered 2017-12-21: 0.5 mg via INTRAVENOUS
  Filled 2017-12-21: qty 1

## 2017-12-21 MED ORDER — CEFAZOLIN SODIUM-DEXTROSE 1-4 GM/50ML-% IV SOLN
1.0000 g | Freq: Three times a day (TID) | INTRAVENOUS | Status: AC
  Administered 2017-12-21 – 2017-12-22 (×2): 1 g via INTRAVENOUS
  Filled 2017-12-21 (×2): qty 50

## 2017-12-21 MED ORDER — HEMOSTATIC AGENTS (NO CHARGE) OPTIME
TOPICAL | Status: DC | PRN
  Administered 2017-12-21: 1 via TOPICAL

## 2017-12-21 MED ORDER — METHOCARBAMOL 1000 MG/10ML IJ SOLN
500.0000 mg | Freq: Four times a day (QID) | INTRAVENOUS | Status: DC | PRN
  Filled 2017-12-21: qty 5

## 2017-12-21 MED ORDER — POLYETHYLENE GLYCOL 3350 17 G PO PACK: 17.0000 g | PACK | Freq: Every day | ORAL | Status: DC

## 2017-12-21 MED ORDER — MENTHOL 3 MG MT LOZG: 1.0000 | LOZENGE | OROMUCOSAL | Status: DC | PRN

## 2017-12-21 MED ORDER — OXYCODONE-ACETAMINOPHEN 5-325 MG PO TABS: 1.0000 | ORAL_TABLET | Freq: Four times a day (QID) | ORAL | 0 refills | Status: DC | PRN

## 2017-12-21 MED ORDER — BUPIVACAINE-EPINEPHRINE 0.5% -1:200000 IJ SOLN
INTRAMUSCULAR | Status: DC | PRN
  Administered 2017-12-21: 6 mL

## 2017-12-21 MED ORDER — HYDROMORPHONE HCL 2 MG/ML IJ SOLN
0.2500 mg | INTRAMUSCULAR | Status: DC | PRN
  Administered 2017-12-21 (×2): 0.5 mg via INTRAVENOUS

## 2017-12-21 MED ORDER — ACETAMINOPHEN 650 MG RE SUPP: 650.0000 mg | RECTAL | Status: DC | PRN

## 2017-12-21 MED ORDER — SODIUM CHLORIDE 0.9 % IV SOLN: INTRAVENOUS | Status: DC

## 2017-12-21 MED ORDER — ACETAMINOPHEN 325 MG PO TABS: 650.0000 mg | ORAL_TABLET | ORAL | Status: DC | PRN

## 2017-12-21 MED ORDER — ONDANSETRON HCL 4 MG PO TABS: 4.0000 mg | ORAL_TABLET | Freq: Four times a day (QID) | ORAL | Status: DC | PRN

## 2017-12-21 MED ORDER — SODIUM CHLORIDE 0.9% FLUSH
3.0000 mL | Freq: Two times a day (BID) | INTRAVENOUS | Status: DC
  Administered 2017-12-21: 3 mL via INTRAVENOUS

## 2017-12-21 MED ORDER — DOCUSATE SODIUM 100 MG PO CAPS
100.0000 mg | ORAL_CAPSULE | Freq: Two times a day (BID) | ORAL | Status: DC
  Administered 2017-12-21: 100 mg via ORAL
  Filled 2017-12-21: qty 1

## 2017-12-21 MED ORDER — DEXAMETHASONE SODIUM PHOSPHATE 10 MG/ML IJ SOLN
INTRAMUSCULAR | Status: DC | PRN
  Administered 2017-12-21: 10 mg via INTRAVENOUS

## 2017-12-21 MED ORDER — SODIUM CHLORIDE 0.9 % IV SOLN: 250.0000 mL | INTRAVENOUS | Status: DC

## 2017-12-21 SURGICAL SUPPLY — 55 items
APL SKNCLS STERI-STRIP NONHPOA (GAUZE/BANDAGES/DRESSINGS) ×1
BENZOIN TINCTURE PRP APPL 2/3 (GAUZE/BANDAGES/DRESSINGS) ×2 IMPLANT
BIT DRILL SRG 14X2.2XFLT CHK (BIT) IMPLANT
BIT DRL SRG 14X2.2XFLT CHK (BIT) ×1
BLADE CLIPPER SURG (BLADE) IMPLANT
BONE CERV LORDOTIC 14.5X12X8 (Bone Implant) ×2 IMPLANT
BUR ROUND FLUTED 4 SOFT TCH (BURR) IMPLANT
COLLAR CERV LO CONTOUR FIRM DE (SOFTGOODS) ×2 IMPLANT
COVER MAYO STAND STRL (DRAPES) ×2 IMPLANT
COVER SURGICAL LIGHT HANDLE (MISCELLANEOUS) ×2 IMPLANT
CRADLE DONUT ADULT HEAD (MISCELLANEOUS) ×2 IMPLANT
DRAPE C-ARM 42X72 X-RAY (DRAPES) ×2 IMPLANT
DRAPE HALF SHEET 40X57 (DRAPES) ×2 IMPLANT
DRAPE MICROSCOPE LEICA (MISCELLANEOUS) ×2 IMPLANT
DRAPE POUCH INSTRU U-SHP 10X18 (DRAPES) ×1 IMPLANT
DRILL BIT SKYLINE 14MM (BIT) ×2
DURAPREP 6ML APPLICATOR 50/CS (WOUND CARE) ×2 IMPLANT
ELECT COATED BLADE 2.86 ST (ELECTRODE) ×2 IMPLANT
ELECT REM PT RETURN 9FT ADLT (ELECTROSURGICAL) ×2
ELECTRODE REM PT RTRN 9FT ADLT (ELECTROSURGICAL) ×1 IMPLANT
EVACUATOR 1/8 PVC DRAIN (DRAIN) ×3 IMPLANT
GAUZE SPONGE 4X4 12PLY STRL (GAUZE/BANDAGES/DRESSINGS) ×2 IMPLANT
GAUZE SPONGE 4X4 12PLY STRL LF (GAUZE/BANDAGES/DRESSINGS) ×1 IMPLANT
GAUZE SPONGE 4X4 16PLY XRAY LF (GAUZE/BANDAGES/DRESSINGS) ×1 IMPLANT
GLOVE BIOGEL PI IND STRL 8 (GLOVE) ×2 IMPLANT
GLOVE BIOGEL PI INDICATOR 8 (GLOVE) ×2
GLOVE ORTHO TXT STRL SZ7.5 (GLOVE) ×4 IMPLANT
GOWN STRL REUS W/ TWL LRG LVL3 (GOWN DISPOSABLE) ×1 IMPLANT
GOWN STRL REUS W/ TWL XL LVL3 (GOWN DISPOSABLE) ×1 IMPLANT
GOWN STRL REUS W/TWL 2XL LVL3 (GOWN DISPOSABLE) ×2 IMPLANT
GOWN STRL REUS W/TWL LRG LVL3 (GOWN DISPOSABLE) ×2
GOWN STRL REUS W/TWL XL LVL3 (GOWN DISPOSABLE) ×2
HEAD HALTER (SOFTGOODS) ×2 IMPLANT
KIT BASIN OR (CUSTOM PROCEDURE TRAY) ×2 IMPLANT
KIT TURNOVER KIT B (KITS) ×2 IMPLANT
MANIFOLD NEPTUNE II (INSTRUMENTS) ×2 IMPLANT
NEEDLE 25GX 5/8IN NON SAFETY (NEEDLE) ×2 IMPLANT
NS IRRIG 1000ML POUR BTL (IV SOLUTION) ×2 IMPLANT
PACK ORTHO CERVICAL (CUSTOM PROCEDURE TRAY) ×2 IMPLANT
PAD ARMBOARD 7.5X6 YLW CONV (MISCELLANEOUS) ×4 IMPLANT
PATTIES SURGICAL .5 X.5 (GAUZE/BANDAGES/DRESSINGS) IMPLANT
PLATE ONE LEVEL SKYLINE 14MM (Plate) ×1 IMPLANT
SCREW SKYLINE VAR OS 14MM (Screw) ×2 IMPLANT
SCREW VAR SELF TAP SKYLINE 14M (Screw) ×6 IMPLANT
STRIP CLOSURE SKIN 1/2X4 (GAUZE/BANDAGES/DRESSINGS) ×2 IMPLANT
SURGIFLO W/THROMBIN 8M KIT (HEMOSTASIS) ×1 IMPLANT
SUT BONE WAX W31G (SUTURE) ×2 IMPLANT
SUT VIC AB 3-0 PS2 18 (SUTURE) ×2
SUT VIC AB 3-0 PS2 18XBRD (SUTURE) ×1 IMPLANT
SUT VIC AB 4-0 PS2 27 (SUTURE) ×2 IMPLANT
SYR BULB 3OZ (MISCELLANEOUS) ×2 IMPLANT
TAPE CLOTH SURG 4X10 WHT LF (GAUZE/BANDAGES/DRESSINGS) ×1 IMPLANT
TOWEL OR 17X24 6PK STRL BLUE (TOWEL DISPOSABLE) ×2 IMPLANT
TOWEL OR 17X26 10 PK STRL BLUE (TOWEL DISPOSABLE) ×2 IMPLANT
WATER STERILE IRR 1000ML POUR (IV SOLUTION) ×2 IMPLANT

## 2017-12-21 NOTE — Progress Notes (Signed)
Orthopedic Tech Progress Note Patient Details:  [name] 1956/02/12 [id number]  Ortho Devices Type of Ortho Device: Soft collar Ortho Device/Splint Location: bedside Ortho Device/Splint Interventions: Criss Alvine 12/21/2017, 6:37 PM

## 2017-12-21 NOTE — Anesthesia Postprocedure Evaluation (Signed)
Anesthesia Post Note  Patient: [name]  Procedure(s) Performed: C3-4 ANTERIOR CERVICAL DECOMPRESSION/DISCECTOMY FUSION, ALLOGRAFT, PLATE (N/A Neck)     Patient location during evaluation: PACU Anesthesia Type: General Level of consciousness: awake Pain management: pain level controlled Vital Signs Assessment: post-procedure vital signs reviewed and stable Respiratory status: spontaneous breathing Cardiovascular status: stable Postop Assessment: no apparent nausea or vomiting Anesthetic complications: no    Last Vitals:  Vitals:   12/21/17 1552 12/21/17 1605  BP: 120/76 129/77  Pulse: (!) 111 97  Resp: 13 16  Temp:  36.9 C  SpO2: 100% 95%    Last Pain:  Vitals:   12/21/17 1610  TempSrc:   PainSc: 0-No pain   Pain Goal: Patients Stated Pain Goal: 5 (12/21/17 0959)               [name],[name]

## 2017-12-21 NOTE — Plan of Care (Signed)

## 2017-12-21 NOTE — Interval H&P Note (Signed)
History and Physical Interval Note:  12/21/2017 12:27 PM  [name]  has presented today for surgery, with the diagnosis of C3-4 SPONDYLOSIS  The various methods of treatment have been discussed with the patient and family. After consideration of risks, benefits and other options for treatment, the patient has consented to  Procedure(s): C3-4 ANTERIOR CERVICAL DECOMPRESSION/DISCECTOMY FUSION, ALLOGRAFT, PLATE (N/A) as a surgical intervention .  The patient's history has been reviewed, patient examined, no change in status, stable for surgery.  I have reviewed the patient's chart and labs.  Questions were answered to the patient's satisfaction.     Marybelle Killings

## 2017-12-21 NOTE — Anesthesia Procedure Notes (Signed)
Procedure Name: Intubation Date/Time: 12/21/2017 12:42 PM Performed by: Clearnce Sorrel, CRNA Pre-anesthesia Checklist: Patient identified, Emergency Drugs available, Suction available and Patient being monitored Patient Re-evaluated:Patient Re-evaluated prior to induction Oxygen Delivery Method: Circle System Utilized Preoxygenation: Pre-oxygenation with 100% oxygen Induction Type: IV induction Ventilation: Mask ventilation without difficulty and Oral airway inserted - appropriate to patient size Laryngoscope Size: Mac and 3 Grade View: Grade I Tube type: Oral Tube size: 7.5 mm Number of attempts: 1 Airway Equipment and Method: Stylet Placement Confirmation: ETT inserted through vocal cords under direct vision,  positive ETCO2 and breath sounds checked- equal and bilateral Secured at: 22 cm Tube secured with: Tape Dental Injury: Teeth and Oropharynx as per pre-operative assessment

## 2017-12-21 NOTE — Transfer of Care (Signed)
Immediate Anesthesia Transfer of Care Note  Patient: [name]  Procedure(s) Performed: C3-4 ANTERIOR CERVICAL DECOMPRESSION/DISCECTOMY FUSION, ALLOGRAFT, PLATE (N/A Neck)  Patient Location: PACU  Anesthesia Type:General  Level of Consciousness: awake, alert  and oriented  Airway & Oxygen Therapy: Patient Spontanous Breathing and Patient connected to nasal cannula oxygen  Post-op Assessment: Report given to RN and Post -op Vital signs reviewed and stable  Post vital signs: Reviewed and stable  Last Vitals:  Vitals Value Taken Time  BP 110/67 12/21/2017  3:22 PM  Temp    Pulse 86 12/21/2017  3:23 PM  Resp 8 12/21/2017  3:23 PM  SpO2 95 % 12/21/2017  3:23 PM  Vitals shown include unvalidated device data.  Last Pain:  Vitals:   12/21/17 1519  TempSrc:   PainSc: 7       Patients Stated Pain Goal: 5 (18/28/83 3744)  Complications: No apparent anesthesia complications

## 2017-12-21 NOTE — Op Note (Signed)
Preop diagnosis: C3-4 cervical spondylosis with by foraminal stenosis  Postop diagnosis: Same  Procedure: C3-4 anterior cervical discectomy and fusion, allograft and plate  Surgeon: Rodell Perna, MD  Assistant: Benjiman Core, PA-C medically necessary and present for the entire procedure  Anesthesia: General +6 cc Marcaine local  Drains one Hemovac neck  Implants: Skyline Depuy 14 mm plate, 14 mm screws x4.  8 mmVG2 lordotic allograft from [facility] tissue back  Procedure after induction general anesthesia orotracheal intubation standard prepping and draping with arms tucked at the side yellow pads underneath the ulnar nerve head halter traction was applied without weight.  DuraPrep Ancef prophylaxis timeout procedure completed.  Area squared with towel sterile skin marker starting at the midline extending to the left based on palpable landmarks.  Betadine Steri-Drape sterile male standard the head and thyroid sheet and drapes were applied.  Timeout procedure completed incision started midline extending to the left platysma was divided in line with the skin incision.  Blunt dissection above the omohyoid was performed directly down to C3- 4 interspace with prominent spur and short 25 needle with straight clamp was placed confirmed with the sterilely draped C arm that were at the appropriate level.  Disc was marked taking a whole disc out taking the spurs off anteriorly and self-retaining Cloward retractor was placed teeth plates right and left mid plate cephalad caudad.  Operative microscope was draped brought in discectomy was performed.  There was shingling of the posterior endplates with C3 over C4 and some additional bone had to be removed at the top of C4 to get past the spurs and remove disc material that was bulging and removal of bone spurs.  Uncovertebral joints were stripped with a Cloward curettes.  Trial sizers progressed up to an 8 mm which gave good fit.  Posterior longitudinal  ligament was taken down with good visualization of the dura.  No extruded fragments.  Epidural bleeding was encountered and some Surgi-Flo was used with patties.  Mild epidural bleeding after placement 8 mm graft.  Hemovac was placed within and out technique after 14 mm plate was placed checked under C arm screws were placed final spot pictures taken AP and lateral and then tiny locking screw was used to lock down all 4 screws.  Operative field was checked and anterior paraspinal space was dry.  Platysma reapproximated with 3-0 Vicryl 4-0 Vicryl subcuticular closure.  Tincture benzoin Steri-Strips Marcaine infiltration in the skin 4 x 4's tape and soft collar was applied.  Patient tolerated the procedure well was transferred to the recovery room.

## 2017-12-21 NOTE — Anesthesia Preprocedure Evaluation (Addendum)
Anesthesia Evaluation  Patient identified by MRN, date of birth, ID band Patient awake    Reviewed: Allergy & Precautions, H&P , NPO status , Patient's Chart, lab work & pertinent test results  History of Anesthesia Complications (+) history of anesthetic complications  Airway Mallampati: II  TM Distance: >3 FB Neck ROM: full    Dental  (+) Edentulous Upper, Edentulous Lower   Pulmonary sleep apnea , former smoker,    Pulmonary exam normal breath sounds clear to auscultation       Cardiovascular Normal cardiovascular exam Rhythm:Regular Rate:Normal     Neuro/Psych Anxiety    GI/Hepatic Neg liver ROS, GERD  ,  Endo/Other  negative endocrine ROS  Renal/GU negative Renal ROS     Musculoskeletal  (+) Arthritis ,   Abdominal Normal abdominal exam  (+)   Peds  Hematology   Anesthesia Other Findings   Reproductive/Obstetrics                          Anesthesia Physical  Anesthesia Plan  ASA: II  Anesthesia Plan: General   Post-op Pain Management:    Induction: Intravenous  PONV Risk Score and Plan: 2 and Ondansetron, Dexamethasone, Treatment may vary due to age or medical condition and Midazolam  Airway Management Planned: Oral ETT  Additional Equipment:   Intra-op Plan:   Post-operative Plan: Extubation in OR  Informed Consent: I have reviewed the patients History and Physical, chart, labs and discussed the procedure including the risks, benefits and alternatives for the proposed anesthesia with the patient or authorized representative who has indicated his/her understanding and acceptance.   Dental advisory given  Plan Discussed with: CRNA and Surgeon  Anesthesia Plan Comments:       Anesthesia Quick Evaluation

## 2017-12-21 NOTE — Addendum Note (Signed)
Addendum  created 12/21/17 1729 by Clearnce Sorrel, CRNA   Intraprocedure Event edited, Intraprocedure Staff edited

## 2017-12-22 ENCOUNTER — Encounter (HOSPITAL_COMMUNITY): Payer: Self-pay | Admitting: Orthopaedic Surgery

## 2017-12-22 ENCOUNTER — Other Ambulatory Visit: Payer: Self-pay

## 2017-12-22 DIAGNOSIS — M4712 Other spondylosis with myelopathy, cervical region: Secondary | ICD-10-CM | POA: Diagnosis not present

## 2017-12-22 MED ORDER — OXYCODONE-ACETAMINOPHEN 5-325 MG PO TABS: 1.0000 | ORAL_TABLET | Freq: Four times a day (QID) | ORAL | 0 refills | Status: DC | PRN

## 2017-12-22 NOTE — Progress Notes (Signed)
Patient is discharged from room 3C07 at this time. Alert and in stable condition. IV site d/c'd and instructions read to patient and spouse with understanding verbalized. Left unit via wheelchair with all belongings at side. 

## 2017-12-28 ENCOUNTER — Encounter (INDEPENDENT_AMBULATORY_CARE_PROVIDER_SITE_OTHER): Payer: Self-pay | Admitting: Orthopaedic Surgery

## 2017-12-28 ENCOUNTER — Ambulatory Visit (INDEPENDENT_AMBULATORY_CARE_PROVIDER_SITE_OTHER): Payer: BLUE CROSS/BLUE SHIELD

## 2017-12-28 ENCOUNTER — Ambulatory Visit (INDEPENDENT_AMBULATORY_CARE_PROVIDER_SITE_OTHER): Payer: BLUE CROSS/BLUE SHIELD | Admitting: Orthopaedic Surgery

## 2017-12-28 VITALS — BP 135/76 | HR 82 | Ht 70.0 in | Wt 205.0 lb

## 2017-12-28 DIAGNOSIS — Z981 Arthrodesis status: Secondary | ICD-10-CM | POA: Diagnosis not present

## 2017-12-28 NOTE — Progress Notes (Signed)
   Post-Op Visit Note   Patient: [name]           Date of Birth: 1956/07/03           MRN: [id number] Visit Date: 12/28/2017 PCP: Asencion Noble, MD   Assessment & Plan: Post C3-4 cervical fusion he still has some discomfort more on the right than left in his anterior shoulder and thinks it might be slightly better.  Incision looks good Steri-Strips changed.  2 view x-rays look good.  Return visit 5 weeks for lateral flexion-extension C-spine x-ray.  Chief Complaint:  Chief Complaint  Patient presents with  . Neck - Routine Post Op   Visit Diagnoses:  1. History of fusion of cervical spine     Plan: Return 5 weeks for lateral flexion-extension C-spine x-ray.  Follow-Up Instructions: No follow-ups on file.   Orders:  Orders Placed This Encounter  Procedures  . XR Cervical Spine 2 or 3 views   No orders of the defined types were placed in this encounter.   Imaging: Xr Cervical Spine 2 Or 3 Views  Result Date: 12/28/2017 AP and lateral C-spine x-ray demonstrates C3-4 anterior cervical discectomy and fusion allograft and plate in satisfactory position. Impression: Satisfactory postop cervical fusion C3-4.   PMFS History: Patient Active Problem List   Diagnosis Date Noted  . Cervical spinal stenosis 12/21/2017  . Status post lumbar spinal fusion 11/15/2017  . RESTLESS LEGS SYNDROME 08/02/2009  . HYPERLIPIDEMIA 07/23/2009  . OBSTRUCTIVE SLEEP APNEA 07/23/2009   Past Medical History:  Diagnosis Date  . Anxiety    some panic attacks- off medications  . Arthritis   . Complication of anesthesia    woke up during surgery- Elbow surgery, 2009 and again with foot surgeryry   . GERD (gastroesophageal reflux disease)    once in awhile  . Hypercholesteremia   . Sleep apnea    has lost 'abunch of weight' its been yrs since i used it    No family history on file.  Past Surgical History:  Procedure Laterality Date  . ANTERIOR CERVICAL DECOMP/DISCECTOMY FUSION N/A  12/21/2017   Procedure: C3-4 ANTERIOR CERVICAL DECOMPRESSION/DISCECTOMY FUSION, ALLOGRAFT, PLATE;  Surgeon: Marybelle Killings, MD;  Location: [facility];  Service: Orthopedics;  Laterality: N/A;  . FOOT SURGERY    . left elbow     . LUMBAR LAMINECTOMY     back in 2015   Social History   Occupational History  . Not on file  Tobacco Use  . Smoking status: Former Smoker    Years: 12.00  . Smokeless tobacco: Never Used  Substance and Sexual Activity  . Alcohol use: No    Comment: recover alcolol 1985  . Drug use: No  . Sexual activity: Not on file    Comment: quit  1986

## 2018-01-04 NOTE — Discharge Summary (Signed)
Patient ID: [name] MRN: [id number] DOB/AGE: 1955/12/28 62 y.o.  Admit date: 12/21/2017 Discharge date: 01/04/2018  Admission Diagnoses:  Active Problems:   Cervical spinal stenosis   Discharge Diagnoses:  Active Problems:   Cervical spinal stenosis  status post Procedure(s): C3-4 ANTERIOR CERVICAL DECOMPRESSION/DISCECTOMY FUSION, ALLOGRAFT, PLATE  Past Medical History:  Diagnosis Date  . Anxiety    some panic attacks- off medications  . Arthritis   . Complication of anesthesia    woke up during surgery- Elbow surgery, 2009 and again with foot surgeryry   . GERD (gastroesophageal reflux disease)    once in awhile  . Hypercholesteremia   . Sleep apnea    has lost 'abunch of weight' its been yrs since i used it    Surgeries: Procedure(s): C3-4 ANTERIOR CERVICAL DECOMPRESSION/DISCECTOMY FUSION, ALLOGRAFT, PLATE on 07/28/5091   Consultants:   Discharged Condition: Improved  Hospital Course: [name] is an 62 y.o. male who was admitted 12/21/2017 for operative treatment of cervical stenosis. Patient failed conservative treatments (please see the history and physical for the specifics) and had severe unremitting pain that affects sleep, daily activities and work/hobbies. After pre-op clearance, the patient was taken to the operating room on 12/21/2017 and underwent  Procedure(s): C3-4 ANTERIOR CERVICAL DECOMPRESSION/DISCECTOMY FUSION, ALLOGRAFT, PLATE.    Patient was given perioperative antibiotics:  Anti-infectives (From admission, onward)   Start     Dose/Rate Route Frequency Ordered Stop   12/21/17 2030  ceFAZolin (ANCEF) IVPB 1 g/50 mL premix     1 g 100 mL/hr over 30 Minutes Intravenous Every 8 hours 12/21/17 1609 12/22/17 0425   12/21/17 1215  ceFAZolin (ANCEF) IVPB 2g/100 mL premix     2 g 200 mL/hr over 30 Minutes Intravenous On call to O.R. 12/21/17 0945 12/21/17 1251       Patient was given sequential compression devices and early ambulation to  prevent DVT.   Patient benefited maximally from hospital stay and there were no complications. At the time of discharge, the patient was urinating/moving their bowels without difficulty, tolerating a regular diet, pain is controlled with oral pain medications and they have been cleared by PT/OT.   Recent vital signs: No data found.   Recent laboratory studies: No results for input(s): WBC, HGB, HCT, PLT, NA, K, CL, CO2, BUN, CREATININE, GLUCOSE, INR, CALCIUM in the last 72 hours.  Invalid input(s): PT, 2   Discharge Medications:   Allergies as of 12/22/2017      Reactions   Codeine Other (See Comments)   Out of right state of mind.    Meloxicam Itching      Medication List    STOP taking these medications   BC FAST PAIN RELIEF ARTHRITIS 1000-65 MG Pack Generic drug:  Aspirin-Caffeine   ibuprofen 200 MG tablet Commonly known as:  ADVIL,MOTRIN     TAKE these medications   ALPRAZolam 0.25 MG tablet Commonly known as:  XANAX Take 0.25 mg by mouth at bedtime. Takes one at bedtime nightly   atorvastatin 40 MG tablet Commonly known as:  LIPITOR Take 40 mg by mouth daily.   escitalopram 20 MG tablet Commonly known as:  LEXAPRO Take 20 mg by mouth daily.   methocarbamol 500 MG tablet Commonly known as:  ROBAXIN Take 1 tablet (500 mg total) by mouth every 6 (six) hours as needed for muscle spasms.   oxyCODONE-acetaminophen 5-325 MG tablet Commonly known as:  PERCOCET/ROXICET Take 1 tablet by mouth every 6 (six) hours as  needed for severe pain.   phentermine 37.5 MG tablet Commonly known as:  ADIPEX-P Take 37.5 mg by mouth daily before breakfast.       Diagnostic Studies: Dg Cervical Spine 2-3 Views  Result Date: 12/21/2017 CLINICAL DATA:  ACDF C3-4. EXAM: CERVICAL SPINE - 2-3 VIEW COMPARISON:  10/05/2017 and prior radiographs FINDINGS: Intraoperative views of the cervical spine are submitted postoperatively for interpretation. Film 1 demonstrates a metallic probe  directed at the C3-4 interspace Film 2 and 3 demonstrates anterior plate/screw and interbody fusion at C3-4. No gross complicating features noted. IMPRESSION: C3-4 fusion. Electronically Signed   By: Margarette Canada M.D.   On: 12/21/2017 16:11   Mr Shoulder Right Wo Contrast  Result Date: 12/17/2017 CLINICAL DATA:  Right shoulder pain, weakness and limited range of motion for 6 months. No known injury. EXAM: MRI OF THE RIGHT SHOULDER WITHOUT CONTRAST TECHNIQUE: Multiplanar, multisequence MR imaging of the shoulder was performed. No intravenous contrast was administered. COMPARISON:  None. FINDINGS: Rotator cuff: There is thickening and increased T2 signal in the supraspinatus, infraspinatus and subscapularis tendons consistent with tendinopathy. No tear is identified. Muscles:  Normal without atrophy or focal lesion. Biceps long head: Intact. Mild intrasubstance increased T2 signal within the intra-articular segment of the tendon consistent with tendinopathy is identified. Acromioclavicular Joint: Moderately severe degenerative change is present. Type 1 acromion. Fluid is seen in the subacromial/subdeltoid bursa consistent with bursitis. Glenohumeral Joint: Preserved. Labrum: Abnormal intrasubstance increased T2 signal in the superior labrum extending from anterior to the biceps tendon attachment posteriorly to the 11 o'clock position is consistent with a SLAP tear. Bones:  No fracture or worrisome lesion. Other: None. IMPRESSION: Rotator cuff tendinopathy without tear. More mild appearing tendinopathy of the intra-articular long head of biceps without tear is also identified. SLAP tear appears to be a type 2 lesion. Moderately severe acromioclavicular osteoarthritis. Subacromial/subdeltoid bursitis. Electronically Signed   By: Inge Rise M.D.   On: 12/17/2017 08:27   Dg C-arm 1-60 Min  Result Date: 12/21/2017 CLINICAL DATA:  C3-C4 ACDF EXAM: DG C-ARM 61-120 MIN COMPARISON:  10/05/2017 FLUOROSCOPY TIME:  0  minutes 12 seconds Images submitted: 3 FINDINGS: Initial image at 1306 hours reveals an anterior thin metallic density projecting over the C3-C4 disc space. Endplate spur formation noted at C4-C5. Two additional images obtained at 1418 hours and 1419 hours reveal placement of an anterior plate and screws at P9-J0. No subluxation. IMPRESSION: Anterior fusion and plating of C3-C4. Electronically Signed   By: Lavonia Dana M.D.   On: 12/21/2017 16:12   Xr Cervical Spine 2 Or 3 Views  Result Date: 12/28/2017 AP and lateral C-spine x-ray demonstrates C3-4 anterior cervical discectomy and fusion allograft and plate in satisfactory position. Impression: Satisfactory postop cervical fusion C3-4.     Follow-up Information    Schedule an appointment as soon as possible for a visit with Marybelle Killings, MD.   Specialty:  Orthopedic Surgery Why:  need return office with dr Lorin Mercy one week postop Contact information: [place] [address] [phone]           Discharge Plan:  discharge to home  Disposition:     Signed: Benjiman Core  01/04/2018, 4:08 PM

## 2018-02-01 ENCOUNTER — Ambulatory Visit (INDEPENDENT_AMBULATORY_CARE_PROVIDER_SITE_OTHER): Payer: BLUE CROSS/BLUE SHIELD

## 2018-02-01 ENCOUNTER — Encounter (INDEPENDENT_AMBULATORY_CARE_PROVIDER_SITE_OTHER): Payer: Self-pay | Admitting: Orthopaedic Surgery

## 2018-02-01 ENCOUNTER — Ambulatory Visit (INDEPENDENT_AMBULATORY_CARE_PROVIDER_SITE_OTHER): Payer: BLUE CROSS/BLUE SHIELD | Admitting: Orthopaedic Surgery

## 2018-02-01 VITALS — BP 126/80 | HR 80 | Ht 70.0 in | Wt 210.0 lb

## 2018-02-01 DIAGNOSIS — Z981 Arthrodesis status: Secondary | ICD-10-CM

## 2018-02-01 NOTE — Progress Notes (Signed)
   Post-Op Visit Note   Patient: [name]           Date of Birth: February 20, 1956           MRN: [id number] Visit Date: 02/01/2018 PCP: Asencion Noble, MD   Assessment & Plan: Recheck 6 to 8 weeks.  He has a type II SLAP tear in his right shoulder which is symptomatic.  Will allow a cervical fusion to consolidate a little bit more and I will recheck him in 8 weeks and we can discuss how his right shoulder is doing.  We discussed operative treatment for his type II SLAP tear if he is persistently symptomatic.  Chief Complaint: No chief complaint on file.  Visit Diagnoses:  1. History of fusion of cervical spine     Plan: Patient can discontinue the collar he can resume work on  Follow-Up Instructions: No follow-ups on file.   Orders:  Orders Placed This Encounter  Procedures  . XR Cervical Spine 2 or 3 views   No orders of the defined types were placed in this encounter.   Imaging: No results found.  PMFS History: Patient Active Problem List   Diagnosis Date Noted  . History of fusion of cervical spine 12/28/2017  . Cervical spinal stenosis 12/21/2017  . Status post lumbar spinal fusion 11/15/2017  . RESTLESS LEGS SYNDROME 08/02/2009  . HYPERLIPIDEMIA 07/23/2009  . OBSTRUCTIVE SLEEP APNEA 07/23/2009   Past Medical History:  Diagnosis Date  . Anxiety    some panic attacks- off medications  . Arthritis   . Complication of anesthesia    woke up during surgery- Elbow surgery, 2009 and again with foot surgeryry   . GERD (gastroesophageal reflux disease)    once in awhile  . Hypercholesteremia   . Sleep apnea    has lost 'abunch of weight' its been yrs since i used it    History reviewed. No pertinent family history.  Past Surgical History:  Procedure Laterality Date  . ANTERIOR CERVICAL DECOMP/DISCECTOMY FUSION N/A 12/21/2017   Procedure: C3-4 ANTERIOR CERVICAL DECOMPRESSION/DISCECTOMY FUSION, ALLOGRAFT, PLATE;  Surgeon: Marybelle Killings, MD;  Location: [facility];  Service:  Orthopedics;  Laterality: N/A;  . FOOT SURGERY    . left elbow     . LUMBAR LAMINECTOMY     back in 2015   Social History   Occupational History  . Not on file  Tobacco Use  . Smoking status: Former Smoker    Years: 12.00  . Smokeless tobacco: Never Used  Substance and Sexual Activity  . Alcohol use: No    Comment: recover alcolol 1985  . Drug use: No  . Sexual activity: Not on file    Comment: quit  1986

## 2018-02-03 ENCOUNTER — Telehealth (INDEPENDENT_AMBULATORY_CARE_PROVIDER_SITE_OTHER): Payer: Self-pay | Admitting: Orthopaedic Surgery

## 2018-02-03 NOTE — Telephone Encounter (Signed)
Message Sent In Error   °

## 2018-02-06 ENCOUNTER — Ambulatory Visit (INDEPENDENT_AMBULATORY_CARE_PROVIDER_SITE_OTHER): Payer: Self-pay

## 2018-02-06 DIAGNOSIS — Z1211 Encounter for screening for malignant neoplasm of colon: Secondary | ICD-10-CM

## 2018-02-06 MED ORDER — PEG 3350-KCL-NA BICARB-NACL 420 G PO SOLR: 4000.0000 mL | ORAL | 0 refills | Status: DC

## 2018-02-06 NOTE — Progress Notes (Signed)
Gastroenterology Pre-Procedure Review  Request Date:02/06/18 Requesting Physician: 10 year recall- last tcs 04/14/07 [facility]- hyperplastic polyp  PATIENT REVIEW QUESTIONS: The patient responded to the following health history questions as indicated:    1. Diabetes Melitis: no 2. Joint replacements in the past 12 months: no 3. Major health problems in the past 3 months: yes (june 5th neck surgery at [facility]) 4. Has an artificial valve or MVP: no 5. Has a defibrillator: no 6. Has been advised in past to take antibiotics in advance of a procedure like teeth cleaning: no 7. Family history of colon cancer: yes (cousin)  49. Alcohol Use: no 9. History of sleep apnea: no  10. History of coronary artery or other vascular stents placed within the last 12 months: no 11. History of any prior anesthesia complications: no    MEDICATIONS & ALLERGIES:    Patient reports the following regarding taking any blood thinners:   Plavix? no Aspirin? no Coumadin? no Brilinta? no Xarelto? no Eliquis? no Pradaxa? no Savaysa? no Effient? no  Patient confirms/reports the following medications:  Current Outpatient Medications  Medication Sig Dispense Refill  . ALPRAZolam (XANAX) 0.25 MG tablet Take 0.25 mg by mouth at bedtime. Takes one at bedtime nightly     . atorvastatin (LIPITOR) 40 MG tablet Take 40 mg by mouth daily.    Marland Kitchen escitalopram (LEXAPRO) 20 MG tablet Take 20 mg by mouth daily.    . phentermine (ADIPEX-P) 37.5 MG tablet Take 37.5 mg by mouth daily before breakfast.      No current facility-administered medications for this visit.     Patient confirms/reports the following allergies:  Allergies  Allergen Reactions  . Codeine Other (See Comments)    Out of right state of mind.   . Meloxicam Itching    No orders of the defined types were placed in this encounter.   AUTHORIZATION INFORMATION Primary Insurance: BCBS [place],  [place] #: [id number] Pre-Cert / Josem Kaufmann required:  Pre-Cert / Auth  #:   SCHEDULE INFORMATION: Procedure has been scheduled as follows:  Date: 04/14/18, Time:12:00 Location: [facility] Dr.Fields  This Gastroenterology Pre-Precedure Review Form is being routed to the following provider(s): Neil Crouch, PA

## 2018-02-06 NOTE — Patient Instructions (Signed)
[name]   08/04/1955 MRN: [id number]    Procedure Date: 04/14/18 Time to register: 11:00 Place to register: [facility] Stay Procedure Time: 12:00 Scheduled provider: Barney Drain, MD  PREPARATION FOR COLONOSCOPY WITH TRI-LYTE SPLIT PREP  Please notify us immediately if you are diabetic, take iron supplements, or if you are on Coumadin or any other blood thinners.     You will need to purchase 1 fleet enema and 1 box of Bisacodyl '5mg'$  tablets.   1 DAY BEFORE PROCEDURE:  DATE: 04/13/18   DAY: Thursday    clear liquids the entire day - NO SOLID FOOD.     At 2:00 pm:  Take 2 Bisacodyl tablets.   At 4:00pm:  Start drinking your solution. Make sure you mix well per instructions on the bottle. Try to drink 1 (one) 8 ounce glass every 10-15 minutes until you have consumed HALF the jug. You should complete by 6:00pm.You must keep the left over solution refrigerated until completed next day.  Continue clear liquids. You must drink plenty of clear liquids to prevent dehyration and kidney failure.     DAY OF PROCEDURE:   DATE: 04/14/18  DAY: Friday If you take medications for your heart, blood pressure or breathing, you may take these medications.    Five hours before your procedure time @ 7:00am:  Finish remaining amout of bowel prep, drinking 1 (one) 8 ounce glass every 10-15 minutes until complete. You have two hours to consume remaining prep.   Three hours before your procedure time '@9'$ :00am:  Nothing by mouth.   At least one hour before going to the hospital:  Give yourself one Fleet enema. You may take your morning medications with sip of water unless we have instructed otherwise.      Please see below for Dietary Information.  CLEAR LIQUIDS INCLUDE:  Water Jello (NOT red in color)   Ice Popsicles (NOT red in color)   Tea (sugar ok, no milk/cream) Powdered fruit flavored drinks  Coffee (sugar ok, no milk/cream) Gatorade/ Lemonade/ Kool-Aid  (NOT red in color)    Juice: apple, white grape, white cranberry Soft drinks  Clear bullion, consomme, broth (fat free beef/chicken/vegetable)  Carbonated beverages (any kind)  Strained chicken noodle soup Hard Candy   Remember: Clear liquids are liquids that will allow you to see your fingers on the other side of a clear glass. Be sure liquids are NOT red in color, and not cloudy, but CLEAR.  DO NOT EAT OR DRINK ANY OF THE FOLLOWING:  Dairy products of any kind   Cranberry juice Tomato juice / V8 juice   Grapefruit juice Orange juice     Red grape juice  Do not eat any solid foods, including such foods as: cereal, oatmeal, yogurt, fruits, vegetables, creamed soups, eggs, bread, crackers, pureed foods in a blender, etc.   HELPFUL HINTS FOR DRINKING PREP SOLUTION:   Make sure prep is extremely cold. Mix and refrigerate the the morning of the prep. You may also put in the freezer.   You may try mixing some Crystal Light or Country Time Lemonade if you prefer. Mix in small amounts; add more if necessary.  Try drinking through a straw  Rinse mouth with water or a mouthwash between glasses, to remove after-taste.  Try sipping on a cold beverage /ice/ popsicles between glasses of prep.  Place a piece of sugar-free hard candy in mouth between glasses.  If you become nauseated, try consuming smaller amounts, or stretch  out the time between glasses. Stop for 30-60 minutes, then slowly start back drinking.        OTHER INSTRUCTIONS  You will need a responsible adult at least 61 years of age to accompany you and drive you home. This person must remain in the waiting room during your procedure. The hospital will cancel your procedure if you do not have a responsible adult with you.   1. Wear loose fitting clothing that is easily removed. 2. Leave jewelry and other valuables at home.  3. Remove all body piercing jewelry and leave at home. 4. Total time from sign-in until discharge is approximately 2-3  hours. 5. You should go home directly after your procedure and rest. You can resume normal activities the day after your procedure. 6. The day of your procedure you should not:  Drive  Make legal decisions  Operate machinery  Drink alcohol  Return to work   You may call the office (Dept: [phone]) before 5:00pm, or page the doctor on call [phone]) after 5:00pm, for further instructions, if necessary.   Insurance Information YOU WILL NEED TO CHECK WITH YOUR INSURANCE COMPANY FOR THE BENEFITS OF COVERAGE YOU HAVE FOR THIS PROCEDURE.  UNFORTUNATELY, NOT ALL INSURANCE COMPANIES HAVE BENEFITS TO COVER ALL OR PART OF THESE TYPES OF PROCEDURES.  IT IS YOUR RESPONSIBILITY TO CHECK YOUR BENEFITS, HOWEVER, WE WILL BE GLAD TO ASSIST YOU WITH ANY CODES YOUR INSURANCE COMPANY MAY NEED.    PLEASE NOTE THAT MOST INSURANCE COMPANIES WILL NOT COVER A SCREENING COLONOSCOPY FOR PEOPLE UNDER THE AGE OF 50  IF YOU HAVE BCBS INSURANCE, YOU MAY HAVE BENEFITS FOR A SCREENING COLONOSCOPY BUT IF POLYPS ARE FOUND THE DIAGNOSIS WILL CHANGE AND THEN YOU MAY HAVE A DEDUCTIBLE THAT WILL NEED TO BE MET. SO PLEASE MAKE SURE YOU CHECK YOUR BENEFITS FOR A SCREENING COLONOSCOPY AS WELL AS A DIAGNOSTIC COLONOSCOPY.

## 2018-02-06 NOTE — Progress Notes (Signed)
Per [facility]: patient woke up during surgery, 2009 and received general anesthesia. Not sure of accuracy.   On Xanax, Lexapro.   Dr. Oneida Alar, do you want conscious sedation or deep sedation?

## 2018-02-07 NOTE — Progress Notes (Signed)
Message sent to [facility]

## 2018-02-09 ENCOUNTER — Telehealth: Payer: Self-pay

## 2018-02-09 NOTE — Telephone Encounter (Signed)
Letter mailed to the pt. He is scheduled for 05/09/18 at 11:00am with [facility]

## 2018-02-09 NOTE — Telephone Encounter (Signed)
-----   Message from Danie Binder, MD sent at 02/07/2018  1:05 PM EDT ----- IN 2008 HAD MOD SEDATION. SCHEDULE TCS W/ MAC.  ----- Message ----- From: Claudina Lick, LPN Sent: 3/81/8299  12:55 PM To: Danie Binder, MD  Dr.Fields, please also see this note from Magda Paganini about this triage:   Mahala Menghini, PA-C filed at 02/06/2018 9:42 PM   Status: Signed    Per [facility]: patient woke up during surgery, 2009 and received general anesthesia. Not sure of accuracy.   On Xanax, Lexapro.   Dr. Oneida Alar, do you want conscious sedation or deep sedation?

## 2018-02-09 NOTE — Progress Notes (Signed)
See phone note. Letter has been mailed to the pt. Called [facility] and cancelled procedure.

## 2018-03-29 ENCOUNTER — Encounter (INDEPENDENT_AMBULATORY_CARE_PROVIDER_SITE_OTHER): Payer: Self-pay | Admitting: Orthopaedic Surgery

## 2018-03-29 ENCOUNTER — Ambulatory Visit (INDEPENDENT_AMBULATORY_CARE_PROVIDER_SITE_OTHER): Payer: BLUE CROSS/BLUE SHIELD | Admitting: Orthopaedic Surgery

## 2018-03-29 VITALS — BP 127/79 | HR 71 | Ht 70.0 in | Wt 205.0 lb

## 2018-03-29 DIAGNOSIS — Z981 Arthrodesis status: Secondary | ICD-10-CM | POA: Diagnosis not present

## 2018-03-29 DIAGNOSIS — S43431A Superior glenoid labrum lesion of right shoulder, initial encounter: Secondary | ICD-10-CM

## 2018-03-29 NOTE — Progress Notes (Signed)
Office Visit Note   Patient: [name]           Date of Birth: Sep 11, 1955           MRN: [id number] Visit Date: 03/29/2018              Requested by: Asencion Noble, MD [address] [place], [place] [address] PCP: Asencion Noble, MD   Assessment & Plan: Visit Diagnoses:  1. Superior labrum anterior-to-posterior (SLAP) tear of right shoulder   2. History of fusion of cervical spine   3. Status post lumbar spinal fusion     Plan: Patient is symptomatic SLAP tear of the right shoulder which is bothering for greater than 5 months.  He continues to have pain and is had previous injections without relief.  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  Expected length out of work would be 4 to 6 weeks if modified work was available.  We discussed operative technique for biceps tenodesis.  We discussed debridement of the SLAP tear biceps release and tenodesis with mini open incision.  Questions were elicited and answered he understands and requests we proceed.  This would be as an outpatient procedure with preoperative interscalene block and general anesthesia.  We will make an appointment in 1 month he can call if he like to proceed.  Decision for shoulder surgery was made today and informed consent obtained.  Follow-Up Instructions: Return in about 1 month (around 04/28/2018).   Orders:  No orders of the defined types were placed in this encounter.  No orders of the defined types were placed in this encounter.     Procedures: No procedures performed   Clinical Data: No additional findings.   Subjective: Chief Complaint  Patient presents with  . Neck - Follow-up    12/21/17 C 3-4 ACDF, Allograft, Plate  . Right Shoulder - Follow-up    HPI 62 year old male returns 3 months post C3-4 anterior cervical discectomy and fusion allograft and plate.  Previous x-rays showed  solid fusion.  He continues to have problems with his right shoulder and states his neck is feeling much better but he continues to have pain with outstretched reaching overhead activity and points anteriorly over the biceps tendon where he has pain.  He is taken diclofenac with slight relief.  He has been through therapy exercises without improvement.  MRI scan was reviewed with the patient which showed type II SLAP tear with biceps tendinopathy present involving the intra-articular portion of the shoulder.  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  Some problems with dressing washing his hair change and closed.  Does not wake him up at night when he sleeps.  Patient did have some residual mild changes at C5-6 C6-7 without compression but no radicular symptoms related to this.  Patient states his shoulder is bottoming at work on a daily basis and he wants to consider surgical intervention.  Review of Systems review of systems is updated unchanged from 11/30/2017 office visit.  Previous lumbar surgery with fusion L5-S1 2005 doing well.  Upper lipidemia restless leg syndrome former smoker.  History of depression.  History of alcohol problems in the past he does not drink.  14 point review of systems otherwise negative.  Objective: Vital Signs: BP 127/79   Pulse 71   Ht 5\' 10"  (1.778 m)   Wt 205 lb (93 kg)   BMI 29.41 kg/m   Physical Exam  Constitutional: He is oriented to person, place, and time. He appears well-developed and well-nourished.  HENT:  Head: Normocephalic and atraumatic.  Eyes: Pupils are equal, round, and reactive to light. EOM are normal.  Neck: No tracheal deviation present. No thyromegaly present.  Cardiovascular: Normal rate.  Pulmonary/Chest: Effort normal. He has no wheezes.  Abdominal: Soft. Bowel sounds are normal.  Neurological: He is  alert and oriented to person, place, and time.  Skin: Skin is warm and dry. Capillary refill takes less than 2 seconds.  Psychiatric: He has a normal mood and affect. His behavior is normal. Judgment and thought content normal.    Ortho Exam well-healed anterior incision no brachial plexus tenderness negative Spurling negative Lhermitte.  Anterior right shoulder shows exquisite tenderness over the long head biceps tendon with flexion to 90 and rotation he has sharp pain points anteriorly over the shoulder particularly against resistance.  Negative Yergason test.  Subscap shoulder external rotators and supraspinatus isolated testing shows negative drop arm test and symmetrical strength.  Pain with resisted elbow flexion and he points again to the anterior bicipital groove.  No evidence of median nerve compression in the forearm or wrist ulnar nerve the elbow is normal lower extremity reflexes are 2+ normal heel toe gait.  Specialty Comments:  No specialty comments available.  Imaging: CLINICAL DATA:  Right shoulder pain, weakness and limited range of motion for 6 months. No known injury.  EXAM: MRI OF THE RIGHT SHOULDER WITHOUT CONTRAST  TECHNIQUE: Multiplanar, multisequence MR imaging of the shoulder was performed. No intravenous contrast was administered.  COMPARISON:  None.  FINDINGS: Rotator cuff: There is thickening and increased T2 signal in the supraspinatus, infraspinatus and subscapularis tendons consistent with tendinopathy. No tear is identified.  Muscles:  Normal without atrophy or focal lesion.  Biceps long head: Intact. Mild intrasubstance increased T2 signal within the intra-articular segment of the tendon consistent with tendinopathy is identified.  Acromioclavicular Joint: Moderately severe degenerative change is present. Type 1 acromion. Fluid is seen in the subacromial/subdeltoid bursa consistent with bursitis.  Glenohumeral Joint:  Preserved.  Labrum: Abnormal intrasubstance increased T2 signal in the superior labrum extending from anterior to the biceps tendon attachment posteriorly to the 11 o'clock position is consistent with a SLAP tear.  Bones:  No fracture or worrisome lesion.  Other: None.  IMPRESSION: Rotator cuff tendinopathy without tear. More mild appearing tendinopathy of the intra-articular long head of biceps without tear is also identified.  SLAP tear appears to be a type 2 lesion.  Moderately severe acromioclavicular osteoarthritis.  Subacromial/subdeltoid bursitis.   Electronically Signed   By: Inge Rise M.D.   On: 12/17/2017 08:27   PMFS History: Patient Active Problem List   Diagnosis Date Noted  . History of fusion of cervical spine 12/28/2017  . Status post lumbar spinal fusion 11/15/2017  . RESTLESS LEGS SYNDROME 08/02/2009  . HYPERLIPIDEMIA 07/23/2009  . OBSTRUCTIVE SLEEP APNEA 07/23/2009   Past Medical History:  Diagnosis Date  . Anxiety    some panic attacks- off medications  . Arthritis   . Complication of anesthesia    woke up during surgery- Elbow surgery, 2009 and again with foot surgeryry   . GERD (gastroesophageal reflux disease)    once in awhile  . Hypercholesteremia   . Sleep apnea  has lost 'abunch of weight' its been yrs since i used it    No family history on file.  Past Surgical History:  Procedure Laterality Date  . ANTERIOR CERVICAL DECOMP/DISCECTOMY FUSION N/A 12/21/2017   Procedure: C3-4 ANTERIOR CERVICAL DECOMPRESSION/DISCECTOMY FUSION, ALLOGRAFT, PLATE;  Surgeon: Marybelle Killings, MD;  Location: [facility];  Service: Orthopedics;  Laterality: N/A;  . FOOT SURGERY    . left elbow     . LUMBAR LAMINECTOMY     back in 2015   Social History   Occupational History  . Not on file  Tobacco Use  . Smoking status: Former Smoker    Years: 12.00  . Smokeless tobacco: Never Used  Substance and Sexual Activity  . Alcohol use: No     Comment: recover alcolol 1985  . Drug use: No  . Sexual activity: Not on file    Comment: quit  1986

## 2018-04-14 ENCOUNTER — Ambulatory Visit (HOSPITAL_COMMUNITY): Admit: 2018-04-14 | Payer: BLUE CROSS/BLUE SHIELD | Admitting: Gastroenterology

## 2018-04-14 ENCOUNTER — Encounter (HOSPITAL_COMMUNITY): Payer: Self-pay

## 2018-04-14 SURGERY — COLONOSCOPY
Anesthesia: Moderate Sedation

## 2018-04-28 ENCOUNTER — Ambulatory Visit (INDEPENDENT_AMBULATORY_CARE_PROVIDER_SITE_OTHER): Payer: BLUE CROSS/BLUE SHIELD | Admitting: Orthopaedic Surgery

## 2018-05-09 ENCOUNTER — Telehealth: Payer: Self-pay | Admitting: *Deleted

## 2018-05-09 ENCOUNTER — Encounter: Payer: Self-pay | Admitting: Gastroenterology

## 2018-05-09 ENCOUNTER — Other Ambulatory Visit: Payer: Self-pay | Admitting: *Deleted

## 2018-05-09 ENCOUNTER — Ambulatory Visit (INDEPENDENT_AMBULATORY_CARE_PROVIDER_SITE_OTHER): Payer: BLUE CROSS/BLUE SHIELD | Admitting: Gastroenterology

## 2018-05-09 ENCOUNTER — Encounter: Payer: Self-pay | Admitting: *Deleted

## 2018-05-09 VITALS — BP 129/88 | HR 70 | Temp 97.2°F | Ht 70.0 in | Wt 217.8 lb

## 2018-05-09 DIAGNOSIS — Z1211 Encounter for screening for malignant neoplasm of colon: Secondary | ICD-10-CM

## 2018-05-09 DIAGNOSIS — T4145XA Adverse effect of unspecified anesthetic, initial encounter: Secondary | ICD-10-CM | POA: Insufficient documentation

## 2018-05-09 DIAGNOSIS — T8859XD Other complications of anesthesia, subsequent encounter: Secondary | ICD-10-CM

## 2018-05-09 DIAGNOSIS — R632 Polyphagia: Secondary | ICD-10-CM | POA: Insufficient documentation

## 2018-05-09 DIAGNOSIS — T4145XD Adverse effect of unspecified anesthetic, subsequent encounter: Secondary | ICD-10-CM

## 2018-05-09 DIAGNOSIS — T8859XA Other complications of anesthesia, initial encounter: Secondary | ICD-10-CM | POA: Insufficient documentation

## 2018-05-09 NOTE — Progress Notes (Signed)
Primary Care Physician:  Asencion Noble, MD  Primary Gastroenterologist:  Barney Drain, MD   Chief Complaint  Patient presents with  . Consult    TCS-needs to be done in the OR per 02/09/18 TE. Last had done at age 62    HPI:  [name] is a 62 y.o. male here to schedule screening colonoscopy.  There were concerns about complications of anesthesia in the past.  Her past medical history he woke up during his elbow surgery and foot surgery.  Dr. Oneida Alar recommended pursuing colonoscopy with monitored anesthesia care.  Patient is agreeable.  Any constipation, diarrhea, melena, rectal bleeding.  No abdominal pain.  No heartburn, vomiting, dysphagia.  Current Outpatient Medications  Medication Sig Dispense Refill  . ALPRAZolam (XANAX) 0.25 MG tablet Take 0.25 mg by mouth at bedtime. Takes one at bedtime nightly     . atorvastatin (LIPITOR) 40 MG tablet Take 40 mg by mouth daily.    . diclofenac (VOLTAREN) 75 MG EC tablet Take 75 mg by mouth 2 (two) times daily.    Marland Kitchen escitalopram (LEXAPRO) 20 MG tablet Take 20 mg by mouth daily.     No current facility-administered medications for this visit.     Allergies as of 05/09/2018 - Review Complete 05/09/2018  Allergen Reaction Noted  . Codeine Other (See Comments)   . Meloxicam Itching 03/14/2013    Past Medical History:  Diagnosis Date  . Anxiety    some panic attacks- off medications  . Arthritis   . Complication of anesthesia    woke up during surgery- Elbow surgery, 2009 and again with foot surgeryry   . GERD (gastroesophageal reflux disease)    once in awhile  . Hypercholesteremia   . Sleep apnea    has lost 'abunch of weight' its been yrs since i used it    Past Surgical History:  Procedure Laterality Date  . ANTERIOR CERVICAL DECOMP/DISCECTOMY FUSION N/A 12/21/2017   Procedure: C3-4 ANTERIOR CERVICAL DECOMPRESSION/DISCECTOMY FUSION, ALLOGRAFT, PLATE;  Surgeon: Marybelle Killings, MD;  Location: [facility];  Service: Orthopedics;   Laterality: N/A;  . COLONOSCOPY  2008   Dr. Oneida Alar: Hyperplastic polyp removed, diverticulosis.  Marland Kitchen FOOT SURGERY    . left elbow     . LUMBAR LAMINECTOMY     back in 2015    Family History  Problem Relation Age of Onset  . Heart disease Mother   . Heart disease Father   . Other Cousin        colostomy  . Brain cancer Paternal Uncle   . Lung cancer Brother     Social History   Socioeconomic History  . Marital status: Divorced    Spouse name: Not on file  . Number of children: Not on file  . Years of education: Not on file  . Highest education level: Not on file  Occupational History  . Not on file  Social Needs  . Financial resource strain: Not on file  . Food insecurity:    Worry: Not on file    Inability: Not on file  . Transportation needs:    Medical: Not on file    Non-medical: Not on file  Tobacco Use  . Smoking status: Former Smoker    Years: 12.00  . Smokeless tobacco: Never Used  Substance and Sexual Activity  . Alcohol use: No    Comment: recover alcolol 1985  . Drug use: No  . Sexual activity: Not on file    Comment: quit  1986  Lifestyle  . Physical activity:    Days per week: Not on file    Minutes per session: Not on file  . Stress: Not on file  Relationships  . Social connections:    Talks on phone: Not on file    Gets together: Not on file    Attends religious service: Not on file    Active member of club or organization: Not on file    Attends meetings of clubs or organizations: Not on file    Relationship status: Not on file  . Intimate partner violence:    Fear of current or ex partner: Not on file    Emotionally abused: Not on file    Physically abused: Not on file    Forced sexual activity: Not on file  Other Topics Concern  . Not on file  Social History Narrative  . Not on file      ROS:  General: Negative for anorexia, weight loss, fever, chills, fatigue, weakness. Eyes: Negative for vision changes.  ENT: Negative for  hoarseness, difficulty swallowing , nasal congestion. CV: Negative for chest pain, angina, palpitations, dyspnea on exertion, peripheral edema.  Respiratory: Negative for dyspnea at rest, dyspnea on exertion, cough, sputum, wheezing.  GI: See history of present illness. GU:  Negative for dysuria, hematuria, urinary incontinence, urinary frequency, nocturnal urination.  MS: Negative for joint pain, low back pain.  Derm: Negative for rash or itching.  Neuro: Negative for weakness, abnormal sensation, seizure, frequent headaches, memory loss, confusion.  Psych: Negative for anxiety, depression, suicidal ideation, hallucinations.  Endo: Negative for unusual weight change.  Heme: Negative for bruising or bleeding. Allergy: Negative for rash or hives.    Physical Examination:  BP 129/88   Pulse 70   Temp (!) 97.2 F (36.2 C) (Oral)   Ht 5\' 10"  (1.778 m)   Wt 217 lb 12.8 oz (98.8 kg)   BMI 31.25 kg/m    General: Well-nourished, well-developed in no acute distress.  Head: Normocephalic, atraumatic.   Eyes: Conjunctiva pink, no icterus. Mouth: Oropharyngeal mucosa moist and pink , no lesions erythema or exudate. Neck: Supple without thyromegaly, masses, or lymphadenopathy.  Lungs: Clear to auscultation bilaterally.  Heart: Regular rate and rhythm, no murmurs rubs or gallops.  Abdomen: Bowel sounds are normal, nontender, nondistended, no hepatosplenomegaly or masses, no abdominal bruits or    hernia , no rebound or guarding.   Rectal: Not performed Extremities: No lower extremity edema. No clubbing or deformities.  Neuro: Alert and oriented x 4 , grossly normal neurologically.  Skin: Warm and dry, no rash or jaundice.   Psych: Alert and cooperative, normal mood and affect.  Labs: Lab Results  Component Value Date   CREATININE 1.08 12/15/2017   BUN 11 12/15/2017   NA 136 12/15/2017   K 4.2 12/15/2017   CL 101 12/15/2017   CO2 25 12/15/2017   Lab Results  Component Value Date    ALT 23 12/15/2017   AST 22 12/15/2017   ALKPHOS 59 12/15/2017   BILITOT 1.0 12/15/2017   Lab Results  Component Value Date   WBC 7.5 12/15/2017   HGB 14.9 12/15/2017   HCT 44.0 12/15/2017   MCV 82.1 12/15/2017   PLT 277 12/15/2017     Imaging Studies: No results found.

## 2018-05-09 NOTE — Telephone Encounter (Signed)
Pre-op scheduled for 07/04/18 at 11:00am. Letter mailed. LMOVM.

## 2018-05-09 NOTE — Assessment & Plan Note (Signed)
62 year old gentleman presenting for screening colonoscopy.  According to the medical history he has had issues of waking up while under anesthesia.  Dr. Oneida Alar recommends monitor anesthesia care for colonoscopy based on his history.  Patient agreeable. I have discussed the risks, alternatives, benefits with regards to but not limited to the risk of reaction to medication, bleeding, infection, perforation and the patient is agreeable to proceed. Written consent to be obtained.

## 2018-05-09 NOTE — Patient Instructions (Signed)
Colonoscopy as scheduled. See separate instructions.  

## 2018-05-10 NOTE — Progress Notes (Signed)
CC'D TO PCP °

## 2018-05-16 ENCOUNTER — Ambulatory Visit (INDEPENDENT_AMBULATORY_CARE_PROVIDER_SITE_OTHER): Payer: BLUE CROSS/BLUE SHIELD | Admitting: Orthopaedic Surgery

## 2018-05-16 ENCOUNTER — Encounter (INDEPENDENT_AMBULATORY_CARE_PROVIDER_SITE_OTHER): Payer: Self-pay | Admitting: Orthopaedic Surgery

## 2018-05-16 VITALS — BP 134/87 | HR 66

## 2018-05-16 DIAGNOSIS — S43431A Superior glenoid labrum lesion of right shoulder, initial encounter: Secondary | ICD-10-CM | POA: Diagnosis not present

## 2018-05-16 NOTE — Progress Notes (Signed)
Office Visit Note   Patient: [name]           Date of Birth: 1955-10-12           MRN: [id number] Visit Date: 05/16/2018              Requested by: Asencion Noble, MD [address] [place], [place] [address] PCP: Asencion Noble, MD   Assessment & Plan: Visit Diagnoses:  1. Superior labrum anterior-to-posterior (SLAP) tear of right shoulder     Plan: Patient has a symptomatic right shoulder SLAP type II tear.  He is taken anti-inflammatories intra-articular injections with persistent symptoms.  We again discussed outpatient shoulder arthroscopy with biceps tenodesis with a preoperative interscalene block so he could be an outpatient procedure.  Expected length of time out of work could be in the 4 to 6-week range for he could be doing modified work in 6 to 8-week range for he can do some pulling with his arm.  Patient understands and requests we proceed.  MRI scan and report was reviewed with him again today.  Follow-Up Instructions: No follow-ups on file.   Orders:  No orders of the defined types were placed in this encounter.  No orders of the defined types were placed in this encounter.     Procedures: No procedures performed   Clinical Data: No additional findings.   Subjective: Chief Complaint  Patient presents with  . Right Shoulder - Pain, Follow-up    HPI 62 year old male returns with persistent problems with his right shoulder.  He has had previous C3-4 cervical fusion which is solid.  MRI scan showed a SLAP type II tear of his right shoulder and he said previous treatment including exercise program, anti-inflammatories with diclofenac, cortisone injection without relief.  He has problems with dressing washing his hair change and closed as well as pain with work activity.  Review of Systems 14 point review of systems updated unchanged from 11/30/2017.  Previous L5-S1 2005 surgery which is solid fusion good relief of pain.  Previous cervical fusion.  History  of depression and hyperlipidemia as well as what restless leg syndrome.  Former smoker not currently smoking.   Objective: Vital Signs: BP 134/87   Pulse 66   Physical Exam  Constitutional: He is oriented to person, place, and time. He appears well-developed and well-nourished.  HENT:  Head: Normocephalic and atraumatic.  Eyes: Pupils are equal, round, and reactive to light. EOM are normal.  Neck: No tracheal deviation present. No thyromegaly present.  Cardiovascular: Normal rate.  Pulmonary/Chest: Effort normal. He has no wheezes.  Abdominal: Soft. Bowel sounds are normal.  Neurological: He is alert and oriented to person, place, and time.  Skin: Skin is warm and dry. Capillary refill takes less than 2 seconds.  Psychiatric: He has a normal mood and affect. His behavior is normal. Judgment and thought content normal.    Ortho Exam healed anterior cervical incision and lumbar surgery incision without tenderness.  Reflexes are intact.  Pain over the right anterior biceps tendon. He has some pain with impingement test.  Pain over the anterior biceps tendon and pain with resisted biceps testing. Specialty Comments:  No specialty comments available.  Imaging: CLINICAL DATA:  Right shoulder pain, weakness and limited range of motion for 6 months. No known injury.  EXAM: MRI OF THE RIGHT SHOULDER WITHOUT CONTRAST  TECHNIQUE: Multiplanar, multisequence MR imaging of the shoulder was performed. No intravenous contrast was administered.  COMPARISON:  None.  FINDINGS: Rotator cuff: There is thickening and increased T2 signal in the supraspinatus, infraspinatus and subscapularis tendons consistent with tendinopathy. No tear is identified.  Muscles:  Normal without atrophy or focal lesion.  Biceps long head: Intact. Mild intrasubstance increased T2 signal within the intra-articular segment of the tendon consistent with tendinopathy is identified.  Acromioclavicular Joint:  Moderately severe degenerative change is present. Type 1 acromion. Fluid is seen in the subacromial/subdeltoid bursa consistent with bursitis.  Glenohumeral Joint: Preserved.  Labrum: Abnormal intrasubstance increased T2 signal in the superior labrum extending from anterior to the biceps tendon attachment posteriorly to the 11 o'clock position is consistent with a SLAP tear.  Bones:  No fracture or worrisome lesion.  Other: None.  IMPRESSION: Rotator cuff tendinopathy without tear. More mild appearing tendinopathy of the intra-articular long head of biceps without tear is also identified.  SLAP tear appears to be a type 2 lesion.  Moderately severe acromioclavicular osteoarthritis.  Subacromial/subdeltoid bursitis.   Electronically Signed   By: Inge Rise M.D.   On: 12/17/2017 08:27   PMFS History: Patient Active Problem List   Diagnosis Date Noted  . Encounter for screening colonoscopy 05/09/2018  . Polyphagia 05/09/2018  . Complication of anesthesia 05/09/2018  . Superior labrum anterior-to-posterior (SLAP) tear of right shoulder 03/29/2018  . History of fusion of cervical spine 12/28/2017  . Status post lumbar spinal fusion 11/15/2017  . RESTLESS LEGS SYNDROME 08/02/2009  . HYPERLIPIDEMIA 07/23/2009  . OBSTRUCTIVE SLEEP APNEA 07/23/2009   Past Medical History:  Diagnosis Date  . Anxiety    some panic attacks- off medications  . Arthritis   . Complication of anesthesia    woke up during surgery- Elbow surgery, 2009 and again with foot surgeryry   . GERD (gastroesophageal reflux disease)    once in awhile  . Hypercholesteremia   . Sleep apnea    has lost 'abunch of weight' its been yrs since i used it    Family History  Problem Relation Age of Onset  . Heart disease Mother   . Heart disease Father   . Other Cousin        colostomy  . Brain cancer Paternal Uncle   . Lung cancer Brother     Past Surgical History:  Procedure Laterality  Date  . ANTERIOR CERVICAL DECOMP/DISCECTOMY FUSION N/A 12/21/2017   Procedure: C3-4 ANTERIOR CERVICAL DECOMPRESSION/DISCECTOMY FUSION, ALLOGRAFT, PLATE;  Surgeon: Marybelle Killings, MD;  Location: [facility];  Service: Orthopedics;  Laterality: N/A;  . COLONOSCOPY  2008   Dr. Oneida Alar: Hyperplastic polyp removed, diverticulosis.  Marland Kitchen FOOT SURGERY    . left elbow     . LUMBAR LAMINECTOMY     back in 2015   Social History   Occupational History  . Not on file  Tobacco Use  . Smoking status: Former Smoker    Years: 12.00  . Smokeless tobacco: Never Used  Substance and Sexual Activity  . Alcohol use: No    Comment: recover alcolol 1985  . Drug use: No  . Sexual activity: Not on file    Comment: quit  1986

## 2018-05-23 ENCOUNTER — Encounter (INDEPENDENT_AMBULATORY_CARE_PROVIDER_SITE_OTHER): Payer: Self-pay | Admitting: Orthopaedic Surgery

## 2018-06-19 DIAGNOSIS — M75111 Incomplete rotator cuff tear or rupture of right shoulder, not specified as traumatic: Secondary | ICD-10-CM | POA: Diagnosis not present

## 2018-06-19 DIAGNOSIS — S43431D Superior glenoid labrum lesion of right shoulder, subsequent encounter: Secondary | ICD-10-CM | POA: Diagnosis not present

## 2018-06-19 DIAGNOSIS — S46121D Laceration of muscle, fascia and tendon of long head of biceps, right arm, subsequent encounter: Secondary | ICD-10-CM | POA: Diagnosis not present

## 2018-06-28 ENCOUNTER — Inpatient Hospital Stay (INDEPENDENT_AMBULATORY_CARE_PROVIDER_SITE_OTHER): Payer: BLUE CROSS/BLUE SHIELD | Admitting: Orthopaedic Surgery

## 2018-06-30 ENCOUNTER — Ambulatory Visit (INDEPENDENT_AMBULATORY_CARE_PROVIDER_SITE_OTHER): Payer: BLUE CROSS/BLUE SHIELD | Admitting: Orthopaedic Surgery

## 2018-06-30 ENCOUNTER — Encounter (INDEPENDENT_AMBULATORY_CARE_PROVIDER_SITE_OTHER): Payer: Self-pay | Admitting: Orthopaedic Surgery

## 2018-06-30 VITALS — BP 119/79 | HR 74 | Ht 70.0 in | Wt 218.0 lb

## 2018-06-30 DIAGNOSIS — S43431A Superior glenoid labrum lesion of right shoulder, initial encounter: Secondary | ICD-10-CM

## 2018-06-30 NOTE — Progress Notes (Signed)
   Post-Op Visit Note   Patient: [name]           Date of Birth: 06-24-1956           MRN: [id number] Visit Date: 06/30/2018 PCP: Asencion Noble, MD   Assessment & Plan: 62 year old male post right shoulder rotator cuff debridement debridement labral tear debridement partial biceps tendon release with biceps Bio-Tenodesis.  Chief Complaint:  Chief Complaint  Patient presents with  . Right Shoulder - Routine Post Op   Visit Diagnoses:  1. Superior labrum anterior-to-posterior (SLAP) tear of right shoulder      Post biceps tenodesis 06/19/2018.  Plan: He will return in 1 month.  Continue shoulder range of motion and strengthening exercises.  Will discuss work resumption on return visit.  Follow-Up Instructions: Return in about 1 month (around 07/31/2018), or if symptoms worsen or fail to improve.   Orders:  No orders of the defined types were placed in this encounter.  No orders of the defined types were placed in this encounter.   Imaging: No results found.  PMFS History: Patient Active Problem List   Diagnosis Date Noted  . Encounter for screening colonoscopy 05/09/2018  . Polyphagia 05/09/2018  . Complication of anesthesia 05/09/2018  . Superior labrum anterior-to-posterior (SLAP) tear of right shoulder 03/29/2018  . History of fusion of cervical spine 12/28/2017  . Status post lumbar spinal fusion 11/15/2017  . RESTLESS LEGS SYNDROME 08/02/2009  . HYPERLIPIDEMIA 07/23/2009  . OBSTRUCTIVE SLEEP APNEA 07/23/2009   Past Medical History:  Diagnosis Date  . Anxiety    some panic attacks- off medications  . Arthritis   . Complication of anesthesia    woke up during surgery- Elbow surgery, 2009 and again with foot surgeryry   . GERD (gastroesophageal reflux disease)    once in awhile  . Hypercholesteremia   . Sleep apnea    has lost 'abunch of weight' its been yrs since i used it    Family History  Problem Relation Age of Onset  . Heart disease Mother   .  Heart disease Father   . Other Cousin        colostomy  . Brain cancer Paternal Uncle   . Lung cancer Brother     Past Surgical History:  Procedure Laterality Date  . ANTERIOR CERVICAL DECOMP/DISCECTOMY FUSION N/A 12/21/2017   Procedure: C3-4 ANTERIOR CERVICAL DECOMPRESSION/DISCECTOMY FUSION, ALLOGRAFT, PLATE;  Surgeon: Marybelle Killings, MD;  Location: [facility];  Service: Orthopedics;  Laterality: N/A;  . COLONOSCOPY  2008   Dr. Oneida Alar: Hyperplastic polyp removed, diverticulosis.  Marland Kitchen FOOT SURGERY    . left elbow     . LUMBAR LAMINECTOMY     back in 2015   Social History   Occupational History  . Not on file  Tobacco Use  . Smoking status: Former Smoker    Years: 12.00  . Smokeless tobacco: Never Used  Substance and Sexual Activity  . Alcohol use: No    Comment: recover alcolol 1985  . Drug use: No  . Sexual activity: Not on file    Comment: quit  1986

## 2018-07-03 ENCOUNTER — Encounter (HOSPITAL_COMMUNITY): Payer: Self-pay

## 2018-07-04 ENCOUNTER — Encounter (HOSPITAL_COMMUNITY)
Admission: RE | Admit: 2018-07-04 | Discharge: 2018-07-04 | Disposition: A | Discharge status: Home / Self Care | Payer: BLUE CROSS/BLUE SHIELD | Source: Ambulatory Visit | Attending: Gastroenterology | Admitting: Gastroenterology

## 2018-07-11 ENCOUNTER — Ambulatory Visit (HOSPITAL_COMMUNITY): Payer: BLUE CROSS/BLUE SHIELD | Admitting: Anesthesiology

## 2018-07-11 ENCOUNTER — Encounter (HOSPITAL_COMMUNITY): Payer: Self-pay | Admitting: *Deleted

## 2018-07-11 ENCOUNTER — Encounter (HOSPITAL_COMMUNITY)
Admission: RE | Disposition: A | Discharge status: Home / Self Care | Payer: Self-pay | Source: Home / Self Care | Attending: Gastroenterology

## 2018-07-11 ENCOUNTER — Other Ambulatory Visit: Payer: Self-pay

## 2018-07-11 ENCOUNTER — Ambulatory Visit (HOSPITAL_COMMUNITY)
Admission: RE | Admit: 2018-07-11 | Discharge: 2018-07-11 | Disposition: A | Discharge status: Home / Self Care | Payer: BLUE CROSS/BLUE SHIELD | Attending: Gastroenterology | Admitting: Gastroenterology

## 2018-07-11 DIAGNOSIS — F419 Anxiety disorder, unspecified: Secondary | ICD-10-CM | POA: Diagnosis not present

## 2018-07-11 DIAGNOSIS — Z87891 Personal history of nicotine dependence: Secondary | ICD-10-CM | POA: Insufficient documentation

## 2018-07-11 DIAGNOSIS — E7800 Pure hypercholesterolemia, unspecified: Secondary | ICD-10-CM | POA: Insufficient documentation

## 2018-07-11 DIAGNOSIS — Z791 Long term (current) use of non-steroidal anti-inflammatories (NSAID): Secondary | ICD-10-CM | POA: Insufficient documentation

## 2018-07-11 DIAGNOSIS — K635 Polyp of colon: Secondary | ICD-10-CM | POA: Insufficient documentation

## 2018-07-11 DIAGNOSIS — K644 Residual hemorrhoidal skin tags: Secondary | ICD-10-CM | POA: Insufficient documentation

## 2018-07-11 DIAGNOSIS — Z1211 Encounter for screening for malignant neoplasm of colon: Secondary | ICD-10-CM

## 2018-07-11 DIAGNOSIS — Z79899 Other long term (current) drug therapy: Secondary | ICD-10-CM | POA: Insufficient documentation

## 2018-07-11 DIAGNOSIS — K219 Gastro-esophageal reflux disease without esophagitis: Secondary | ICD-10-CM | POA: Insufficient documentation

## 2018-07-11 DIAGNOSIS — D123 Benign neoplasm of transverse colon: Secondary | ICD-10-CM

## 2018-07-11 DIAGNOSIS — M1990 Unspecified osteoarthritis, unspecified site: Secondary | ICD-10-CM | POA: Diagnosis not present

## 2018-07-11 DIAGNOSIS — K5730 Diverticulosis of large intestine without perforation or abscess without bleeding: Secondary | ICD-10-CM

## 2018-07-11 DIAGNOSIS — G4730 Sleep apnea, unspecified: Secondary | ICD-10-CM | POA: Insufficient documentation

## 2018-07-11 DIAGNOSIS — K648 Other hemorrhoids: Secondary | ICD-10-CM

## 2018-07-11 HISTORY — PX: POLYPECTOMY: SHX5525

## 2018-07-11 HISTORY — PX: COLONOSCOPY WITH PROPOFOL: SHX5780

## 2018-07-11 SURGERY — COLONOSCOPY WITH PROPOFOL
Anesthesia: General

## 2018-07-11 MED ORDER — PROPOFOL 500 MG/50ML IV EMUL
INTRAVENOUS | Status: DC | PRN
  Administered 2018-07-11: 150 ug/kg/min via INTRAVENOUS
  Administered 2018-07-11: 100 ug/kg/min via INTRAVENOUS

## 2018-07-11 MED ORDER — PROPOFOL 10 MG/ML IV BOLUS
INTRAVENOUS | Status: AC
  Filled 2018-07-11: qty 120

## 2018-07-11 MED ORDER — EPHEDRINE SULFATE 50 MG/ML IJ SOLN
INTRAMUSCULAR | Status: DC | PRN
  Administered 2018-07-11: 10 mg via INTRAVENOUS
  Administered 2018-07-11: 5 mg via INTRAVENOUS
  Administered 2018-07-11: 10 mg via INTRAVENOUS

## 2018-07-11 MED ORDER — MIDAZOLAM HCL 2 MG/2ML IJ SOLN: 0.5000 mg | Freq: Once | INTRAMUSCULAR | Status: DC | PRN

## 2018-07-11 MED ORDER — CHLORHEXIDINE GLUCONATE CLOTH 2 % EX PADS: 6.0000 | MEDICATED_PAD | Freq: Once | CUTANEOUS | Status: DC

## 2018-07-11 MED ORDER — LACTATED RINGERS IV SOLN: INTRAVENOUS | Status: DC

## 2018-07-11 MED ORDER — LACTATED RINGERS IV SOLN
INTRAVENOUS | Status: DC | PRN
  Administered 2018-07-11: 07:00:00 via INTRAVENOUS

## 2018-07-11 MED ORDER — KETAMINE HCL 50 MG/5ML IJ SOSY
PREFILLED_SYRINGE | INTRAMUSCULAR | Status: AC
  Filled 2018-07-11: qty 5

## 2018-07-11 MED ORDER — PROMETHAZINE HCL 25 MG/ML IJ SOLN: 6.2500 mg | INTRAMUSCULAR | Status: DC | PRN

## 2018-07-11 MED ORDER — MIDAZOLAM HCL 2 MG/2ML IJ SOLN
INTRAMUSCULAR | Status: AC
  Filled 2018-07-11: qty 2

## 2018-07-11 MED ORDER — PROPOFOL 10 MG/ML IV BOLUS
INTRAVENOUS | Status: DC | PRN
  Administered 2018-07-11: 20 mg via INTRAVENOUS
  Administered 2018-07-11: 40 mg via INTRAVENOUS
  Administered 2018-07-11 (×2): 20 mg via INTRAVENOUS
  Administered 2018-07-11: 40 mg via INTRAVENOUS

## 2018-07-11 NOTE — Op Note (Signed)
[facility] Patient Name: [name] Procedure Date: 07/11/2018 7:42 AM MRN: [id number] Date of Birth: [birth year] Attending MD: Barney Drain MD, MD CSN: [id number] Age: 62 Admit Type: Outpatient Procedure:                Colonoscopy WITH COLD SNARE POLYPECTOMY Indications:              Screening for colorectal malignant neoplasm Providers:                Barney Drain MD, MD, Otis Peak B. Sharon Seller, RN, Nelma Rothman, Technician Referring MD:             Asencion Noble Medicines:                Propofol per Anesthesia Complications:            No immediate complications. Estimated Blood Loss:     Estimated blood loss was minimal. Procedure:                Pre-Anesthesia Assessment:                           - Prior to the procedure, a History and Physical                            was performed, and patient medications and                            allergies were reviewed. The patient's tolerance of                            previous anesthesia was also reviewed. The risks                            and benefits of the procedure and the sedation                            options and risks were discussed with the patient.                            All questions were answered, and informed consent                            was obtained. Prior Anticoagulants: The patient has                            taken no previous anticoagulant or antiplatelet                            agents. ASA Grade Assessment: II - A patient with                            mild systemic disease. After reviewing the risks  and benefits, the patient was deemed in                            satisfactory condition to undergo the procedure.                            After obtaining informed consent, the colonoscope                            was passed under direct vision. Throughout the                            procedure, the patient's blood pressure, pulse,  and                            oxygen saturations were monitored continuously. The                            CF-[id number] ([id number]) scope was introduced through                            the anus and advanced to the the cecum, identified                            by appendiceal orifice and ileocecal valve. The                            colonoscopy was somewhat difficult due to a                            tortuous colon. Successful completion of the                            procedure was aided by straightening and shortening                            the scope to obtain bowel loop reduction and                            COLOWRAP. The ileocecal valve, appendiceal orifice,                            and rectum were photographed. The patient tolerated                            the procedure well. The quality of the bowel                            preparation was good. Scope In: 7:44:01 AM Scope Out: 8:10:44 AM Scope Withdrawal Time: 0 hours 23 minutes 30 seconds  Total Procedure Duration: 0 hours 26 minutes 43 seconds  Findings:      Three sessile polyps were found in the splenic flexure and transverse       colon. The polyps  were 2 to 5 mm in size. These polyps were removed with       a cold snare. Resection and retrieval were complete.      Multiple small and large-mouthed diverticula were found in the       recto-sigmoid colon and sigmoid colon.      Internal hemorrhoids were found during retroflexion. The hemorrhoids       were small.      External hemorrhoids were found. The hemorrhoids were large.      The recto-sigmoid colon and sigmoid colon were mildly tortuous. Impression:               - Three 2 to 5 mm polyps at the splenic flexure and                            in the transverse colon, removed with a cold snare.                            Resected and retrieved.                           - Diverticulosis in the recto-sigmoid colon and in                            the  sigmoid colon.                           - Internal hemorrhoids.                           - External hemorrhoids.                           - Tortuous LEFT colon. Moderate Sedation:      Per Anesthesia Care Recommendation:           - Patient has a contact number available for                            emergencies. The signs and symptoms of potential                            delayed complications were discussed with the                            patient. Return to normal activities tomorrow.                            Written discharge instructions were provided to the                            patient.                           - High fiber diet.                           - Continue present medications.                           -  Await pathology results.                           - Repeat colonoscopy in 3 years for surveillance. Procedure Code(s):        --- Professional ---                           [phone], Colonoscopy, flexible; with removal of                            tumor(s), polyp(s), or other lesion(s) by snare                            technique Diagnosis Code(s):        --- Professional ---                           Z12.11, Encounter for screening for malignant                            neoplasm of colon                           D12.3, Benign neoplasm of transverse colon (hepatic                            flexure or splenic flexure)                           K64.4, Residual hemorrhoidal skin tags                           K64.8, Other hemorrhoids                           K57.30, Diverticulosis of large intestine without                            perforation or abscess without bleeding                           Q43.8, Other specified congenital malformations of                            intestine CPT copyright 2018 American [employer] Association. All rights reserved. The codes documented in this report are preliminary and upon coder review may  be revised to  meet current compliance requirements. Barney Drain, MD Barney Drain MD, MD 07/11/2018 8:30:00 AM This report has been signed electronically. Number of Addenda: 0

## 2018-07-11 NOTE — Transfer of Care (Signed)
Immediate Anesthesia Transfer of Care Note  Patient: [name]  Procedure(s) Performed: COLONOSCOPY WITH PROPOFOL (N/A ) POLYPECTOMY  Patient Location: PACU  Anesthesia Type:General  Level of Consciousness: awake, alert  and patient cooperative  Airway & Oxygen Therapy: Patient Spontanous Breathing  Post-op Assessment: Report given to RN and Post -op Vital signs reviewed and stable  Post vital signs: Reviewed and stable  Last Vitals:  Vitals Value Taken Time  BP    Temp    Pulse    Resp    SpO2      Last Pain:  Vitals:   07/11/18 0736  TempSrc:   PainSc: 0-No pain         Complications: No apparent anesthesia complications

## 2018-07-11 NOTE — H&P (Signed)
Primary Care Physician:  Asencion Noble, MD Primary Gastroenterologist:  Dr. Oneida Alar  Pre-Procedure History & Physical: HPI:  [name] is a 62 y.o. male here for [place].  Past Medical History:  Diagnosis Date  . Anxiety    some panic attacks- off medications  . Arthritis   . Complication of anesthesia    woke up during surgery- Elbow surgery, 2009 and again with foot surgeryry   . GERD (gastroesophageal reflux disease)    once in awhile  . Hypercholesteremia   . Sleep apnea    has lost 'abunch of weight' its been yrs since i used it    Past Surgical History:  Procedure Laterality Date  . ANTERIOR CERVICAL DECOMP/DISCECTOMY FUSION N/A 12/21/2017   Procedure: C3-4 ANTERIOR CERVICAL DECOMPRESSION/DISCECTOMY FUSION, ALLOGRAFT, PLATE;  Surgeon: Marybelle Killings, MD;  Location: [facility];  Service: Orthopedics;  Laterality: N/A;  . COLONOSCOPY  2008   Dr. Oneida Alar: Hyperplastic polyp removed, diverticulosis.  Marland Kitchen FOOT SURGERY    . left elbow     . LUMBAR LAMINECTOMY     back in 2015    Prior to Admission medications   Medication Sig Start Date End Date Taking? Authorizing Provider  ALPRAZolam Duanne Moron) 0.25 MG tablet Take 0.25 mg by mouth daily as needed for anxiety.    Yes [provider]  atorvastatin (LIPITOR) 40 MG tablet Take 40 mg by mouth daily.   Yes [provider]  escitalopram (LEXAPRO) 20 MG tablet Take 20 mg by mouth daily.   Yes [provider]    Allergies as of 05/09/2018 - Review Complete 05/09/2018  Allergen Reaction Noted  . Codeine Other (See Comments)   . Meloxicam Itching 03/14/2013    Family History  Problem Relation Age of Onset  . Heart disease Mother   . Heart disease Father   . Other Cousin        colostomy  . Brain cancer Paternal Uncle   . Lung cancer Brother     Social History   Socioeconomic History  . Marital status: Divorced    Spouse name: Not on file  . Number of children: Not on file  . Years of  education: Not on file  . Highest education level: Not on file  Occupational History  . Not on file  Social Needs  . Financial resource strain: Not on file  . Food insecurity:    Worry: Not on file    Inability: Not on file  . Transportation needs:    Medical: Not on file    Non-medical: Not on file  Tobacco Use  . Smoking status: Former Smoker    Years: 12.00  . Smokeless tobacco: Never Used  Substance and Sexual Activity  . Alcohol use: No    Comment: recover alcolol 1985  . Drug use: No  . Sexual activity: Not on file    Comment: quit  1986  Lifestyle  . Physical activity:    Days per week: Not on file    Minutes per session: Not on file  . Stress: Not on file  Relationships  . Social connections:    Talks on phone: Not on file    Gets together: Not on file    Attends religious service: Not on file    Active member of club or organization: Not on file    Attends meetings of clubs or organizations: Not on file    Relationship status: Not on file  . Intimate partner violence:  Fear of current or ex partner: Not on file    Emotionally abused: Not on file    Physically abused: Not on file    Forced sexual activity: Not on file  Other Topics Concern  . Not on file  Social History Narrative  . Not on file    Review of Systems: See HPI, otherwise negative ROS   Physical Exam: BP 135/84   Pulse 69   Temp 98.5 F (36.9 C) (Oral)   Resp 18   Ht 5\' 10"  (1.778 m)   Wt 98.8 kg   SpO2 94%   BMI 31.25 kg/m  General:   Alert,  pleasant and cooperative in NAD Head:  Normocephalic and atraumatic. Neck:  Supple; Lungs:  Clear throughout to auscultation.    Heart:  Regular rate and rhythm. Abdomen:  Soft, nontender and nondistended. Normal bowel sounds, without guarding, and without rebound.   Neurologic:  Alert and  oriented x4;  grossly normal neurologically.  Impression/Plan:    SCREENING  Plan:  1. TCS TODAY DISCUSSED PROCEDURE, BENEFITS, & RISKS: < 1%  chance of medication reaction, bleeding, perforation, or rupture of spleen/liver.

## 2018-07-11 NOTE — Anesthesia Procedure Notes (Signed)
Procedure Name: MAC Date/Time: 07/11/2018 7:32 AM Performed by: Vista Deck, CRNA Pre-anesthesia Checklist: Patient identified, Emergency Drugs available, Suction available, Timeout performed and Patient being monitored Patient Re-evaluated:Patient Re-evaluated prior to induction Oxygen Delivery Method: Nasal Cannula

## 2018-07-11 NOTE — Discharge Instructions (Signed)
You have small internal hemorrhoids and LARGE EXTERNAL HEMORRHOIDS. YOU HAVE Diverticulosis IN YOUR LEFT COLON. YOU HAD THREE SMALL POLYPS REMOVED.    DRINK WATER TO KEEP YOUR URINE LIGHT YELLOW.  CONTINUE YOUR WEIGHT LOSS EFFORTS. YOUR BODY MASS INDEX IS OVER 30 WHICH MEANS YOU ARE OBESE. OBESITY IS ASSOCIATED WITH AN INCREASE FOR ALL CANCERS, INCLUDING ESOPHAGEAL AND COLON CANCER. A WEIGHT OF  205 LBS OR LESS  WILL GET YOUR BODY MASS INDEX(BMI) UNDER 30.  FOLLOW A HIGH FIBER DIET. AVOID ITEMS THAT CAUSE BLOATING. See info below.  YOUR BIOPSY RESULTS WILL BE BACK IN 5 BUSINESS DAYS.   USE PREPARATION H FOUR TIMES  A DAY IF NEEDED TO RELIEVE RECTAL PAIN/PRESSURE/BLEEDING.  Next colonoscopy in 3-10 years.  Colonoscopy Care After Read the instructions outlined below and refer to this sheet in the next week. These discharge instructions provide you with general information on caring for yourself after you leave the hospital. While your treatment has been planned according to the most current medical practices available, unavoidable complications occasionally occur. If you have any problems or questions after discharge, call DR. Lokelani Lutes, [phone].  ACTIVITY  You may resume your regular activity, but move at a slower pace for the next 24 hours.   Take frequent rest periods for the next 24 hours.   Walking will help get rid of the air and reduce the bloated feeling in your belly (abdomen).   No driving for 24 hours (because of the medicine (anesthesia) used during the test).   You may shower.   Do not sign any important legal documents or operate any machinery for 24 hours (because of the anesthesia used during the test).    NUTRITION  Drink plenty of fluids.   You may resume your normal diet as instructed by your doctor.   Begin with a light meal and progress to your normal diet. Heavy or fried foods are harder to digest and may make you feel sick to your stomach (nauseated).     Avoid alcoholic beverages for 24 hours or as instructed.    MEDICATIONS  You may resume your normal medications.   WHAT YOU CAN EXPECT TODAY  Some feelings of bloating in the abdomen.   Passage of more gas than usual.   Spotting of blood in your stool or on the toilet paper  .  IF YOU HAD POLYPS REMOVED DURING THE COLONOSCOPY:  Eat a soft diet IF YOU HAVE NAUSEA, BLOATING, ABDOMINAL PAIN, OR VOMITING.    FINDING OUT THE RESULTS OF YOUR TEST Not all test results are available during your visit. DR. Oneida Alar WILL CALL YOU WITHIN 14 DAYS OF YOUR PROCEDUE WITH YOUR RESULTS. Do not assume everything is normal if you have not heard from DR. Jean Alejos, CALL HER OFFICE AT [phone].  SEEK IMMEDIATE MEDICAL ATTENTION AND CALL THE OFFICE: [phone] IF:  You have more than a spotting of blood in your stool.   Your belly is swollen (abdominal distention).   You are nauseated or vomiting.   You have a temperature over 101F.   You have abdominal pain or discomfort that is severe or gets worse throughout the day.  High-Fiber Diet A high-fiber diet changes your normal diet to include more whole grains, legumes, fruits, and vegetables. Changes in the diet involve replacing refined carbohydrates with unrefined foods. The calorie level of the diet is essentially unchanged. The Dietary Reference Intake (recommended amount) for adult males is 38 grams per day. For adult females, it  is 25 grams per day. Pregnant and lactating women should consume 28 grams of fiber per day. Fiber is the intact part of a plant that is not broken down during digestion. Functional fiber is fiber that has been isolated from the plant to provide a beneficial effect in the body.  PURPOSE  Increase stool bulk.   Ease and regulate bowel movements.   Lower cholesterol.   REDUCE RISK OF COLON CANCER  INDICATIONS THAT YOU NEED MORE FIBER  Constipation and hemorrhoids.   Uncomplicated diverticulosis  (intestine condition) and irritable bowel syndrome.   Weight management.   As a protective measure against hardening of the arteries (atherosclerosis), diabetes, and cancer.   GUIDELINES FOR INCREASING FIBER IN THE DIET  Start adding fiber to the diet slowly. A gradual increase of about 5 more grams (2 slices of whole-wheat bread, 2 servings of most fruits or vegetables, or 1 bowl of high-fiber cereal) per day is best. Too rapid an increase in fiber may result in constipation, flatulence, and bloating.   Drink enough water and fluids to keep your urine clear or pale yellow. Water, juice, or caffeine-free drinks are recommended. Not drinking enough fluid may cause constipation.   Eat a variety of high-fiber foods rather than one type of fiber.   Try to increase your intake of fiber through using high-fiber foods rather than fiber pills or supplements that contain small amounts of fiber.   The goal is to change the types of food eaten. Do not supplement your present diet with high-fiber foods, but replace foods in your present diet.   INCLUDE A VARIETY OF FIBER SOURCES  Replace refined and processed grains with whole grains, canned fruits with fresh fruits, and incorporate other fiber sources. White rice, white breads, and most bakery goods contain little or no fiber.   Brown whole-grain rice, buckwheat oats, and many fruits and vegetables are all good sources of fiber. These include: broccoli, Brussels sprouts, cabbage, cauliflower, beets, sweet potatoes, white potatoes (skin on), carrots, tomatoes, eggplant, squash, berries, fresh fruits, and dried fruits.   Cereals appear to be the richest source of fiber. Cereal fiber is found in whole grains and bran. Bran is the fiber-rich outer coat of cereal grain, which is largely removed in refining. In whole-grain cereals, the bran remains. In breakfast cereals, the largest amount of fiber is found in those with "bran" in their names. The fiber  content is sometimes indicated on the label.   You may need to include additional fruits and vegetables each day.   In baking, for 1 cup white flour, you may use the following substitutions:   1 cup whole-wheat flour minus 2 tablespoons.   1/2 cup white flour plus 1/2 cup whole-wheat flour.   Polyps, Colon  A polyp is extra tissue that grows inside your body. Colon polyps grow in the large intestine. The large intestine, also called the colon, is part of your digestive system. It is a long, hollow tube at the end of your digestive tract where your body makes and stores stool. Most polyps are not dangerous. They are benign. This means they are not cancerous. But over time, some types of polyps can turn into cancer. Polyps that are smaller than a pea are usually not harmful. But larger polyps could someday become or may already be cancerous. To be safe, doctors remove all polyps and test them.   PREVENTION There is not one sure way to prevent polyps. You might be able to lower  your risk of getting them if you:  Eat more fruits and vegetables and less fatty food.   Do not smoke.   Avoid alcohol.   Exercise every day.   Lose weight if you are overweight.   Eating more calcium and folate can also lower your risk of getting polyps. Some foods that are rich in calcium are milk, cheese, and broccoli. Some foods that are rich in folate are chickpeas, kidney beans, and spinach.    Diverticulosis Diverticulosis is a common condition that develops when small pouches (diverticula) form in the wall of the colon. The risk of diverticulosis increases with age. It happens more often in people who eat a low-fiber diet. Most individuals with diverticulosis have no symptoms. Those individuals with symptoms usually experience belly (abdominal) pain, constipation, or loose stools (diarrhea).  HOME CARE INSTRUCTIONS  Increase the amount of fiber in your diet as directed by your caregiver or dietician.  This may reduce symptoms of diverticulosis.   Drink at least 6 to 8 glasses of water each day to prevent constipation.   Try not to strain when you have a bowel movement.   Avoiding nuts and seeds to prevent complications is NOT NECESSARY.   FOODS HAVING HIGH FIBER CONTENT INCLUDE:  Fruits. Apple, peach, pear, tangerine, raisins, prunes.   Vegetables. Brussels sprouts, asparagus, broccoli, cabbage, carrot, cauliflower, romaine lettuce, spinach, summer squash, tomato, winter squash, zucchini.   Starchy Vegetables. Baked beans, kidney beans, lima beans, split peas, lentils, potatoes (with skin).   Grains. Whole wheat bread, brown rice, bran flake cereal, plain oatmeal, white rice, shredded wheat, bran muffins.   SEEK IMMEDIATE MEDICAL CARE IF:  You develop increasing pain or severe bloating.   You have an oral temperature above 101F.   You develop vomiting or bowel movements that are bloody or black.

## 2018-07-11 NOTE — Anesthesia Postprocedure Evaluation (Signed)
Anesthesia Post Note  Patient: [name]  Procedure(s) Performed: COLONOSCOPY WITH PROPOFOL (N/A ) POLYPECTOMY  Patient location during evaluation: PACU Anesthesia Type: General Level of consciousness: awake and alert and patient cooperative Pain management: satisfactory to patient Vital Signs Assessment: post-procedure vital signs reviewed and stable Respiratory status: spontaneous breathing Cardiovascular status: stable Postop Assessment: no apparent nausea or vomiting Anesthetic complications: no     Last Vitals:  Vitals:   07/11/18 0816 07/11/18 0826  BP:  111/82  Pulse:  80  Resp:  (!) 21  Temp: 36.6 C   SpO2:  96%    Last Pain:  Vitals:   07/11/18 0816  TempSrc:   PainSc: 0-No pain                 [name]

## 2018-07-11 NOTE — Anesthesia Preprocedure Evaluation (Signed)
Anesthesia Evaluation  Patient identified by MRN, date of birth, ID band Patient awake    Reviewed: Allergy & Precautions, NPO status , Patient's Chart, lab work & pertinent test results  History of Anesthesia Complications (+) history of anesthetic complications  Airway Mallampati: II  TM Distance: >3 FB Neck ROM: Full    Dental no notable dental hx. (+) Edentulous Upper, Edentulous Lower   Pulmonary sleep apnea , former smoker,  Denies OSA after weight loss   Pulmonary exam normal breath sounds clear to auscultation       Cardiovascular Exercise Tolerance: Good negative cardio ROS Normal cardiovascular examI Rhythm:Regular Rate:Normal     Neuro/Psych Anxiety negative neurological ROS  negative psych ROS   GI/Hepatic Neg liver ROS, GERD  Medicated and Controlled,  Endo/Other  negative endocrine ROS  Renal/GU negative Renal ROS  negative genitourinary   Musculoskeletal  (+) Arthritis , Osteoarthritis,    Abdominal   Peds negative pediatric ROS (+)  Hematology negative hematology ROS (+)   Anesthesia Other Findings   Reproductive/Obstetrics negative OB ROS                             Anesthesia Physical Anesthesia Plan  ASA: II  Anesthesia Plan: General   Post-op Pain Management:    Induction: Intravenous  PONV Risk Score and Plan:   Airway Management Planned: Nasal Cannula and Simple Face Mask  Additional Equipment:   Intra-op Plan:   Post-operative Plan:   Informed Consent: I have reviewed the patients History and Physical, chart, labs and discussed the procedure including the risks, benefits and alternatives for the proposed anesthesia with the patient or authorized representative who has indicated his/her understanding and acceptance.   Dental advisory given  Plan Discussed with: CRNA  Anesthesia Plan Comments:         Anesthesia Quick Evaluation

## 2018-07-14 ENCOUNTER — Telehealth: Payer: Self-pay | Admitting: Gastroenterology

## 2018-07-14 NOTE — Telephone Encounter (Signed)
  Please call pt. He had ONE HYPERPLASTIC POLYP AND TWO POLYPOID LESIONS REMOVED.   DRINK WATER TO KEEP YOUR URINE LIGHT YELLOW.  CONTINUE YOUR WEIGHT LOSS EFFORTS. A WEIGHT OF  205 LBS OR LESS  WILL GET YOUR BODY MASS INDEX(BMI) UNDER 30.  FOLLOW A HIGH FIBER DIET. AVOID ITEMS THAT CAUSE BLOATING.   USE PREPARATION H FOUR TIMES  A DAY IF NEEDED TO RELIEVE RECTAL PAIN/PRESSURE/BLEEDING.  Next colonoscopy in 10 years.

## 2018-07-14 NOTE — Telephone Encounter (Signed)
Pt notified of results

## 2018-07-17 NOTE — Telephone Encounter (Signed)
ON RECALL  °

## 2018-07-18 ENCOUNTER — Encounter (HOSPITAL_COMMUNITY): Payer: Self-pay | Admitting: Gastroenterology

## 2019-07-04 ENCOUNTER — Other Ambulatory Visit: Payer: Self-pay

## 2019-07-04 ENCOUNTER — Ambulatory Visit: Payer: BLUE CROSS/BLUE SHIELD | Attending: Internal Medicine

## 2019-07-04 DIAGNOSIS — Z20822 Contact with and (suspected) exposure to covid-19: Secondary | ICD-10-CM

## 2019-07-04 DIAGNOSIS — U071 COVID-19: Secondary | ICD-10-CM | POA: Insufficient documentation

## 2019-07-05 ENCOUNTER — Telehealth: Payer: Self-pay

## 2019-07-05 LAB — NOVEL CORONAVIRUS, NAA: SARS-CoV-2, NAA: DETECTED — AB

## 2019-07-05 NOTE — Progress Notes (Signed)
Order(s) created erroneously. Erroneous order ID: [id number]  Order moved by: Brigitte Pulse  Order move date/time: 07/05/2019 1:05 PM  Source Patient: [id number]  Source Contact: 07/04/2019  Destination Patient: [id number]  Destination Contact: 07/04/2019

## 2019-07-05 NOTE — Telephone Encounter (Signed)
Pt. Checking on COVID 19 results, not available yet. °

## 2019-07-05 NOTE — Progress Notes (Signed)
Moving orders to this encounter.  

## 2019-07-06 ENCOUNTER — Telehealth: Payer: Self-pay | Admitting: Nurse Practitioner

## 2019-07-06 NOTE — Telephone Encounter (Signed)
Called to Discuss with patient about Covid symptoms and the use of bamlanivimab, a monoclonal antibody infusion for those with mild to moderate Covid symptoms and at a high risk of hospitalization.     Patient does not meet criteria for infusion.

## 2019-07-08 ENCOUNTER — Emergency Department (HOSPITAL_COMMUNITY)
Admission: EM | Admit: 2019-07-08 | Discharge: 2019-07-08 | Disposition: A | Discharge status: Home / Self Care | Payer: BLUE CROSS/BLUE SHIELD | Attending: Emergency Medicine | Admitting: Emergency Medicine

## 2019-07-08 ENCOUNTER — Emergency Department (HOSPITAL_COMMUNITY): Payer: BLUE CROSS/BLUE SHIELD

## 2019-07-08 ENCOUNTER — Encounter (HOSPITAL_COMMUNITY): Payer: Self-pay

## 2019-07-08 ENCOUNTER — Other Ambulatory Visit: Payer: Self-pay

## 2019-07-08 DIAGNOSIS — U071 COVID-19: Secondary | ICD-10-CM | POA: Diagnosis not present

## 2019-07-08 DIAGNOSIS — Z87891 Personal history of nicotine dependence: Secondary | ICD-10-CM | POA: Insufficient documentation

## 2019-07-08 DIAGNOSIS — J1289 Other viral pneumonia: Secondary | ICD-10-CM | POA: Diagnosis not present

## 2019-07-08 DIAGNOSIS — Z79899 Other long term (current) drug therapy: Secondary | ICD-10-CM | POA: Insufficient documentation

## 2019-07-08 LAB — CBC WITH DIFFERENTIAL/PLATELET
Abs Immature Granulocytes: 0.04 10*3/uL (ref 0.00–0.07)
Basophils Absolute: 0 10*3/uL (ref 0.0–0.1)
Basophils Relative: 0 %
Eosinophils Absolute: 0 10*3/uL (ref 0.0–0.5)
Eosinophils Relative: 0 %
HCT: 38.5 % — ABNORMAL LOW (ref 39.0–52.0)
Hemoglobin: 12.7 g/dL — ABNORMAL LOW (ref 13.0–17.0)
Immature Granulocytes: 0 %
Lymphocytes Relative: 13 %
Lymphs Abs: 1.3 10*3/uL (ref 0.7–4.0)
MCH: 28 pg (ref 26.0–34.0)
MCHC: 33 g/dL (ref 30.0–36.0)
MCV: 84.8 fL (ref 80.0–100.0)
Monocytes Absolute: 0.5 10*3/uL (ref 0.1–1.0)
Monocytes Relative: 4 %
Neutro Abs: 8.6 10*3/uL — ABNORMAL HIGH (ref 1.7–7.7)
Neutrophils Relative %: 83 %
Platelets: 293 10*3/uL (ref 150–400)
RBC: 4.54 MIL/uL (ref 4.22–5.81)
RDW: 14.4 % (ref 11.5–15.5)
WBC: 10.5 10*3/uL (ref 4.0–10.5)
nRBC: 0 % (ref 0.0–0.2)

## 2019-07-08 LAB — BASIC METABOLIC PANEL
Anion gap: 10 (ref 5–15)
BUN: 20 mg/dL (ref 8–23)
CO2: 25 mmol/L (ref 22–32)
Calcium: 8.5 mg/dL — ABNORMAL LOW (ref 8.9–10.3)
Chloride: 100 mmol/L (ref 98–111)
Creatinine, Ser: 1.08 mg/dL (ref 0.61–1.24)
GFR calc Af Amer: >60 mL/min (ref >60)
GFR calc non Af Amer: >60 mL/min (ref >60)
Glucose, Bld: 114 mg/dL — ABNORMAL HIGH (ref 70–99)
Potassium: 4.1 mmol/L (ref 3.5–5.1)
Sodium: 135 mmol/L (ref 135–145)

## 2019-07-08 MED ORDER — PREDNISONE 10 MG PO TABS: 40.0000 mg | ORAL_TABLET | Freq: Every day | ORAL | 0 refills | Status: DC

## 2019-07-08 MED ORDER — PREDNISONE 50 MG PO TABS
60.0000 mg | ORAL_TABLET | Freq: Once | ORAL | Status: AC
  Administered 2019-07-08: 60 mg via ORAL
  Filled 2019-07-08: qty 1

## 2019-07-08 MED ORDER — ZINC GLUCONATE 50 MG PO TABS: 50.0000 mg | ORAL_TABLET | Freq: Every day | ORAL | 0 refills | Status: AC

## 2019-07-08 NOTE — ED Triage Notes (Signed)
Pt reports positive for covid.  Has had sob, fever, cough, body aches since last Friday.  Reports has had 2 syncopal episodes and has been having hallucinations.

## 2019-07-08 NOTE — Discharge Instructions (Addendum)
Chest x-ray showing signs of Covid pneumonia.  Take the prednisone as directed for the next 5 days.  Recommend he start taking zinc daily along with over-the-counter vitamin D supplement and a multivitamin.  Oxygen levels have been fine today but things could change over the next few days.  Return for any new or worse symptoms.

## 2019-07-08 NOTE — ED Provider Notes (Signed)
[facility] Provider Note   CSN: [id number] Arrival date & time: 07/08/19  [id number]     History Chief Complaint  Patient presents with  . [name] is a 63 y.o. male.  Patient diagnosed with COVID-19 infection on the 16th but has been symptomatic since the 11th.  Just recently started to have trouble with increased shortness of breath.  He has had persistent symptoms of fever cough and body aches.  Triage noted that patient has had 2 syncopal episodes.  Me talking with the patient the patient had a near syncopal episode.  Patient does live by himself.  Reports having some hallucinations.  No suicidal thoughts.  No distinct auditory or visual hallucinations.        Past Medical History:  Diagnosis Date  . Anxiety    some panic attacks- off medications  . Arthritis   . Complication of anesthesia    woke up during surgery- Elbow surgery, 2009 and again with foot surgeryry   . GERD (gastroesophageal reflux disease)    once in awhile  . Hypercholesteremia   . Sleep apnea    has lost 'abunch of weight' its been yrs since i used it    Patient Active Problem List   Diagnosis Date Noted  . Encounter for screening colonoscopy 05/09/2018  . Polyphagia 05/09/2018  . Complication of anesthesia 05/09/2018  . Superior labrum anterior-to-posterior (SLAP) tear of right shoulder 03/29/2018  . History of fusion of cervical spine 12/28/2017  . Status post lumbar spinal fusion 11/15/2017  . RESTLESS LEGS SYNDROME 08/02/2009  . HYPERLIPIDEMIA 07/23/2009  . OBSTRUCTIVE SLEEP APNEA 07/23/2009    Past Surgical History:  Procedure Laterality Date  . ANTERIOR CERVICAL DECOMP/DISCECTOMY FUSION N/A 12/21/2017   Procedure: C3-4 ANTERIOR CERVICAL DECOMPRESSION/DISCECTOMY FUSION, ALLOGRAFT, PLATE;  Surgeon: Marybelle Killings, MD;  Location: [place];  Service: Orthopedics;  Laterality: N/A;  . COLONOSCOPY  2008   Dr. Oneida Alar: Hyperplastic polyp removed, diverticulosis.    . COLONOSCOPY WITH PROPOFOL N/A 07/11/2018   Procedure: COLONOSCOPY WITH PROPOFOL;  Surgeon: Danie Binder, MD;  [facility];  Service: Endoscopy;  Laterality: N/A;  7:30am  . FOOT SURGERY    . left elbow     . LUMBAR LAMINECTOMY     back in 2015  . POLYPECTOMY  07/11/2018   Procedure: POLYPECTOMY;  Surgeon: Danie Binder, MD;  Location: [facility];  Service: Endoscopy;;  colon       Family History  Problem Relation Age of Onset  . Heart disease Mother   . Heart disease Father   . Other Cousin        colostomy  . Brain cancer Paternal Uncle   . Lung cancer Brother     Social History   Tobacco Use  . Smoking status: Former Smoker    Years: 12.00  . Smokeless tobacco: Never Used  Substance Use Topics  . Alcohol use: No    Comment: recover alcolol 1985  . Drug use: No    Home Medications Prior to Admission medications   Medication Sig Start Date End Date Taking? Authorizing Provider  ALPRAZolam Duanne Moron) 0.25 MG tablet Take 0.25 mg by mouth daily as needed for anxiety.    Yes [provider]  atorvastatin (LIPITOR) 40 MG tablet Take 40 mg by mouth daily.   Yes [provider]  escitalopram (LEXAPRO) 20 MG tablet Take 20 mg by mouth daily.   Yes [provider]  indomethacin (INDOCIN) 50 MG capsule Take 50 mg by mouth 3 (three) times daily. 06/22/19  Yes [provider]  predniSONE (DELTASONE) 10 MG tablet 10 mg See admin instructions. Take 4 tablets daily for 2 days, 2 x 2 days, 2 x 2 days, 1 x 2 days, 1/2 x 2 days 05/21/19   [provider]  predniSONE (DELTASONE) 10 MG tablet Take 4 tablets (40 mg total) by mouth daily. 07/08/19   [name], MD  zinc gluconate 50 MG tablet Take 1 tablet (50 mg total) by mouth daily for 7 days. 07/08/19 07/15/19  [name], MD    Allergies    Codeine and Meloxicam  Review of Systems   Review of Systems  Constitutional: Positive for fever. Negative for chills.   HENT: Positive for congestion. Negative for rhinorrhea and sore throat.   Eyes: Negative for visual disturbance.  Respiratory: Positive for cough and shortness of breath.   Cardiovascular: Negative for chest pain and leg swelling.  Gastrointestinal: Negative for abdominal pain, diarrhea, nausea and vomiting.  Genitourinary: Negative for dysuria.  Musculoskeletal: Positive for myalgias. Negative for back pain and neck pain.  Skin: Negative for rash.  Neurological: Negative for dizziness, syncope, speech difficulty, weakness, light-headedness, numbness and headaches.  Hematological: Does not bruise/bleed easily.  Psychiatric/Behavioral: Positive for hallucinations. Negative for confusion.    Physical Exam Updated Vital Signs BP 101/62   Pulse 75   Temp 98.5 F (36.9 C) (Oral)   Resp 14   Ht 1.778 m (5\' 10" )   Wt 95.3 kg   SpO2 94%   BMI 30.13 kg/m   Physical Exam Vitals and nursing note reviewed.  Constitutional:      General: He is not in acute distress.    Appearance: Normal appearance. He is well-developed.  HENT:     Head: Normocephalic and atraumatic.  Eyes:     Extraocular Movements: Extraocular movements intact.     Conjunctiva/sclera: Conjunctivae normal.     Pupils: Pupils are equal, round, and reactive to light.  Cardiovascular:     Rate and Rhythm: Normal rate and regular rhythm.     Heart sounds: No murmur.  Pulmonary:     Effort: Pulmonary effort is normal. No respiratory distress.     Breath sounds: Normal breath sounds.  Abdominal:     Palpations: Abdomen is soft.     Tenderness: There is no abdominal tenderness.  Musculoskeletal:        General: No swelling. Normal range of motion.     Cervical back: Normal range of motion and neck supple.  Skin:    General: Skin is warm and dry.     Capillary Refill: Capillary refill takes less than 2 seconds.  Neurological:     General: No focal deficit present.     Mental Status: He is alert and oriented to  person, place, and time.     Cranial Nerves: No cranial nerve deficit.     Sensory: No sensory deficit.     Motor: No weakness.     ED Results / Procedures / Treatments   Labs (all labs ordered are listed, but only abnormal results are displayed) Labs Reviewed  CBC WITH DIFFERENTIAL/PLATELET - Abnormal; Notable for the following components:      Result Value   Hemoglobin 12.7 (*)    HCT 38.5 (*)    Neutro Abs 8.6 (*)    All other components within normal limits  BASIC METABOLIC PANEL - Abnormal; Notable  for the following components:   Glucose, Bld 114 (*)    Calcium 8.5 (*)    All other components within normal limits    EKG EKG Interpretation  Date/Time:  "Sunday July 08 2019 08:05:02 EST Ventricular Rate:  77 PR Interval:    QRS Duration: 90 QT Interval:  381 QTC Calculation: 432 R Axis:   58 Text Interpretation: Sinus rhythm Probable left atrial enlargement Baseline wander in lead(s) V4 Confirmed by Zala Degrasse ([id number]) on 07/08/2019 8:08:16 AM   Radiology DG Chest Port 1 View  Result Date: 07/08/2019 CLINICAL DATA:  COVID positive EXAM: PORTABLE CHEST 1 VIEW COMPARISON:  10/05/2017 FINDINGS: Cardiomediastinal contours are stable. Patchy bilateral ill-defined nodular opacities with peripheral and basilar predominance. No signs of pleural effusion. No acute bone finding. IMPRESSION: Findings compatible with COVID-19 infection as provided in the history. Electronically Signed   By: Geoffrey  Wile M.D.   On: 07/08/2019 09:52    Procedures Procedures (including critical care time)  Medications Ordered in ED Medications  predniSONE (DELTASONE) tablet 60 mg (has no administration in time range)    ED Course  I have reviewed the triage vital signs and the nursing notes.  Pertinent labs & imaging results that were available during my care of the patient were reviewed by me and considered in my medical decision making (see chart for details).    MDM  Rules/Calculators/A&P                      Patient's oxygen saturations here have been 94% or better.  Not particularly tachypneic or tachycardic.  No hypotension.  Chest x-ray is showing signs of multifocal pneumonia secondary to COVID-19 infection.  Patient in no acute distress.  Patient's labs without any significant abnormalities.  EKG without any acute changes.  Cardiac monitoring without any arrhythmias.  Patient at this time not meet criteria for admission.  Will need to be monitored closely.  We will start patient on prednisone for the chest x-ray changes and start him on zinc and vitamin D supplement.  Patient given precautions and will return for any new or worse symptoms secondary to the COVID-19 infection.  He will continue to self isolate.    Final Clinical Impression(s) / ED Diagnoses Final diagnoses:  Pneumonia due to COVID-19 virus    Rx / DC Orders ED Discharge Orders         Ordered    predniSONE (DELTASONE) 10 MG tablet  Daily     07/08/19 1002    zinc gluconate 50 MG tablet  Daily     12" /20/20 1002           [name], MD 07/08/19 1011

## 2019-08-03 IMAGING — RF DG C-ARM 61-120 MIN
1 series · 3 of 3 positions shown · non-contrast
Comparison: 10/05/2017

FLUOROSCOPY TIME:  0 minutes 12 seconds

Images submitted: 3

CLINICAL DATA: C3-C4 ACDF

EXAM:
DG C-ARM 61-120 MIN

[Series 1: run · 3 of 3 slices shown]
[im 1/3]
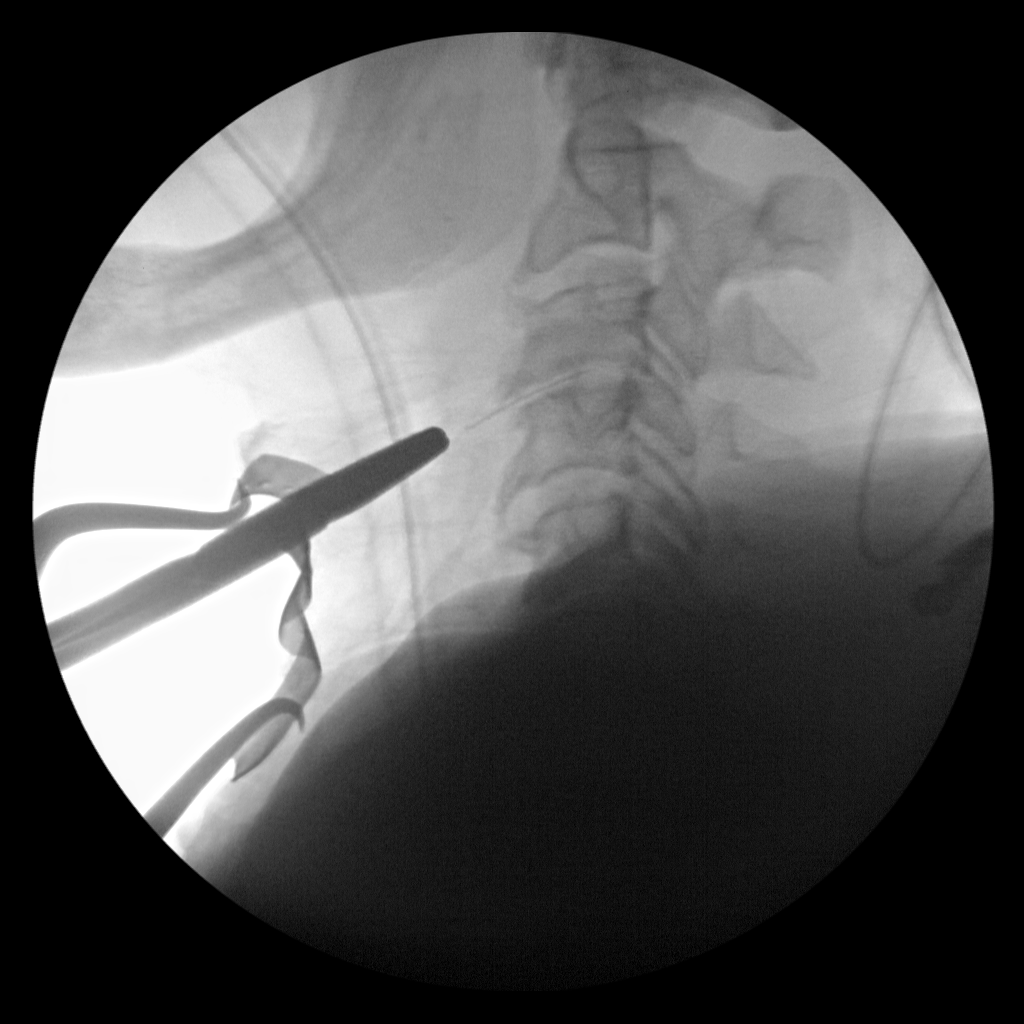
[im 2/3]
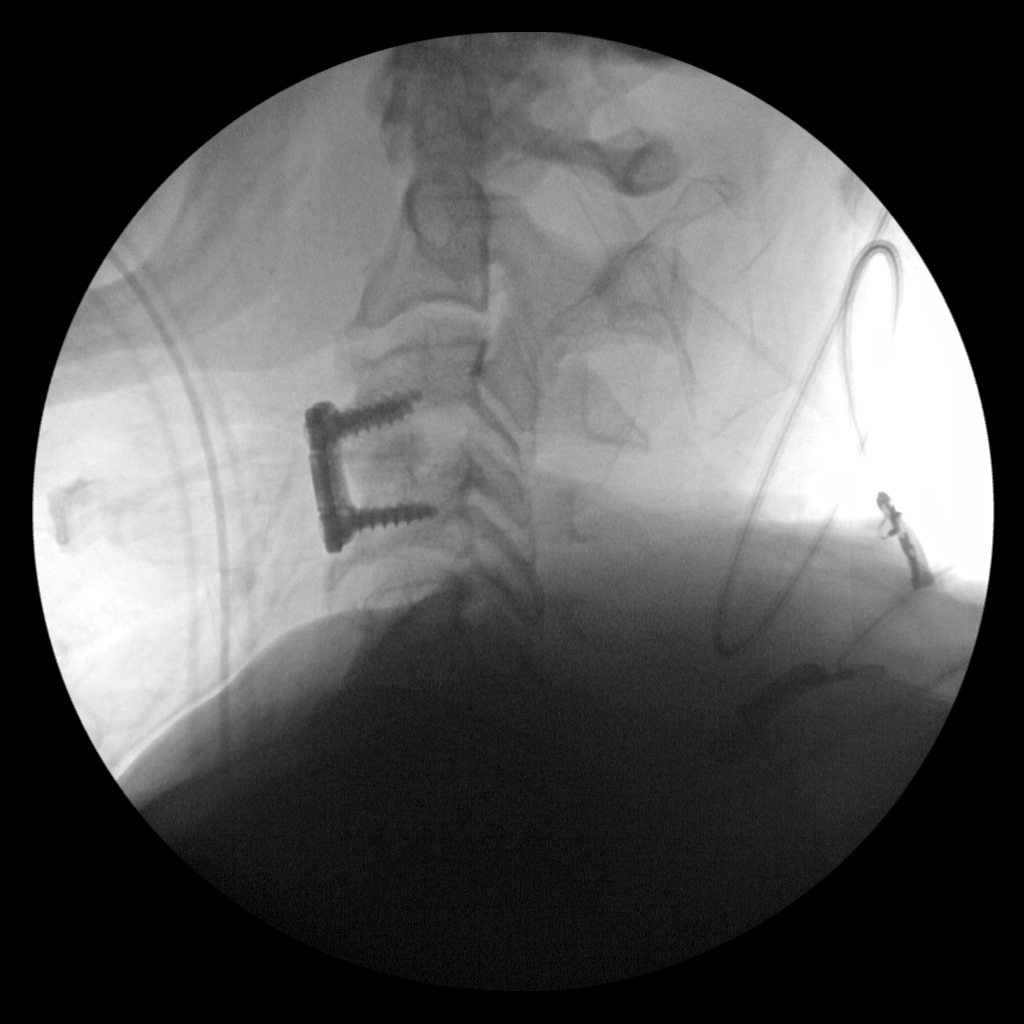
[im 3/3]
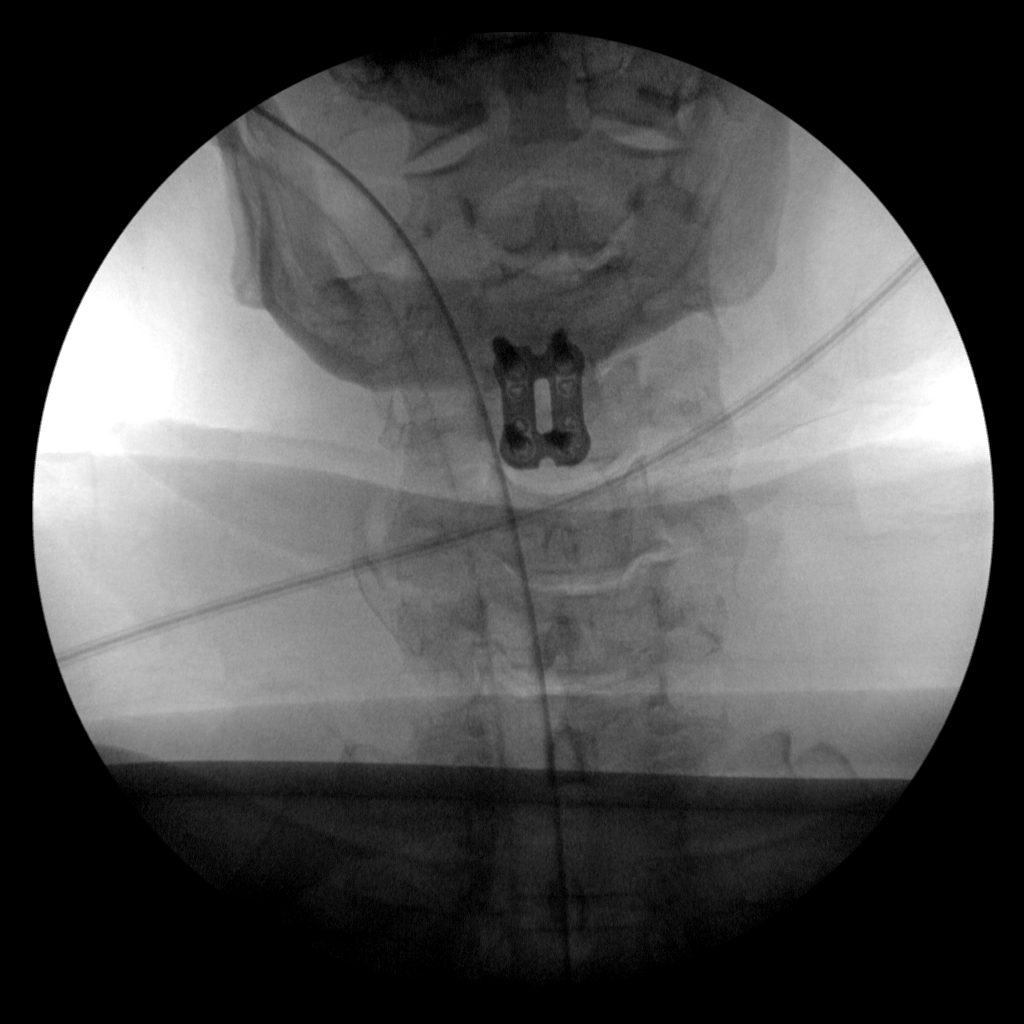

[3 of 3 positions shown; findings below may reference images not displayed]

FINDINGS: Initial image at 9940 hours reveals an anterior thin metallic
density projecting over the C3-C4 disc space. Endplate spur
formation noted at C4-C5.

Two additional images obtained at 4842 hours and 0307 hours reveal
placement of an anterior plate and screws at C3-C4. No subluxation.
IMPRESSION: Anterior fusion and plating of C3-C4.

## 2019-08-13 ENCOUNTER — Other Ambulatory Visit (HOSPITAL_COMMUNITY): Payer: BLUE CROSS/BLUE SHIELD

## 2019-08-29 ENCOUNTER — Other Ambulatory Visit: Payer: Self-pay | Admitting: Podiatry

## 2019-09-07 NOTE — Patient Instructions (Signed)
[name]  09/07/2019     @PREFPERIOPPHARMACY @   Your procedure is scheduled on  09/12/2019.  Report to [facility] at  [facility].M.  Call this number if you have problems the morning of surgery:  [phone]   Remember:  Do not eat or drink after midnight.                      Take these medicines the morning of surgery with A SIP OF WATER xanax (if needed), lexapro, indocin.    Do not wear jewelry, make-up or nail polish.  Do not wear lotions, powders, or perfumes. Please wear deodorant and brush your teeth.  Do not shave 48 hours prior to surgery.  Men may shave face and neck.  Do not bring valuables to the hospital.  [facility] is not responsible for any belongings or valuables.  Contacts, dentures or bridgework may not be worn into surgery.  Leave your suitcase in the car.  After surgery it may be brought to your room.  For patients admitted to the hospital, discharge time will be determined by your treatment team.  Patients discharged the day of surgery will not be allowed to drive home.   Name and phone number of your driver:   family Special instructions:  DO NOT smoke the morning of your surgery.  Please read over the following fact sheets that you were given. Anesthesia Post-op Instructions and Care and Recovery After Surgery       Incision Care, Adult An incision is a cut that a doctor makes in your skin for surgery (for a procedure). Most times, these cuts are closed after surgery. Your cut from surgery may be closed with stitches (sutures), staples, skin glue, or skin tape (adhesive strips). You may need to return to your doctor to have stitches or staples taken out. This may happen many days or many weeks after your surgery. The cut needs to be well cared for so it does not get infected. How to care for your cut Cut care   Follow instructions from your doctor about how to take care of your cut. Make sure you: ? Wash your hands with soap and  water before you change your bandage (dressing). If you cannot use soap and water, use hand sanitizer. ? Change your bandage as told by your doctor. ? Leave stitches, skin glue, or skin tape in place. They may need to stay in place for 2 weeks or longer. If tape strips get loose and curl up, you may trim the loose edges. Do not remove tape strips completely unless your doctor says it is okay.  Check your cut area every day for signs of infection. Check for: ? More redness, swelling, or pain. ? More fluid or blood. ? Warmth. ? Pus or a bad smell.  Ask your doctor how to clean the cut. This may include: ? Using mild soap and water. ? Using a clean towel to pat the cut dry after you clean it. ? Putting a cream or ointment on the cut. Do this only as told by your doctor. ? Covering the cut with a clean bandage.  Ask your doctor when you can leave the cut uncovered.  Do not take baths, swim, or use a hot tub until your doctor says it is okay. Ask your doctor if you can take showers. You may only be allowed to take sponge baths for bathing.  Medicines  If you were prescribed an antibiotic medicine, cream, or ointment, take the antibiotic or put it on the cut as told by your doctor. Do not stop taking or putting on the antibiotic even if your condition gets better.  Take over-the-counter and prescription medicines only as told by your doctor. General instructions  Limit movement around your cut. This helps healing. ? Avoid straining, lifting, or exercise for the first month, or for as long as told by your doctor. ? Follow instructions from your doctor about going back to your normal activities. ? Ask your doctor what activities are safe.  Protect your cut from the sun when you are outside for the first 6 months, or for as long as told by your doctor. Put on sunscreen around the scar or cover up the scar.  Keep all follow-up visits as told by your doctor. This is important. Contact a doctor  if:  Your have more redness, swelling, or pain around the cut.  You have more fluid or blood coming from the cut.  Your cut feels warm to the touch.  You have pus or a bad smell coming from the cut.  You have a fever or shaking chills.  You feel sick to your stomach (nauseous) or you throw up (vomit).  You are dizzy.  Your stitches or staples come undone. Get help right away if:  You have a red streak coming from your cut.  Your cut bleeds through the bandage and the bleeding does not stop with gentle pressure.  The edges of your cut open up and separate.  You have very bad (severe) pain.  You have a rash.  You are confused.  You pass out (faint).  You have trouble breathing and you have a fast heartbeat. This information is not intended to replace advice given to you by your health care provider. Make sure you discuss any questions you have with your health care provider. Document Revised: 11/22/2016 Document Reviewed: 03/12/2016 Elsevier Patient Education  [place]. How to Use Chlorhexidine for Bathing Chlorhexidine gluconate (CHG) is a germ-killing (antiseptic) solution that is used to clean the skin. It can get rid of the bacteria that normally live on the skin and can keep them away for about 24 hours. To clean your skin with CHG, you may be given:  A CHG solution to use in the shower or as part of a sponge bath.  A prepackaged cloth that contains CHG. Cleaning your skin with CHG may help lower the risk for infection:  While you are staying in the intensive care unit of the hospital.  If you have a vascular access, such as a central line, to provide short-term or long-term access to your veins.  If you have a catheter to drain urine from your bladder.  If you are on a ventilator. A ventilator is a machine that helps you breathe by moving air in and out of your lungs.  After surgery. What are the risks? Risks of using CHG include:  A skin  reaction.  Hearing loss, if CHG gets in your ears.  Eye injury, if CHG gets in your eyes and is not rinsed out.  The CHG product catching fire. Make sure that you avoid smoking and flames after applying CHG to your skin. Do not use CHG:  If you have a chlorhexidine allergy or have previously reacted to chlorhexidine.  On babies younger than 33 months of age. How to use CHG solution  Use CHG  only as told by your health care provider, and follow the instructions on the label.  Use the full amount of CHG as directed. Usually, this is one bottle. During a shower Follow these steps when using CHG solution during a shower (unless your health care provider gives you different instructions): 1. Start the shower. 2. Use your normal soap and shampoo to wash your face and hair. 3. Turn off the shower or move out of the shower stream. 4. Pour the CHG onto a clean washcloth. Do not use any type of brush or rough-edged sponge. 5. Starting at your neck, lather your body down to your toes. Make sure you follow these instructions: ? If you will be having surgery, pay special attention to the part of your body where you will be having surgery. Scrub this area for at least 1 minute. ? Do not use CHG on your head or face. If the solution gets into your ears or eyes, rinse them well with water. ? Avoid your genital area. ? Avoid any areas of skin that have broken skin, cuts, or scrapes. ? Scrub your back and under your arms. Make sure to wash skin folds. 6. Let the lather sit on your skin for 1-2 minutes or as long as told by your health care provider. 7. Thoroughly rinse your entire body in the shower. Make sure that all body creases and crevices are rinsed well. 8. Dry off with a clean towel. Do not put any substances on your body afterward-such as powder, lotion, or perfume-unless you are told to do so by your health care provider. Only use lotions that are recommended by the manufacturer. 9. Put on  clean clothes or pajamas. 10. If it is the night before your surgery, sleep in clean sheets.  During a sponge bath Follow these steps when using CHG solution during a sponge bath (unless your health care provider gives you different instructions): 1. Use your normal soap and shampoo to wash your face and hair. 2. Pour the CHG onto a clean washcloth. 3. Starting at your neck, lather your body down to your toes. Make sure you follow these instructions: ? If you will be having surgery, pay special attention to the part of your body where you will be having surgery. Scrub this area for at least 1 minute. ? Do not use CHG on your head or face. If the solution gets into your ears or eyes, rinse them well with water. ? Avoid your genital area. ? Avoid any areas of skin that have broken skin, cuts, or scrapes. ? Scrub your back and under your arms. Make sure to wash skin folds. 4. Let the lather sit on your skin for 1-2 minutes or as long as told by your health care provider. 5. Using a different clean, wet washcloth, thoroughly rinse your entire body. Make sure that all body creases and crevices are rinsed well. 6. Dry off with a clean towel. Do not put any substances on your body afterward-such as powder, lotion, or perfume-unless you are told to do so by your health care provider. Only use lotions that are recommended by the manufacturer. 7. Put on clean clothes or pajamas. 8. If it is the night before your surgery, sleep in clean sheets. How to use CHG prepackaged cloths  Only use CHG cloths as told by your health care provider, and follow the instructions on the label.  Use the CHG cloth on clean, dry skin.  Do not use the CHG  cloth on your head or face unless your health care provider tells you to.  When washing with the CHG cloth: ? Avoid your genital area. ? Avoid any areas of skin that have broken skin, cuts, or scrapes. Before surgery Follow these steps when using a CHG cloth to clean  before surgery (unless your health care provider gives you different instructions): 1. Using the CHG cloth, vigorously scrub the part of your body where you will be having surgery. Scrub using a back-and-forth motion for 3 minutes. The area on your body should be completely wet with CHG when you are done scrubbing. 2. Do not rinse. Discard the cloth and let the area air-dry. Do not put any substances on the area afterward, such as powder, lotion, or perfume. 3. Put on clean clothes or pajamas. 4. If it is the night before your surgery, sleep in clean sheets.  For general bathing Follow these steps when using CHG cloths for general bathing (unless your health care provider gives you different instructions). 1. Use a separate CHG cloth for each area of your body. Make sure you wash between any folds of skin and between your fingers and toes. Wash your body in the following order, switching to a new cloth after each step: ? The front of your neck, shoulders, and chest. ? Both of your arms, under your arms, and your hands. ? Your stomach and groin area, avoiding the genitals. ? Your right leg and foot. ? Your left leg and foot. ? The back of your neck, your back, and your buttocks. 2. Do not rinse. Discard the cloth and let the area air-dry. Do not put any substances on your body afterward-such as powder, lotion, or perfume-unless you are told to do so by your health care provider. Only use lotions that are recommended by the manufacturer. 3. Put on clean clothes or pajamas. Contact a health care provider if:  Your skin gets irritated after scrubbing.  You have questions about using your solution or cloth. Get help right away if:  Your eyes become very red or swollen.  Your eyes itch badly.  Your skin itches badly and is red or swollen.  Your hearing changes.  You have trouble seeing.  You have swelling or tingling in your mouth or throat.  You have trouble breathing.  You swallow  any chlorhexidine. Summary  Chlorhexidine gluconate (CHG) is a germ-killing (antiseptic) solution that is used to clean the skin. Cleaning your skin with CHG may help to lower your risk for infection.  You may be given CHG to use for bathing. It may be in a bottle or in a prepackaged cloth to use on your skin. Carefully follow your health care provider's instructions and the instructions on the product label.  Do not use CHG if you have a chlorhexidine allergy.  Contact your health care provider if your skin gets irritated after scrubbing. This information is not intended to replace advice given to you by your health care provider. Make sure you discuss any questions you have with your health care provider. Document Revised: 09/21/2018 Document Reviewed: 06/02/2017 Elsevier Patient Education  [place].

## 2019-09-10 ENCOUNTER — Encounter (HOSPITAL_COMMUNITY): Payer: Self-pay

## 2019-09-10 ENCOUNTER — Other Ambulatory Visit: Payer: Self-pay

## 2019-09-10 ENCOUNTER — Other Ambulatory Visit (HOSPITAL_COMMUNITY)
Admission: RE | Admit: 2019-09-10 | Discharge: 2019-09-10 | Disposition: A | Discharge status: Home / Self Care | Payer: BLUE CROSS/BLUE SHIELD | Source: Ambulatory Visit | Attending: Podiatry | Admitting: Podiatry

## 2019-09-10 ENCOUNTER — Encounter (HOSPITAL_COMMUNITY)
Admission: RE | Admit: 2019-09-10 | Discharge: 2019-09-10 | Disposition: A | Discharge status: Home / Self Care | Payer: BLUE CROSS/BLUE SHIELD | Source: Ambulatory Visit | Attending: Podiatry | Admitting: Podiatry

## 2019-09-10 DIAGNOSIS — Z01812 Encounter for preprocedural laboratory examination: Secondary | ICD-10-CM | POA: Insufficient documentation

## 2019-09-10 DIAGNOSIS — Z20822 Contact with and (suspected) exposure to covid-19: Secondary | ICD-10-CM | POA: Diagnosis not present

## 2019-09-10 HISTORY — DX: Depression, unspecified: F32.A

## 2019-09-10 LAB — SARS CORONAVIRUS 2 (TAT 6-24 HRS): SARS Coronavirus 2: NEGATIVE

## 2019-09-12 ENCOUNTER — Encounter (HOSPITAL_COMMUNITY): Payer: Self-pay | Admitting: Podiatry

## 2019-09-12 ENCOUNTER — Other Ambulatory Visit: Payer: Self-pay

## 2019-09-12 ENCOUNTER — Encounter (HOSPITAL_COMMUNITY)
Admission: RE | Disposition: A | Discharge status: Home / Self Care | Payer: Self-pay | Source: Home / Self Care | Attending: Podiatry

## 2019-09-12 ENCOUNTER — Ambulatory Visit (HOSPITAL_COMMUNITY): Payer: BLUE CROSS/BLUE SHIELD | Admitting: Anesthesiology

## 2019-09-12 ENCOUNTER — Ambulatory Visit (HOSPITAL_COMMUNITY)
Admission: RE | Admit: 2019-09-12 | Discharge: 2019-09-12 | Disposition: A | Discharge status: Home / Self Care | Payer: BLUE CROSS/BLUE SHIELD | Attending: Podiatry | Admitting: Podiatry

## 2019-09-12 DIAGNOSIS — M1A0721 Idiopathic chronic gout, left ankle and foot, with tophus (tophi): Secondary | ICD-10-CM | POA: Insufficient documentation

## 2019-09-12 DIAGNOSIS — Z9889 Other specified postprocedural states: Secondary | ICD-10-CM

## 2019-09-12 DIAGNOSIS — F419 Anxiety disorder, unspecified: Secondary | ICD-10-CM | POA: Diagnosis not present

## 2019-09-12 DIAGNOSIS — F329 Major depressive disorder, single episode, unspecified: Secondary | ICD-10-CM | POA: Diagnosis not present

## 2019-09-12 DIAGNOSIS — M79672 Pain in left foot: Secondary | ICD-10-CM | POA: Insufficient documentation

## 2019-09-12 DIAGNOSIS — E785 Hyperlipidemia, unspecified: Secondary | ICD-10-CM | POA: Insufficient documentation

## 2019-09-12 DIAGNOSIS — G4730 Sleep apnea, unspecified: Secondary | ICD-10-CM | POA: Diagnosis not present

## 2019-09-12 DIAGNOSIS — Z79899 Other long term (current) drug therapy: Secondary | ICD-10-CM | POA: Diagnosis not present

## 2019-09-12 DIAGNOSIS — Z87891 Personal history of nicotine dependence: Secondary | ICD-10-CM | POA: Diagnosis not present

## 2019-09-12 DIAGNOSIS — M1A9XX1 Chronic gout, unspecified, with tophus (tophi): Secondary | ICD-10-CM | POA: Diagnosis present

## 2019-09-12 HISTORY — PX: MASS EXCISION: SHX2000

## 2019-09-12 SURGERY — EXCISION MASS
Anesthesia: General | Site: Foot | Laterality: Left

## 2019-09-12 MED ORDER — MIDAZOLAM HCL 5 MG/5ML IJ SOLN
INTRAMUSCULAR | Status: DC | PRN
  Administered 2019-09-12: 2 mg via INTRAVENOUS

## 2019-09-12 MED ORDER — KETAMINE HCL 10 MG/ML IJ SOLN
INTRAMUSCULAR | Status: DC | PRN
  Administered 2019-09-12 (×2): 10 mg via INTRAVENOUS

## 2019-09-12 MED ORDER — LACTATED RINGERS IV SOLN: INTRAVENOUS | Status: DC | PRN

## 2019-09-12 MED ORDER — FENTANYL CITRATE (PF) 100 MCG/2ML IJ SOLN
INTRAMUSCULAR | Status: AC
  Filled 2019-09-12: qty 2

## 2019-09-12 MED ORDER — DEXAMETHASONE SODIUM PHOSPHATE 4 MG/ML IJ SOLN
INTRAMUSCULAR | Status: DC | PRN
  Administered 2019-09-12: 4 mg

## 2019-09-12 MED ORDER — MIDAZOLAM HCL 2 MG/2ML IJ SOLN: 0.5000 mg | Freq: Once | INTRAMUSCULAR | Status: DC | PRN

## 2019-09-12 MED ORDER — CEFAZOLIN SODIUM-DEXTROSE 2-4 GM/100ML-% IV SOLN
2.0000 g | Freq: Once | INTRAVENOUS | Status: AC
  Administered 2019-09-12: 10:00:00 2 g via INTRAVENOUS
  Filled 2019-09-12: qty 100

## 2019-09-12 MED ORDER — PROPOFOL 10 MG/ML IV BOLUS
INTRAVENOUS | Status: DC | PRN
  Administered 2019-09-12: 60 mg via INTRAVENOUS
  Administered 2019-09-12: 10 mg via INTRAVENOUS
  Administered 2019-09-12 (×2): 20 mg via INTRAVENOUS
  Administered 2019-09-12: 10 mg via INTRAVENOUS
  Administered 2019-09-12 (×2): 20 mg via INTRAVENOUS

## 2019-09-12 MED ORDER — SODIUM CHLORIDE 0.9 % IR SOLN
Status: DC | PRN
  Administered 2019-09-12: 1000 mL

## 2019-09-12 MED ORDER — BUPIVACAINE HCL (PF) 0.5 % IJ SOLN
INTRAMUSCULAR | Status: AC
  Filled 2019-09-12: qty 30

## 2019-09-12 MED ORDER — LACTATED RINGERS IV SOLN
INTRAVENOUS | Status: DC
  Administered 2019-09-12: 1000 mL via INTRAVENOUS

## 2019-09-12 MED ORDER — PROPOFOL 500 MG/50ML IV EMUL
INTRAVENOUS | Status: DC | PRN
  Administered 2019-09-12: 100 ug/kg/min via INTRAVENOUS

## 2019-09-12 MED ORDER — PHENYLEPHRINE HCL (PRESSORS) 10 MG/ML IV SOLN
INTRAVENOUS | Status: DC | PRN
  Administered 2019-09-12: 80 ug via INTRAVENOUS
  Administered 2019-09-12: 60 ug via INTRAVENOUS

## 2019-09-12 MED ORDER — BUPIVACAINE HCL (PF) 0.5 % IJ SOLN
INTRAMUSCULAR | Status: DC | PRN
  Administered 2019-09-12: 20 mL

## 2019-09-12 MED ORDER — CEFAZOLIN SODIUM-DEXTROSE 2-4 GM/100ML-% IV SOLN: 2.0000 g | INTRAVENOUS | Status: DC

## 2019-09-12 MED ORDER — EPHEDRINE SULFATE 50 MG/ML IJ SOLN
INTRAMUSCULAR | Status: DC | PRN
  Administered 2019-09-12: 15 mg via INTRAVENOUS
  Administered 2019-09-12 (×2): 10 mg via INTRAVENOUS

## 2019-09-12 MED ORDER — CHLORHEXIDINE GLUCONATE CLOTH 2 % EX PADS: 6.0000 | MEDICATED_PAD | Freq: Once | CUTANEOUS | Status: DC

## 2019-09-12 MED ORDER — PROMETHAZINE HCL 25 MG/ML IJ SOLN: 6.2500 mg | INTRAMUSCULAR | Status: DC | PRN

## 2019-09-12 MED ORDER — KETAMINE HCL 50 MG/5ML IJ SOSY
PREFILLED_SYRINGE | INTRAMUSCULAR | Status: AC
  Filled 2019-09-12: qty 5

## 2019-09-12 MED ORDER — FENTANYL CITRATE (PF) 100 MCG/2ML IJ SOLN
INTRAMUSCULAR | Status: DC | PRN
  Administered 2019-09-12: 50 ug via INTRAVENOUS

## 2019-09-12 MED ORDER — LIDOCAINE HCL (PF) 1 % IJ SOLN
INTRAMUSCULAR | Status: AC
  Filled 2019-09-12: qty 30

## 2019-09-12 MED ORDER — MIDAZOLAM HCL 2 MG/2ML IJ SOLN
INTRAMUSCULAR | Status: AC
  Filled 2019-09-12: qty 2

## 2019-09-12 MED ORDER — PROPOFOL 10 MG/ML IV BOLUS
INTRAVENOUS | Status: AC
  Filled 2019-09-12: qty 40

## 2019-09-12 MED ORDER — DEXAMETHASONE SODIUM PHOSPHATE 4 MG/ML IJ SOLN
INTRAMUSCULAR | Status: AC
  Filled 2019-09-12: qty 1

## 2019-09-12 SURGICAL SUPPLY — 42 items
APL PRP STRL LF DISP 70% ISPRP (MISCELLANEOUS) ×1
APL SKNCLS STERI-STRIP NONHPOA (GAUZE/BANDAGES/DRESSINGS) ×1
BANDAGE ELASTIC 4 VELCRO NS (GAUZE/BANDAGES/DRESSINGS) ×1 IMPLANT
BANDAGE ESMARK 4X12 BL STRL LF (DISPOSABLE) ×1 IMPLANT
BENZOIN TINCTURE PRP APPL 2/3 (GAUZE/BANDAGES/DRESSINGS) ×1 IMPLANT
BLADE SURG 15 STRL LF DISP TIS (BLADE) ×2 IMPLANT
BLADE SURG 15 STRL SS (BLADE) ×4
BNDG CMPR 12X4 ELC STRL LF (DISPOSABLE) ×1
BNDG CMPR STD VLCR NS LF 5.8X4 (GAUZE/BANDAGES/DRESSINGS) ×1
BNDG CONFORM 2 STRL LF (GAUZE/BANDAGES/DRESSINGS) ×2 IMPLANT
BNDG ELASTIC 4X5.8 VLCR NS LF (GAUZE/BANDAGES/DRESSINGS) ×2 IMPLANT
BNDG ESMARK 4X12 BLUE STRL LF (DISPOSABLE) ×2
BNDG GAUZE ELAST 4 BULKY (GAUZE/BANDAGES/DRESSINGS) ×2 IMPLANT
CHLORAPREP W/TINT 26 (MISCELLANEOUS) ×2 IMPLANT
CLOTH BEACON ORANGE TIMEOUT ST (SAFETY) ×2 IMPLANT
COVER LIGHT HANDLE STERIS (MISCELLANEOUS) ×4 IMPLANT
COVER WAND RF STERILE (DRAPES) ×2 IMPLANT
CUFF TOURN SGL QUICK 18X4 (TOURNIQUET CUFF) ×1 IMPLANT
DECANTER SPIKE VIAL GLASS SM (MISCELLANEOUS) ×4 IMPLANT
DRSG ADAPTIC 3X8 NADH LF (GAUZE/BANDAGES/DRESSINGS) ×2 IMPLANT
ELECT REM PT RETURN 9FT ADLT (ELECTROSURGICAL) ×2
ELECTRODE REM PT RTRN 9FT ADLT (ELECTROSURGICAL) ×1 IMPLANT
GAUZE SPONGE 4X4 12PLY STRL (GAUZE/BANDAGES/DRESSINGS) ×2 IMPLANT
GLOVE BIO SURGEON STRL SZ7 (GLOVE) ×1 IMPLANT
GLOVE BIO SURGEON STRL SZ7.5 (GLOVE) ×2 IMPLANT
GLOVE BIOGEL PI IND STRL 7.0 (GLOVE) ×2 IMPLANT
GLOVE BIOGEL PI INDICATOR 7.0 (GLOVE) ×2
GLOVE ECLIPSE 7.0 STRL STRAW (GLOVE) ×1 IMPLANT
GOWN STRL REUS W/ TWL LRG LVL3 (GOWN DISPOSABLE) IMPLANT
GOWN STRL REUS W/TWL LRG LVL3 (GOWN DISPOSABLE) ×6 IMPLANT
MANIFOLD NEPTUNE II (INSTRUMENTS) ×2 IMPLANT
NDL HYPO 27GX1-1/4 (NEEDLE) ×2 IMPLANT
NEEDLE HYPO 27GX1-1/4 (NEEDLE) ×4 IMPLANT
NS IRRIG 1000ML POUR BTL (IV SOLUTION) ×2 IMPLANT
PACK BASIC LIMB (CUSTOM PROCEDURE TRAY) ×2 IMPLANT
PAD ABD 5X9 TENDERSORB (GAUZE/BANDAGES/DRESSINGS) ×2 IMPLANT
PAD ARMBOARD 7.5X6 YLW CONV (MISCELLANEOUS) ×2 IMPLANT
SET BASIN LINEN APH (SET/KITS/TRAYS/PACK) ×2 IMPLANT
SPONGE LAP 18X18 RF (DISPOSABLE) ×2 IMPLANT
STRIP CLOSURE SKIN 1/2X4 (GAUZE/BANDAGES/DRESSINGS) ×1 IMPLANT
SUT VIC AB 4-0 PS2 27 (SUTURE) ×2 IMPLANT
SYR CONTROL 10ML LL (SYRINGE) ×4 IMPLANT

## 2019-09-12 NOTE — Op Note (Signed)
OPERATIVE NOTE  DATE OF PROCEDURE 09/12/2019  SURGEON Marcheta Grammes, DPM  ASSISTANT SURGEON Jilda Panda, DPM  OR STAFF Circulator: Cox, Gershon Mussel, RN Relief Circulator: Glory Rosebush, RN Scrub Person: Karin Lieu, CST   PREOPERATIVE DIAGNOSIS 1.  Gouty tophi of the left foot 2.  Pain of the left foot  POSTOPERATIVE DIAGNOSIS Same  PROCEDURE Excision of gouty tophi of the left foot  ANESTHESIA General   HEMOSTASIS Pneumatic ankle tourniquet set at 250 mmHg  ESTIMATED BLOOD LOSS Minimal (<5 cc)  MATERIALS USED None  INJECTABLES 0.5% Marcaine plain  PATHOLOGY Gouty tophi of the left foot  COMPLICATIONS None  INDICATIONS: Symptomatic large palpable mass along the medial aspect of the first metatarsal of his left foot making shoes difficult to tolerate.  He has tried wearing various shoes with minimal relief.  DESCRIPTION OF THE PROCEDURE:  The patient was brought to the operating room and placed on the operative table in the supine position.  A pneumatic ankle tourniquet was applied to the operative extremity.  A timeout was performed.  Following sedation, the surgical site was anesthetized with 0.5% Marcaine plain.  The foot was then prepped, scrubbed, and draped in the usual sterile technique.  A second timeout was performed.  The foot was elevated, exsanguinated and the pneumatic ankle tourniquet inflated to 250 mmHg.    Attention was directed to the medial aspect of the left first MPJ.  A linear longitudinal incision was made overlying the location of the gouty tophi.  Dissection was continued deep in the gouty tophi identified.  The gouty tophi had eroded the medial first MPJ capsule.  The remaining first MPJ capsule was thin.  Due to patient movement, the procedure was paused while anesthesia converted the procedure from MAC to general.  The gouty tophi was dissected free of all soft tissue attachments, removed and passed from the operative field.  It  was submitted to pathology and saline for evaluation.  A prominent exostosis of the first metatarsal head was noted.  It was resected using a bone bone rongeur.  The wound was irrigated with copious amounts of sterile irrigant.  The remaining joint capsule was reapproximated using 3-0 Vicryl.  The subcutaneous structures were reapproximated using 4-0 Vicryl.  The skin was reapproximated using 4-0 Prolene.  Wound closure was reinforced with Steri-Strips.  A sterile compressive dressing was applied to the left foot.  The pneumatic ankle tourniquet was deflated and a prompt hyperemic response was noted to all digits of the left foot.  The patient tolerated the procedure well.  The patient was then transferred to PACU with vital signs stable and vascular status intact to all toes of the operative foot.

## 2019-09-12 NOTE — Transfer of Care (Signed)
Immediate Anesthesia Transfer of Care Note  Patient: [name]  Procedure(s) Performed: EXCISION GOUTY TOPHI (Left Foot)  Patient Location: PACU  Anesthesia Type:General  Level of Consciousness: awake  Airway & Oxygen Therapy: Patient Spontanous Breathing  Post-op Assessment: Report given to RN  Post vital signs: Reviewed  Last Vitals:  Vitals Value Taken Time  BP 129/73 09/12/19 1127  Temp    Pulse 101 09/12/19 1130  Resp 26 09/12/19 1130  SpO2 93 % 09/12/19 1130  Vitals shown include unvalidated device data.  Last Pain:  Vitals:   09/12/19 0846  TempSrc: Oral  PainSc: 0-No pain      Patients Stated Pain Goal: 10 ([id number])  Complications: No apparent anesthesia complications

## 2019-09-12 NOTE — Anesthesia Postprocedure Evaluation (Signed)
Anesthesia Post Note  Patient: [name] Dy  Procedure(s) Performed: EXCISION GOUTY TOPHI (Left Foot)  Patient location during evaluation: PACU Anesthesia Type: General Level of consciousness: awake and alert and oriented Pain management: pain level controlled Vital Signs Assessment: post-procedure vital signs reviewed and stable Respiratory status: spontaneous breathing Cardiovascular status: blood pressure returned to baseline and stable Postop Assessment: no apparent nausea or vomiting Anesthetic complications: no     Last Vitals:  Vitals:   09/12/19 1145 09/12/19 1156  BP: 119/72 127/64  Pulse: (!) 101 80  Resp: (!) 26 18  Temp:  36.8 C  SpO2: 94% 94%    Last Pain:  Vitals:   09/12/19 1156  TempSrc: Oral  PainSc: 0-No pain                 Charmeka Freeburg

## 2019-09-12 NOTE — Anesthesia Preprocedure Evaluation (Signed)
Anesthesia Evaluation  Patient identified by MRN, date of birth, ID band Patient awake  General Assessment Comment:Reports h/o awareness in past - wasn't distressed - may have been a MAC  Reviewed: Allergy & Precautions, NPO status , Patient's Chart, lab work & pertinent test results  History of Anesthesia Complications (+) AWARENESS UNDER ANESTHESIA and history of anesthetic complications  Airway Mallampati: I  TM Distance: >3 FB Neck ROM: Full    Dental no notable dental hx. (+) Edentulous Upper, Edentulous Lower   Pulmonary shortness of breath and with exertion, sleep apnea , former smoker,  Reports remote history of OSA -states lost a bunch of weight  No longer uses CPAP Reports DOE after COVID in 06/2019 Denies limitations    Pulmonary exam normal breath sounds clear to auscultation       Cardiovascular Exercise Tolerance: Good + DOE  Normal cardiovascular examI Rhythm:Regular Rate:Normal  Denies CP/MI DOE as described previously  Reports good ET   Neuro/Psych Anxiety Depression negative neurological ROS  negative psych ROS   GI/Hepatic Neg liver ROS, GERD  Controlled,Denies Sx today or chronic meds - occ OTC use for GERD   Endo/Other  negative endocrine ROS  Renal/GU negative Renal ROS  negative genitourinary   Musculoskeletal  (+) Arthritis , Osteoarthritis,  On indocin for Gout   Abdominal   Peds negative pediatric ROS (+)  Hematology negative hematology ROS (+)   Anesthesia Other Findings Hyperlipidemia on meds  Reproductive/Obstetrics negative OB ROS                             Anesthesia Physical Anesthesia Plan  ASA: II  Anesthesia Plan: MAC   Post-op Pain Management:    Induction: Intravenous  PONV Risk Score and Plan: 2 and Propofol infusion, TIVA and Midazolam  Airway Management Planned: Simple Face Mask and Nasal Cannula  Additional Equipment:   Intra-op  Plan:   Post-operative Plan:   Informed Consent: I have reviewed the patients History and Physical, chart, labs and discussed the procedure including the risks, benefits and alternatives for the proposed anesthesia with the patient or authorized representative who has indicated his/her understanding and acceptance.     Dental advisory given  Plan Discussed with: CRNA  Anesthesia Plan Comments: (Plan Full PPE use  Plan MAC d/w pt -WTP with same after Q&A)        Anesthesia Quick Evaluation

## 2019-09-12 NOTE — Brief Op Note (Signed)
BRIEF OPERATIVE NOTE  DATE OF PROCEDURE 09/12/2019  SURGEON Marcheta Grammes, DPM  ASSISTANT SURGEON Jilda Panda, DPM  OR STAFF Circulator: Cox, Gershon Mussel, RN Relief Circulator: Glory Rosebush, RN Scrub Person: Karin Lieu, CST   PREOPERATIVE DIAGNOSIS 1.  Gouty tophi of the left foot 2.  Pain of the left foot  POSTOPERATIVE DIAGNOSIS Same  PROCEDURE Excision of gouty tophi of the left foot  ANESTHESIA General   HEMOSTASIS Pneumatic ankle tourniquet set at 250 mmHg  ESTIMATED BLOOD LOSS Minimal (<5 cc)  MATERIALS USED None  INJECTABLES 0.5% Marcaine plain  PATHOLOGY Gouty tophi of the left foot  COMPLICATIONS None

## 2019-09-12 NOTE — Anesthesia Procedure Notes (Signed)
Procedure Name: LMA Insertion Date/Time: 09/12/2019 10:39 AM Performed by: Ollen Bowl, CRNA Pre-anesthesia Checklist: Patient identified, Patient being monitored, Emergency Drugs available, Timeout performed and Suction available Patient Re-evaluated:Patient Re-evaluated prior to induction Oxygen Delivery Method: Circle System Utilized Preoxygenation: Pre-oxygenation with 100% oxygen Induction Type: IV induction Ventilation: Mask ventilation without difficulty LMA: LMA inserted LMA Size: 4.0 Number of attempts: 1 Placement Confirmation: positive ETCO2 and breath sounds checked- equal and bilateral

## 2019-09-12 NOTE — H&P (Signed)
HISTORY AND PHYSICAL INTERVAL NOTE:  09/12/2019  9:32 AM  [name]  has presented today for surgery, with the diagnosis of gout tophi of the left foot.  The various methods of treatment have been discussed with the patient.  No guarantees were given.  After consideration of risks, benefits and other options for treatment, the patient has consented to surgery.  I have reviewed the patients' chart and labs.    Patient Vitals for the past 24 hrs:  BP Temp Temp src Pulse Resp SpO2 Height Weight  09/12/19 0846 130/80 98.2 F (36.8 C) Oral 72 17 96 % 5\' 10"  (1.778 m) 95.3 kg    A history and physical examination was performed in my office.  The patient was reexamined.  There have been no changes to this history and physical examination.  Marcheta Grammes, DPM

## 2019-09-12 NOTE — Discharge Instructions (Signed)
General Anesthesia, Adult, Care After This sheet gives you information about how to care for yourself after your procedure. Your health care provider may also give you more specific instructions. If you have problems or questions, contact your health care provider. What can I expect after the procedure? After the procedure, the following side effects are common:  Pain or discomfort at the IV site.  Nausea.  Vomiting.  Sore throat.  Trouble concentrating.  Feeling cold or chills.  Weak or tired.  Sleepiness and fatigue.  Soreness and body aches. These side effects can affect parts of the body that were not involved in surgery. Follow these instructions at home:  For at least 24 hours after the procedure:  Have a responsible adult stay with you. It is important to have someone help care for you until you are awake and alert.  Rest as needed.  Do not: ? Participate in activities in which you could fall or become injured. ? Drive. ? Use heavy machinery. ? Drink alcohol. ? Take sleeping pills or medicines that cause drowsiness. ? Make important decisions or sign legal documents. ? Take care of children on your own. Eating and drinking  Follow any instructions from your health care provider about eating or drinking restrictions.  When you feel hungry, start by eating small amounts of foods that are soft and easy to digest (bland), such as toast. Gradually return to your regular diet.  Drink enough fluid to keep your urine pale yellow.  If you vomit, rehydrate by drinking water, juice, or clear broth. General instructions  If you have sleep apnea, surgery and certain medicines can increase your risk for breathing problems. Follow instructions from your health care provider about wearing your sleep device: ? Anytime you are sleeping, including during daytime naps. ? While taking prescription pain medicines, sleeping medicines, or medicines that make you drowsy.  Return to  your normal activities as told by your health care provider. Ask your health care provider what activities are safe for you.  Take over-the-counter and prescription medicines only as told by your health care provider.  If you smoke, do not smoke without supervision.  Keep all follow-up visits as told by your health care provider. This is important. Contact a health care provider if:  You have nausea or vomiting that does not get better with medicine.  You cannot eat or drink without vomiting.  You have pain that does not get better with medicine.  You are unable to pass urine.  You develop a skin rash.  You have a fever.  You have redness around your IV site that gets worse. Get help right away if:  You have difficulty breathing.  You have chest pain.  You have blood in your urine or stool, or you vomit blood. Summary  After the procedure, it is common to have a sore throat or nausea. It is also common to feel tired.  Have a responsible adult stay with you for the first 24 hours after general anesthesia. It is important to have someone help care for you until you are awake and alert.  When you feel hungry, start by eating small amounts of foods that are soft and easy to digest (bland), such as toast. Gradually return to your regular diet.  Drink enough fluid to keep your urine pale yellow.  Return to your normal activities as told by your health care provider. Ask your health care provider what activities are safe for you. This information is not   intended to replace advice given to you by your health care provider. Make sure you discuss any questions you have with your health care provider. Document Revised: 07/08/2017 Document Reviewed: 02/18/2017 Elsevier Patient Education  2020 [employer] .These instructions will give you an idea of what to expect after surgery and how to manage issues that may arise before your first post op office visit.  Pain Management Pain is  best managed by "staying ahead" of it. If pain gets out of control, it is difficult to get it back under control. Local anesthesia that lasts 6-8 hours is used to numb the foot and decrease pain.  For the best pain control, take the pain medication every 4 hours for the first 2 days post op. On the third day pain medication can be taken as needed.   Post Op Nausea Nausea is common after surgery, so it is managed proactively.  If prescribed, use the prescribed nausea medication regularly for the first 2 days post op.  Bandages Do not worry if there is blood on the bandage. What looks like a lot of blood on the bandage is actually a small amount. Blood on the dressing spreads out as it is absorbed by the gauze, the same way a drop of water spreads out on a paper towel.  If the bandages feel wet or dry, stiff and uncomfortable, call the office during office hours and we will schedule a time for you to have the bandage changed.  Unless you are specifically told otherwise, we will do the first bandage change in the office.  Keep your bandage dry. If the bandage becomes wet or soiled, notify the office and we will schedule a time to change the bandage.  Activity It is best to spend most of the first 2 days after surgery lying down with the foot elevated above the level of your heart. You may put weight on your heel while wearing the surgical shoe.   You may only get up to go to the restroom.  Driving Do not drive until you are able to respond in an emergency (i.e. slam on the brakes). This usually occurs after the bone has healed - 6 to 8 weeks.  Call the Office If you have a fever over 101F.  If you have increasing pain after the initial post op pain has settled down.  If you have increasing redness, swelling, or drainage.  If you have any questions or concerns.   

## 2019-09-13 LAB — SURGICAL PATHOLOGY

## 2020-08-20 ENCOUNTER — Ambulatory Visit: Payer: Medicare Other | Admitting: Orthopedic Surgery

## 2020-08-20 ENCOUNTER — Ambulatory Visit: Payer: Medicare Other

## 2020-08-20 ENCOUNTER — Encounter: Payer: Self-pay | Admitting: Orthopedic Surgery

## 2020-08-20 ENCOUNTER — Other Ambulatory Visit: Payer: Self-pay

## 2020-08-20 VITALS — BP 165/93 | HR 78 | Ht 70.0 in | Wt 224.0 lb

## 2020-08-20 DIAGNOSIS — M1712 Unilateral primary osteoarthritis, left knee: Secondary | ICD-10-CM | POA: Diagnosis not present

## 2020-08-20 DIAGNOSIS — G8929 Other chronic pain: Secondary | ICD-10-CM

## 2020-08-20 DIAGNOSIS — M25562 Pain in left knee: Secondary | ICD-10-CM

## 2020-08-20 NOTE — Patient Instructions (Signed)

## 2020-08-20 NOTE — Progress Notes (Addendum)
New Patient Visit  Assessment: [name] is a 65 y.o. male with the following: Left knee arthritis; moderate to severe  Plan: [name] has moderate to severe arthritis in his left knee.  We reviewed the radiographs in clinic today, and I outlined the natural progression.  We had an extensive discussion regarding all potential treatment options, including continuing with the current treatment. NSAIDs are the most appropriate medications, and these are available OTC or via prescription.  I have urged him to remain active, and he can continue with activities on their own, or we can refer them to physical therapy.  If the pain is severe enough, we can consider a steroid injection.  If their knee pain is affecting their everyday activities, including sleep, knee replacement is a consideration, but we would have to refer them to see my partner Dr. Aline Brochure.  After discussing all of these options, the patient has elected to proceed with a steroid injection in clinic today.  If he is interested in a repeat injection, and would like more information regarding total knee replacement, we could schedule a follow up with Dr. Aline Brochure.  All questions were answered to their satisfaction.    Procedure note injection - Left Knee joint   Verbal consent was obtained to inject the Left Knee joint  Timeout was completed to confirm the site of injection.  The skin was prepped with alcohol and ethyl chloride was sprayed at the injection site.  A 21-gauge needle was used to inject 40 mg of Depo-Medrol and 1% lidocaine (3 cc) into the Left Knee using an Anterolateral approach.  There were no complications. A sterile bandage was applied.  Follow-up: Return if symptoms worsen or fail to improve.  Subjective:  Chief Complaint  Patient presents with  . Knee Pain    Lt knee pain gotten worse over past 2 months. NKI.    History of Present Illness: [name] is a 65 y.o. male who presents to  clinic today for evaluation of his left knee.  He has had progressive left knee pain over several years.  No specific injury.  It has been particularly bad over the past couple of months.  He has been taking ibuprofen for pain, but is unsure if this is helping.  He has tried a brace, as well as a compression sleeve and notes the compression helps more.  Pain is primarily located in the medial knee, as well as posterolateral.  He has radiating pains into his shins.  Also has some ankle pain.  No swelling in his knees.  No PT.  He has yet to have an injection.    Review of Systems: No fevers or chills No numbness or tingling No chest pain No shortness of breath No bowel or bladder dysfunction No GI distress No headaches   Medical History:  Past Medical History:  Diagnosis Date  . Anxiety    some panic attacks- off medications  . Arthritis   . Complication of anesthesia    woke up during surgery- Elbow surgery, 2009 and again with foot surgeryry   . Depression   . GERD (gastroesophageal reflux disease)    once in awhile  . Hypercholesteremia   . Sleep apnea    has lost 'abunch of weight' its been yrs since i used it    Past Surgical History:  Procedure Laterality Date  . ANTERIOR CERVICAL DECOMP/DISCECTOMY FUSION N/A 12/21/2017   Procedure: C3-4 ANTERIOR CERVICAL DECOMPRESSION/DISCECTOMY FUSION, ALLOGRAFT, PLATE;  Surgeon: Marybelle Killings, MD;  Location: [place] [facility];  Service: Orthopedics;  Laterality: N/A;  . COLONOSCOPY  2008   Dr. Oneida Alar: Hyperplastic polyp removed, diverticulosis.  . COLONOSCOPY WITH PROPOFOL N/A 07/11/2018   Procedure: COLONOSCOPY WITH PROPOFOL;  Surgeon: Danie Binder, MD;  Location: [facility];  Service: Endoscopy;  Laterality: N/A;  7:30am  . FOOT SURGERY    . left elbow     . LUMBAR LAMINECTOMY     back in 2015  . MASS EXCISION Left 09/12/2019   Procedure: EXCISION GOUTY TOPHI;  Surgeon: Caprice Beaver, DPM;  Location: [facility];  Service: Podiatry;   Laterality: Left;  . POLYPECTOMY  07/11/2018   Procedure: POLYPECTOMY;  Surgeon: Danie Binder, MD;  Location: [facility];  Service: Endoscopy;;  colon    Family History  Problem Relation Age of Onset  . Heart disease Mother   . Heart disease Father   . Other Cousin        colostomy  . Brain cancer Paternal Uncle   . Lung cancer Brother    Social History   Tobacco Use  . Smoking status: Former Smoker    Years: 12.00  . Smokeless tobacco: Never Used  Vaping Use  . Vaping Use: Never used  Substance Use Topics  . Alcohol use: No    Comment: recover alcolol 1985  . Drug use: No    Allergies  Allergen Reactions  . Codeine Other (See Comments)    Out of right state of mind.   . Meloxicam Itching    Current Meds  Medication Sig  . Ascorbic Acid (VITAMIN C PO) Take 1 tablet by mouth daily.  Marland Kitchen atorvastatin (LIPITOR) 40 MG tablet Take 40 mg by mouth at bedtime.   Marland Kitchen escitalopram (LEXAPRO) 20 MG tablet Take 20 mg by mouth every evening.   . Multiple Vitamins-Minerals (ZINC PO) Take 1 tablet by mouth daily.  Marland Kitchen VITAMIN D PO Take 1 capsule by mouth daily.    Objective: BP (!) 165/93   Pulse 78   Ht 5\' 10"  (1.778 m)   Wt 224 lb (101.6 kg)   BMI 32.14 kg/m   Physical Exam:  General:  Alert and oriented, no acute distress. Gait: Left sided antalgic gait  Left knee with mild effusion.  ROM 5-120.  Pain in posterior knee with prolonged extension.  +crepitus.  Significant pain with patellar grind. Negative Lachman.  Tenderness medial joint line.  Varus alignment.  Tenderness in posterior knee.    IMAGING: I personally ordered and reviewed the following images   XR left knee with degenerative changes in all three compartments.  Near complete loss of joint space medially.  Osteophytes throughout.  Varus alignment.   Impression: moderate to severe left knee arthritis.    New Medications:  No orders of the defined types were placed in this encounter.     Mordecai Rasmussen, MD  08/20/2020 9:05 PM

## 2020-09-30 ENCOUNTER — Ambulatory Visit: Payer: Medicare Other

## 2020-09-30 ENCOUNTER — Other Ambulatory Visit: Payer: Self-pay

## 2020-09-30 ENCOUNTER — Ambulatory Visit: Payer: Medicare Other | Admitting: Orthopedic Surgery

## 2020-09-30 ENCOUNTER — Encounter: Payer: Self-pay | Admitting: Orthopedic Surgery

## 2020-09-30 VITALS — BP 136/90 | HR 82 | Ht 70.0 in | Wt 233.0 lb

## 2020-09-30 DIAGNOSIS — M1712 Unilateral primary osteoarthritis, left knee: Secondary | ICD-10-CM

## 2020-09-30 DIAGNOSIS — M1711 Unilateral primary osteoarthritis, right knee: Secondary | ICD-10-CM

## 2020-09-30 DIAGNOSIS — M25561 Pain in right knee: Secondary | ICD-10-CM

## 2020-09-30 MED ORDER — PREDNISONE 10 MG (21) PO TBPK: ORAL_TABLET | ORAL | 0 refills | Status: DC

## 2020-09-30 NOTE — Progress Notes (Signed)
Orthopaedic Clinic Return  Assessment: [name] is a 65 y.o. male with the following: Bilateral knee OA; right currently worse than the left  Plan: Recently evaluated the patient for left knee arthritis, and a steroid injection improved his symptoms for 3.5 weeks.  He recently twisted his right knee, and has been having severe pain.  Radiographs of his right knee demonstrates a varus alignment, with complete loss of joint space within the medial compartment.  He also has patellofemoral osteophytes.  Outlined the same options as his left knee.  He will forego the steroid injection for now, but we did provide him with a prednisone Dosepak.  In addition, he was fitted for bilateral OA braces.  As we discussed at the last visit, if he is interested in pursuing total knee arthroplasty, we can have him evaluated by Dr. Aline Brochure.  As such, he would like to discuss this in more detail, and will schedule appointment with Dr. Aline Brochure.  Pending their discussion, he can consider a right knee steroid injection to improve his current symptoms.  Follow-up with me as needed.  Meds ordered this encounter  Medications  . predniSONE (STERAPRED UNI-PAK 21 TAB) 10 MG (21) TBPK tablet    Sig: 10 mg DS 12 as directed    Dispense:  48 tablet    Refill:  0    Body mass index is 33.43 kg/m.  Follow-up: Return for Follow up with Dr. Aline Brochure - bilateral knee OA; L>R.   Subjective:  Chief Complaint  Patient presents with  . Knee Pain    Right knee pain, Patient reports that he felt a pop last Thursday and it give way with him, some swelling, and 7/10 pain level today.      History of Present Illness: [name] is a 65 y.o. male who returns to clinic today for evaluation of his right knee.  Briefly, I saw him about a month ago, at which time he had a left knee steroid injection.  This improved his symptoms for 3-4 weeks.  More recently, he noted a popping sensation in his right knee, followed by  significant pain.  He has had some swelling, and is noticed worsening pain.  Around the same time, his left knee has started to bother him as well.  Initially, after the popping sensation, he had difficulty bearing weight.  Since then, it has progressively improved.  Review of Systems: No fevers or chills No numbness or tingling No chest pain No shortness of breath No bowel or bladder dysfunction No GI distress No headaches    Objective: BP 136/90   Pulse 82   Ht 5\' 10"  (1.778 m)   Wt 233 lb (105.7 kg)   BMI 33.43 kg/m   Physical Exam:  Alert and oriented, no acute distress.  Evaluation of the right knee demonstrates a mild effusion.  He has full range of motion.  Severe tenderness to palpation along the medial joint line.  Point tenderness to palpation along the lateral joint line.  Negative Lachman.  No increased laxity to varus or valgus stress.  Varus overall alignment.  Right-sided antalgic gait.  IMAGING: I personally ordered and reviewed the following images:   X-rays of the right knee were obtained in clinic today and demonstrates a varus alignment overall.  Complete loss of joint space in the medial compartment.  Small osteophytes are appreciated.  Osteophytes within the patellofemoral compartment as well.  Impression: Severe right knee arthritis, specifically within the medial compartment.  Mordecai Rasmussen, MD 09/30/2020 10:53 PM

## 2020-10-08 ENCOUNTER — Encounter: Payer: Self-pay | Admitting: Orthopedic Surgery

## 2020-10-08 ENCOUNTER — Ambulatory Visit: Payer: Medicare Other | Admitting: Orthopedic Surgery

## 2020-10-08 ENCOUNTER — Other Ambulatory Visit: Payer: Self-pay

## 2020-10-08 VITALS — BP 178/110 | HR 88 | Ht 70.0 in | Wt 233.0 lb

## 2020-10-08 DIAGNOSIS — M1712 Unilateral primary osteoarthritis, left knee: Secondary | ICD-10-CM | POA: Diagnosis not present

## 2020-10-08 DIAGNOSIS — G8929 Other chronic pain: Secondary | ICD-10-CM

## 2020-10-08 DIAGNOSIS — M1711 Unilateral primary osteoarthritis, right knee: Secondary | ICD-10-CM | POA: Diagnosis not present

## 2020-10-08 NOTE — Progress Notes (Signed)
[name] was seen by Dr. Amedeo Kinsman and recommended him to me for total knee.  Chief Complaint  Patient presents with  . Knee Pain    Bilateral wants to discuss surgery on left knee     65 year old male longstanding pain left knee patient complains of inability to sleep at night and difficulty with activities of daily living primarily has medial knee pain without any major loss of motion occasional swelling does well with prednisone is allergic to meloxicam  He also has some radicular pain in his left leg status post lumbar disc surgery that responds well to prednisone last dose was yesterday March 22  Past Medical History:  Diagnosis Date  . Anxiety    some panic attacks- off medications  . Arthritis   . Complication of anesthesia    woke up during surgery- Elbow surgery, 2009 and again with foot surgeryry   . Depression   . GERD (gastroesophageal reflux disease)    once in awhile  . Hypercholesteremia   . Sleep apnea    has lost 'abunch of weight' its been yrs since i used it   Past Surgical History:  Procedure Laterality Date  . ANTERIOR CERVICAL DECOMP/DISCECTOMY FUSION N/A 12/21/2017   Procedure: C3-4 ANTERIOR CERVICAL DECOMPRESSION/DISCECTOMY FUSION, ALLOGRAFT, PLATE;  Surgeon: Marybelle Killings, MD;  Location: [facility];  Service: Orthopedics;  Laterality: N/A;  . COLONOSCOPY  2008   Dr. Oneida Alar: Hyperplastic polyp removed, diverticulosis.  . COLONOSCOPY WITH PROPOFOL N/A 07/11/2018   Procedure: COLONOSCOPY WITH PROPOFOL;  Surgeon: Danie Binder, MD;  Location: [facility];  Service: Endoscopy;  Laterality: N/A;  7:30am  . FOOT SURGERY    . left elbow     . LUMBAR LAMINECTOMY     back in 2015  . MASS EXCISION Left 09/12/2019   Procedure: EXCISION GOUTY TOPHI;  Surgeon: Caprice Beaver, DPM;  Location: [facility];  Service: Podiatry;  Laterality: Left;  . POLYPECTOMY  07/11/2018   Procedure: POLYPECTOMY;  Surgeon: Danie Binder, MD;  Location: [facility];  Service:  Endoscopy;;  colon   Family History  Problem Relation Age of Onset  . Heart disease Mother   . Heart disease Father   . Other Cousin        colostomy  . Brain cancer Paternal Uncle   . Lung cancer Brother    Social History   Tobacco Use  . Smoking status: Former Smoker    Years: 12.00  . Smokeless tobacco: Never Used  Vaping Use  . Vaping Use: Never used  Substance Use Topics  . Alcohol use: No    Comment: recover alcolol 1985  . Drug use: No     Current Outpatient Medications:  .  Ascorbic Acid (VITAMIN C PO), Take 1 tablet by mouth daily., Disp: , Rfl:  .  atorvastatin (LIPITOR) 40 MG tablet, Take 40 mg by mouth at bedtime. , Disp: , Rfl:  .  escitalopram (LEXAPRO) 20 MG tablet, Take 20 mg by mouth every evening. , Disp: , Rfl:  .  Multiple Vitamins-Minerals (ZINC PO), Take 1 tablet by mouth daily., Disp: , Rfl:  .  predniSONE (STERAPRED UNI-PAK 21 TAB) 10 MG (21) TBPK tablet, 10 mg DS 12 as directed, Disp: 48 tablet, Rfl: 0 .  VITAMIN D PO, Take 1 capsule by mouth daily., Disp: , Rfl:   BP (!) 178/110   Pulse 88   Ht 5\' 10"  (1.778 m)   Wt 233 lb (105.7 kg)  BMI 33.43 kg/m   Physical Exam Constitutional:      General: He is not in acute distress.    Appearance: He is well-developed.     Comments: Well developed, well nourished Normal grooming and hygiene     Cardiovascular:     Comments: No peripheral edema Musculoskeletal:     Comments: Left knee Skin normal Small joint effusion Medial tenderness Range of motion is 2-1 25 Ligaments are stable Muscle tone is normal  Right knee Skin normal Tender medial joint line small effusion Similar range of motion to the left knee Ligaments are stable Muscle tone is normal  Skin:    General: Skin is warm and dry.  Neurological:     Mental Status: He is alert and oriented to person, place, and time.     Sensory: No sensory deficit.     Coordination: Coordination normal.     Gait: Gait abnormal.     Deep  Tendon Reflexes: Reflexes are normal and symmetric.  Psychiatric:        Mood and Affect: Mood normal.        Behavior: Behavior normal.        Thought Content: Thought content normal.        Judgment: Judgment normal.     Comments: Affect normal     X-rays show varus alignment significant osteophytes medial joint space is narrowed to bone-on-bone  Encounter Diagnoses  Name Primary?  Marland Kitchen Arthritis of left knee Yes  . Arthritis of right knee   . Chronic pain of both knees     The procedure has been fully reviewed with the patient; The risks and benefits of surgery have been discussed and explained and understood. Alternative treatment has also been reviewed, questions were encouraged and answered. The postoperative plan is also been reviewed.   He will stop his prednisone  He is amenable to left total knee  Schedule 30 days after today

## 2020-10-13 ENCOUNTER — Ambulatory Visit: Payer: Medicare Other | Admitting: Orthopedic Surgery

## 2020-10-30 NOTE — Patient Instructions (Signed)
Your procedure is scheduled on: 11/11/2020  Report to [facility]tr[facility] at   6:15  AM.  Call this number if you have problems the morning of surgery: [phone]   Remember:   Do not Eat or Drink after midnight         No Smoking the morning of surgery  :  Take these medicines the morning of surgery with A SIP OF WATER: Lexapro   Do not wear jewelry, make-up or nail polish.  Do not wear lotions, powders, or perfumes. You may wear deodorant.  Do not shave 48 hours prior to surgery. Men may shave face and neck.  Do not bring valuables to the hospital.  Contacts, dentures or bridgework may not be worn into surgery.  Leave suitcase in the car. After surgery it may be brought to your room.  For patients admitted to the hospital, checkout time is 11:00 AM the day of discharge.   Patients discharged the day of surgery will not be allowed to drive home.    Special Instructions: Shower using CHG night before surgery and shower the day of surgery use CHG.  Use special wash - you have one bottle of CHG for all showers.  You should use approximately 1/2 of the bottle for each shower.  How to Use Chlorhexidine for Bathing Chlorhexidine gluconate (CHG) is a germ-killing (antiseptic) solution that is used to clean the skin. It can get rid of the bacteria that normally live on the skin and can keep them away for about 24 hours. To clean your skin with CHG, you may be given:  A CHG solution to use in the shower or as part of a sponge bath.  A prepackaged cloth that contains CHG. Cleaning your skin with CHG may help lower the risk for infection:  While you are staying in the intensive care unit of the hospital.  If you have a vascular access, such as a central line, to provide short-term or long-term access to your veins.  If you have a catheter to drain urine from your bladder.  If you are on a ventilator. A ventilator is a machine that helps you breathe by moving air in and out of  your lungs.  After surgery. What are the risks? Risks of using CHG include:  A skin reaction.  Hearing loss, if CHG gets in your ears.  Eye injury, if CHG gets in your eyes and is not rinsed out.  The CHG product catching fire. Make sure that you avoid smoking and flames after applying CHG to your skin. Do not use CHG:  If you have a chlorhexidine allergy or have previously reacted to chlorhexidine.  On babies younger than 20 months of age. How to use CHG solution  Use CHG only as told by your health care provider, and follow the instructions on the label.  Use the full amount of CHG as directed. Usually, this is one bottle. During a shower Follow these steps when using CHG solution during a shower (unless your health care provider gives you different instructions): 1. Start the shower. 2. Use your normal soap and shampoo to wash your face and hair. 3. Turn off the shower or move out of the shower stream. 4. Pour the CHG onto a clean washcloth. Do not use any type of brush or rough-edged sponge. 5. Starting at your neck, lather your body down to your toes. Make sure you follow these instructions: ? If you will be having surgery, pay  special attention to the part of your body where you will be having surgery. Scrub this area for at least 1 minute. ? Do not use CHG on your head or face. If the solution gets into your ears or eyes, rinse them well with water. ? Avoid your genital area. ? Avoid any areas of skin that have broken skin, cuts, or scrapes. ? Scrub your back and under your arms. Make sure to wash skin folds. 6. Let the lather sit on your skin for 1-2 minutes or as long as told by your health care provider. 7. Thoroughly rinse your entire body in the shower. Make sure that all body creases and crevices are rinsed well. 8. Dry off with a clean towel. Do not put any substances on your body afterward--such as powder, lotion, or perfume--unless you are told to do so by your  health care provider. Only use lotions that are recommended by the manufacturer. 9. Put on clean clothes or pajamas. 10. If it is the night before your surgery, sleep in clean sheets.   During a sponge bath Follow these steps when using CHG solution during a sponge bath (unless your health care provider gives you different instructions): 1. Use your normal soap and shampoo to wash your face and hair. 2. Pour the CHG onto a clean washcloth. 3. Starting at your neck, lather your body down to your toes. Make sure you follow these instructions: ? If you will be having surgery, pay special attention to the part of your body where you will be having surgery. Scrub this area for at least 1 minute. ? Do not use CHG on your head or face. If the solution gets into your ears or eyes, rinse them well with water. ? Avoid your genital area. ? Avoid any areas of skin that have broken skin, cuts, or scrapes. ? Scrub your back and under your arms. Make sure to wash skin folds. 4. Let the lather sit on your skin for 1-2 minutes or as long as told by your health care provider. 5. Using a different clean, wet washcloth, thoroughly rinse your entire body. Make sure that all body creases and crevices are rinsed well. 6. Dry off with a clean towel. Do not put any substances on your body afterward--such as powder, lotion, or perfume--unless you are told to do so by your health care provider. Only use lotions that are recommended by the manufacturer. 7. Put on clean clothes or pajamas. 8. If it is the night before your surgery, sleep in clean sheets. How to use CHG prepackaged cloths  Only use CHG cloths as told by your health care provider, and follow the instructions on the label.  Use the CHG cloth on clean, dry skin.  Do not use the CHG cloth on your head or face unless your health care provider tells you to.  When washing with the CHG cloth: ? Avoid your genital area. ? Avoid any areas of skin that have  broken skin, cuts, or scrapes. Before surgery Follow these steps when using a CHG cloth to clean before surgery (unless your health care provider gives you different instructions): 1. Using the CHG cloth, vigorously scrub the part of your body where you will be having surgery. Scrub using a back-and-forth motion for 3 minutes. The area on your body should be completely wet with CHG when you are done scrubbing. 2. Do not rinse. Discard the cloth and let the area air-dry. Do not put any substances on  the area afterward, such as powder, lotion, or perfume. 3. Put on clean clothes or pajamas. 4. If it is the night before your surgery, sleep in clean sheets.   For general bathing Follow these steps when using CHG cloths for general bathing (unless your health care provider gives you different instructions). 1. Use a separate CHG cloth for each area of your body. Make sure you wash between any folds of skin and between your fingers and toes. Wash your body in the following order, switching to a new cloth after each step: ? The front of your neck, shoulders, and chest. ? Both of your arms, under your arms, and your hands. ? Your stomach and groin area, avoiding the genitals. ? Your right leg and foot. ? Your left leg and foot. ? The back of your neck, your back, and your buttocks. 2. Do not rinse. Discard the cloth and let the area air-dry. Do not put any substances on your body afterward--such as powder, lotion, or perfume--unless you are told to do so by your health care provider. Only use lotions that are recommended by the manufacturer. 3. Put on clean clothes or pajamas. Contact a health care provider if:  Your skin gets irritated after scrubbing.  You have questions about using your solution or cloth. Get help right away if:  Your eyes become very red or swollen.  Your eyes itch badly.  Your skin itches badly and is red or swollen.  Your hearing changes.  You have trouble  seeing.  You have swelling or tingling in your mouth or throat.  You have trouble breathing.  You swallow any chlorhexidine. Summary  Chlorhexidine gluconate (CHG) is a germ-killing (antiseptic) solution that is used to clean the skin. Cleaning your skin with CHG may help to lower your risk for infection.  You may be given CHG to use for bathing. It may be in a bottle or in a prepackaged cloth to use on your skin. Carefully follow your health care provider's instructions and the instructions on the product label.  Do not use CHG if you have a chlorhexidine allergy.  Contact your health care provider if your skin gets irritated after scrubbing. This information is not intended to replace advice given to you by your health care provider. Make sure you discuss any questions you have with your health care provider. Document Revised: 12/21/2019 Document Reviewed: 12/21/2019 Elsevier Patient Education  2021 [employer].  Total Knee Replacement, Care After After the procedure, it is common to have stiffness and discomfort, or redness, pain, and swelling around your cut from surgery (incision). You may also have a small amount of blood or clear fluid coming from your incision. Follow these instructions at home: Your doctor may give you more instructions. If you have problems, contact your doctor. Medicines  Take over-the-counter and prescription medicines only as told by your doctor.  If you were prescribed a blood thinner, take it as told by your doctor.  If told, take steps to prevent problems with pooping (constipation). You may need to: ? Drink enough fluid to keep your pee (urine) pale yellow. ? Take medicines. You will be told what medicines to take. ? Eat foods that are high in fiber. These include beans, whole grains, and fresh fruits and vegetables. ? Limit foods that are high in fat and sugar. These include fried or sweet foods. Incision care  Follow instructions from your  doctor about how to take care of your incision. Make sure you: ?  Wash your hands with soap and water for at least 20 seconds before and after you change your bandage. If you cannot use soap and water, use hand sanitizer. ? Change your bandage. ? Leave stitches, staples, or skin glue in place for at least 2 weeks. ? Leave tape strips alone unless you are told to take them off. You may trim the edges of the tape strips if they curl up.  Do not take baths, swim, or use a hot tub until your doctor says it is okay.  Check your incision every day for signs of infection. Check for: ? More redness, swelling, or pain. ? More fluid or blood. ? Warmth. ? Pus or a bad smell.   Activity  Rest as told by your doctor.  Get up to take short walks every 1 to 2 hours. Ask for help if you feel weak or unsteady.  Follow instructions from your doctor about: ? Using a walker, crutches, or a cane. ? How much body weight you may safely support on your affected leg (weight-bearing restrictions). ? How to get out of a bed and chair and how to go up and down stairs. A physical therapist will show you how to do this.  Do exercises as told by your doctor or physical therapist.  Avoid activities that put stress on your knees. These include running, jumping rope, and doing jumping jacks.  Do not play contact sports until your doctor says it is okay.  Return to your normal activities when your doctor says that it is safe. Managing pain, stiffness, and swelling  If told, put ice on your knee. To do this: ? Put ice in a plastic bag or use an icing device (cold flow pad). Follow your doctor's directions about how to use the icing device. ? Place a towel between your skin and the bag or between your skin and the icing device. ? Leave the ice on for 20 minutes, 2-3 times a day. ? Take off the ice if your skin turns bright red. This is very important. If you cannot feel pain, heat, or cold, you have a greater risk of  damage to the area.  Move your toes often.  Raise your leg above the level of your heart while you are sitting or lying down. ? Use a few pillows to keep your leg straight. ? Do not put a pillow just under the knee. If the knee is bent for a long time, this may make the knee stiff.  Wear elastic knee support as told by your doctor.   Safety  To help prevent falls: ? Keep floors clear of objects you may trip over. ? Place items that you may need within easy reach.  Wear an apron or tool belt with pockets for carrying objects. This leaves your hands free to help with your balance.  Do not drive or use machines until your doctor says it is safe.   General instructions  Wear compression stockings as told by your doctor.  Continue with breathing exercises. This helps prevent lung infection.  Do not smoke or use any products that contain nicotine or tobacco. If you need help quitting, ask your doctor.  If you plan to visit a dentist: ? Tell your doctor. Ask about things to do before your teeth are cleaned. ? Tell your dentist about your new joint.  Keep all follow-up visits. Contact a doctor if:  You have a fever or chills.  You have a cough or  feel short of breath.  Your medicine is not controlling your pain.  You have any of these signs of infection around your incision: ? More redness, swelling, or pain. ? More fluid or blood. ? Warmth. ? Pus or a bad smell.  You fall. Get help right away if:  You have very bad pain.  You have trouble breathing.  You have chest pain.  You have pain in your calf or leg.  You have redness, swelling, or warmth in your calf or leg.  Your incision breaks open after the stitches or staples are taken out. These symptoms may be an emergency. Get help right away. Call your local emergency services (911 in the [place]  Do not wait to see if the symptoms will go away.  Do not drive yourself to the hospital. Summary  After the  procedure, it is common to have stiffness and discomfort, or redness, pain, and swelling around your incision.  Follow instructions from your doctor about how to take care of your incision.  Use crutches, a walker, or a cane as told by your doctor.  If you were prescribed a blood thinner, take it as told by your doctor.  Keep all follow-up visits. This information is not intended to replace advice given to you by your health care provider. Make sure you discuss any questions you have with your health care provider. Document Revised: 12/25/2019 Document Reviewed: 12/25/2019 Elsevier Patient Education  2021 [employer] [facility]hesia, Adult, Care After This sheet gives you information about how to care for yourself after your procedure. Your health care provider may also give you more specific instructions. If you have problems or questions, contact your health care provider. What can I expect after the procedure? After the procedure, the following side effects are common:  Pain or discomfort at the IV site.  Nausea.  Vomiting.  Sore throat.  Trouble concentrating.  Feeling cold or chills.  Feeling weak or tired.  Sleepiness and fatigue.  Soreness and body aches. These side effects can affect parts of the body that were not involved in surgery. Follow these instructions at home: For the time period you were told by your health care provider:  Rest.  Do not participate in activities where you could fall or become injured.  Do not drive or use machinery.  Do not drink alcohol.  Do not take sleeping pills or medicines that cause drowsiness.  Do not make important decisions or sign legal documents.  Do not take care of children on your own.   Eating and drinking  Follow any instructions from your health care provider about eating or drinking restrictions.  When you feel hungry, start by eating small amounts of foods that are soft and easy to digest (bland),  such as toast. Gradually return to your regular diet.  Drink enough fluid to keep your urine pale yellow.  If you vomit, rehydrate by drinking water, juice, or clear broth. General instructions  If you have sleep apnea, surgery and certain medicines can increase your risk for breathing problems. Follow instructions from your health care provider about wearing your sleep device: ? Anytime you are sleeping, including during daytime naps. ? While taking prescription pain medicines, sleeping medicines, or medicines that make you drowsy.  Have a responsible adult stay with you for the time you are told. It is important to have someone help care for you until you are awake and alert.  Return to your normal activities as told by  your health care provider. Ask your health care provider what activities are safe for you.  Take over-the-counter and prescription medicines only as told by your health care provider.  If you smoke, do not smoke without supervision.  Keep all follow-up visits as told by your health care provider. This is important. Contact a health care provider if:  You have nausea or vomiting that does not get better with medicine.  You cannot eat or drink without vomiting.  You have pain that does not get better with medicine.  You are unable to pass urine.  You develop a skin rash.  You have a fever.  You have redness around your IV site that gets worse. Get help right away if:  You have difficulty breathing.  You have chest pain.  You have blood in your urine or stool, or you vomit blood. Summary  After the procedure, it is common to have a sore throat or nausea. It is also common to feel tired.  Have a responsible adult stay with you for the time you are told. It is important to have someone help care for you until you are awake and alert.  When you feel hungry, start by eating small amounts of foods that are soft and easy to digest (bland), such as toast.  Gradually return to your regular diet.  Drink enough fluid to keep your urine pale yellow.  Return to your normal activities as told by your health care provider. Ask your health care provider what activities are safe for you. This information is not intended to replace advice given to you by your health care provider. Make sure you discuss any questions you have with your health care provider. Document Revised: 03/20/2020 Document Reviewed: 10/18/2019 Elsevier Patient Education  2021 [employer].

## 2020-11-05 ENCOUNTER — Other Ambulatory Visit: Payer: Self-pay

## 2020-11-05 ENCOUNTER — Encounter (HOSPITAL_COMMUNITY)
Admission: RE | Admit: 2020-11-05 | Discharge: 2020-11-05 | Disposition: A | Discharge status: Home / Self Care | Payer: Medicare Other | Source: Ambulatory Visit | Attending: Orthopedic Surgery | Admitting: Orthopedic Surgery

## 2020-11-05 ENCOUNTER — Telehealth: Payer: Self-pay | Admitting: Radiology

## 2020-11-05 ENCOUNTER — Other Ambulatory Visit: Payer: Self-pay | Admitting: Orthopedic Surgery

## 2020-11-05 ENCOUNTER — Encounter (HOSPITAL_COMMUNITY): Payer: Self-pay

## 2020-11-05 DIAGNOSIS — Z01818 Encounter for other preprocedural examination: Secondary | ICD-10-CM | POA: Insufficient documentation

## 2020-11-05 DIAGNOSIS — M1712 Unilateral primary osteoarthritis, left knee: Secondary | ICD-10-CM

## 2020-11-05 LAB — CBC WITH DIFFERENTIAL/PLATELET
Abs Immature Granulocytes: 0.03 10*3/uL (ref 0.00–0.07)
Basophils Absolute: 0.1 10*3/uL (ref 0.0–0.1)
Basophils Relative: 1 %
Eosinophils Absolute: 0.1 10*3/uL (ref 0.0–0.5)
Eosinophils Relative: 1 %
HCT: 42.1 % (ref 39.0–52.0)
Hemoglobin: 14.2 g/dL (ref 13.0–17.0)
Immature Granulocytes: 0 %
Lymphocytes Relative: 31 %
Lymphs Abs: 2.2 10*3/uL (ref 0.7–4.0)
MCH: 29 pg (ref 26.0–34.0)
MCHC: 33.7 g/dL (ref 30.0–36.0)
MCV: 85.9 fL (ref 80.0–100.0)
Monocytes Absolute: 0.6 10*3/uL (ref 0.1–1.0)
Monocytes Relative: 9 %
Neutro Abs: 4.1 10*3/uL (ref 1.7–7.7)
Neutrophils Relative %: 58 %
Platelets: 278 10*3/uL (ref 150–400)
RBC: 4.9 MIL/uL (ref 4.22–5.81)
RDW: 13.5 % (ref 11.5–15.5)
WBC: 7.1 10*3/uL (ref 4.0–10.5)
nRBC: 0 % (ref 0.0–0.2)

## 2020-11-05 LAB — BASIC METABOLIC PANEL
Anion gap: 9 (ref 5–15)
BUN: 14 mg/dL (ref 8–23)
CO2: 24 mmol/L (ref 22–32)
Calcium: 9.5 mg/dL (ref 8.9–10.3)
Chloride: 103 mmol/L (ref 98–111)
Creatinine, Ser: 0.9 mg/dL (ref 0.61–1.24)
GFR, Estimated: >60 mL/min (ref >60)
Glucose, Bld: 105 mg/dL — ABNORMAL HIGH (ref 70–99)
Potassium: 4.1 mmol/L (ref 3.5–5.1)
Sodium: 136 mmol/L (ref 135–145)

## 2020-11-05 LAB — PREPARE RBC (CROSSMATCH)

## 2020-11-05 LAB — TYPE AND SCREEN
ABO/RH(D): O POS
Antibody Screen: NEGATIVE

## 2020-11-05 NOTE — Telephone Encounter (Signed)
[id number] is pending authorization number for the total knee replacement with [place] healthcare online

## 2020-11-07 ENCOUNTER — Other Ambulatory Visit: Payer: Self-pay

## 2020-11-07 ENCOUNTER — Other Ambulatory Visit (HOSPITAL_COMMUNITY)
Admission: RE | Admit: 2020-11-07 | Discharge: 2020-11-07 | Disposition: A | Discharge status: Home / Self Care | Payer: Medicare Other | Source: Ambulatory Visit | Attending: Orthopedic Surgery | Admitting: Orthopedic Surgery

## 2020-11-07 DIAGNOSIS — Z01812 Encounter for preprocedural laboratory examination: Secondary | ICD-10-CM | POA: Insufficient documentation

## 2020-11-07 DIAGNOSIS — Z20822 Contact with and (suspected) exposure to covid-19: Secondary | ICD-10-CM | POA: Insufficient documentation

## 2020-11-08 LAB — SARS CORONAVIRUS 2 (TAT 6-24 HRS): SARS Coronavirus 2: NEGATIVE

## 2020-11-10 NOTE — H&P (Signed)
[name] was seen by Dr. Amedeo Kinsman and recommended him to me for total knee.   Chief Complaint  Patient presents with  . Knee Pain      Bilateral wants to discuss surgery on left knee       65 year old male longstanding pain left knee patient complains of inability to sleep at night and difficulty with activities of daily living primarily has medial knee pain without any major loss of motion occasional swelling does well with prednisone is allergic to meloxicam   He also has some radicular pain in his left leg status post lumbar disc surgery that responds well to prednisone last dose was yesterday March 22       Past Medical History:  Diagnosis Date  . Anxiety      some panic attacks- off medications  . Arthritis    . Complication of anesthesia      woke up during surgery- Elbow surgery, 2009 and again with foot surgeryry   . Depression    . GERD (gastroesophageal reflux disease)      once in awhile  . Hypercholesteremia    . Sleep apnea      has lost 'abunch of weight' its been yrs since i used it         Past Surgical History:  Procedure Laterality Date  . ANTERIOR CERVICAL DECOMP/DISCECTOMY FUSION N/A 12/21/2017    Procedure: C3-4 ANTERIOR CERVICAL DECOMPRESSION/DISCECTOMY FUSION, ALLOGRAFT, PLATE;  Surgeon: Marybelle Killings, MD;  Location: [place];  Service: Orthopedics;  Laterality: N/A;  . COLONOSCOPY   2008    Dr. Oneida Alar: Hyperplastic polyp removed, diverticulosis.  . COLONOSCOPY WITH PROPOFOL N/A 07/11/2018    Procedure: COLONOSCOPY WITH PROPOFOL;  Surgeon: Danie Binder, MD;  Location: [facility];  Service: Endoscopy;  Laterality: N/A;  7:30am  . FOOT SURGERY      . left elbow       . LUMBAR LAMINECTOMY        back in 2015  . MASS EXCISION Left 09/12/2019    Procedure: EXCISION GOUTY TOPHI;  Surgeon: Caprice Beaver, DPM;  Location: [facility];  Service: Podiatry;  Laterality: Left;  . POLYPECTOMY   07/11/2018    Procedure: POLYPECTOMY;  Surgeon: Danie Binder, MD;   Location: [facility];  Service: Endoscopy;;  colon         Family History  Problem Relation Age of Onset  . Heart disease Mother    . Heart disease Father    . Other Cousin          colostomy  . Brain cancer Paternal Uncle    . Lung cancer Brother      Social History         Tobacco Use  . Smoking status: Former Smoker      Years: 12.00  . Smokeless tobacco: Never Used  Vaping Use  . Vaping Use: Never used  Substance Use Topics  . Alcohol use: No      Comment: recover alcolol 1985  . Drug use: No        Current Outpatient Medications:  .  Ascorbic Acid (VITAMIN C PO), Take 1 tablet by mouth daily., Disp: , Rfl:  .  atorvastatin (LIPITOR) 40 MG tablet, Take 40 mg by mouth at bedtime. , Disp: , Rfl:  .  escitalopram (LEXAPRO) 20 MG tablet, Take 20 mg by mouth every evening. , Disp: , Rfl:  .  Multiple Vitamins-Minerals (ZINC PO), Take 1 tablet by  mouth daily., Disp: , Rfl:  .  predniSONE (STERAPRED UNI-PAK 21 TAB) 10 MG (21) TBPK tablet, 10 mg DS 12 as directed, Disp: 48 tablet, Rfl: 0 .  VITAMIN D PO, Take 1 capsule by mouth daily., Disp: , Rfl:    BP (!) 178/110   Pulse 88   Ht 5\' 10"  (1.778 m)   Wt 233 lb (105.7 kg)   BMI 33.43 kg/m    Physical Exam Constitutional:      General: He is not in acute distress.    Appearance: He is well-developed.     Comments: Well developed, well nourished Normal grooming and hygiene     Cardiovascular:     Comments: No peripheral edema Musculoskeletal:     Comments: Left knee Skin normal Small joint effusion Medial tenderness Range of motion is 2-1 25 Ligaments are stable Muscle tone is normal  Right knee Skin normal Tender medial joint line small effusion Similar range of motion to the left knee Ligaments are stable Muscle tone is normal  Skin:    General: Skin is warm and dry.  Neurological:     Mental Status: He is alert and oriented to person, place, and time.     Sensory: No sensory deficit.      Coordination: Coordination normal.     Gait: Gait abnormal.     Deep Tendon Reflexes: Reflexes are normal and symmetric.  Psychiatric:        Mood and Affect: Mood normal.        Behavior: Behavior normal.        Thought Content: Thought content normal.        Judgment: Judgment normal.     Comments: Affect normal       X-rays show varus alignment significant osteophytes medial joint space is narrowed to bone-on-bone       Encounter Diagnoses  Name Primary?  Marland Kitchen Arthritis of left knee Yes  . Arthritis of right knee    . Chronic pain of both knees        The procedure has been fully reviewed with the patient; The risks and benefits of surgery have been discussed and explained and understood. Alternative treatment has also been reviewed, questions were encouraged and answered. The postoperative plan is also been reviewed.     He will stop his prednisone   He is amenable to left total knee

## 2020-11-11 ENCOUNTER — Encounter (HOSPITAL_COMMUNITY)
Admission: RE | Disposition: A | Discharge status: Home / Self Care | Payer: Self-pay | Source: Home / Self Care | Attending: Orthopedic Surgery

## 2020-11-11 ENCOUNTER — Ambulatory Visit (HOSPITAL_COMMUNITY): Payer: Medicare Other | Admitting: Anesthesiology

## 2020-11-11 ENCOUNTER — Ambulatory Visit (HOSPITAL_COMMUNITY): Payer: Medicare Other

## 2020-11-11 ENCOUNTER — Other Ambulatory Visit: Payer: Self-pay

## 2020-11-11 ENCOUNTER — Observation Stay (HOSPITAL_COMMUNITY)
Admission: RE | Admit: 2020-11-11 | Discharge: 2020-11-12 | Disposition: A | Discharge status: Home / Self Care | Payer: Medicare Other | Attending: Orthopedic Surgery | Admitting: Orthopedic Surgery

## 2020-11-11 ENCOUNTER — Encounter (HOSPITAL_COMMUNITY): Payer: Self-pay | Admitting: Orthopedic Surgery

## 2020-11-11 DIAGNOSIS — M1711 Unilateral primary osteoarthritis, right knee: Secondary | ICD-10-CM | POA: Diagnosis present

## 2020-11-11 DIAGNOSIS — Z96652 Presence of left artificial knee joint: Secondary | ICD-10-CM | POA: Diagnosis not present

## 2020-11-11 DIAGNOSIS — M1710 Unilateral primary osteoarthritis, unspecified knee: Secondary | ICD-10-CM | POA: Diagnosis present

## 2020-11-11 DIAGNOSIS — M1712 Unilateral primary osteoarthritis, left knee: Principal | ICD-10-CM

## 2020-11-11 DIAGNOSIS — G8918 Other acute postprocedural pain: Secondary | ICD-10-CM | POA: Diagnosis not present

## 2020-11-11 DIAGNOSIS — Z87891 Personal history of nicotine dependence: Secondary | ICD-10-CM | POA: Insufficient documentation

## 2020-11-11 DIAGNOSIS — Z471 Aftercare following joint replacement surgery: Secondary | ICD-10-CM | POA: Diagnosis not present

## 2020-11-11 HISTORY — PX: TOTAL KNEE ARTHROPLASTY: SHX125

## 2020-11-11 SURGERY — ARTHROPLASTY, KNEE, TOTAL
Anesthesia: Regional | Site: Knee | Laterality: Left

## 2020-11-11 MED ORDER — FENTANYL CITRATE (PF) 100 MCG/2ML IJ SOLN: 100.0000 ug | Freq: Once | INTRAMUSCULAR | Status: DC

## 2020-11-11 MED ORDER — EPHEDRINE 5 MG/ML INJ
INTRAVENOUS | Status: AC
  Filled 2020-11-11: qty 10

## 2020-11-11 MED ORDER — LACTATED RINGERS IV SOLN: INTRAVENOUS | Status: DC

## 2020-11-11 MED ORDER — MIDAZOLAM HCL 2 MG/2ML IJ SOLN
INTRAMUSCULAR | Status: AC
  Filled 2020-11-11: qty 2

## 2020-11-11 MED ORDER — CHLORHEXIDINE GLUCONATE CLOTH 2 % EX PADS
6.0000 | MEDICATED_PAD | Freq: Every day | CUTANEOUS | Status: DC
  Administered 2020-11-11 – 2020-11-12 (×2): 6 via TOPICAL

## 2020-11-11 MED ORDER — FENTANYL CITRATE (PF) 100 MCG/2ML IJ SOLN
INTRAMUSCULAR | Status: AC
  Filled 2020-11-11: qty 2

## 2020-11-11 MED ORDER — SODIUM CHLORIDE 0.9 % IV SOLN
INTRAVENOUS | Status: DC
  Administered 2020-11-11: 10 mL/h via INTRAVENOUS

## 2020-11-11 MED ORDER — MORPHINE SULFATE (PF) 2 MG/ML IV SOLN: 0.5000 mg | INTRAVENOUS | Status: DC | PRN

## 2020-11-11 MED ORDER — POVIDONE-IODINE 10 % EX SWAB
2.0000 "application " | Freq: Once | CUTANEOUS | Status: AC
  Administered 2020-11-11: 2 via TOPICAL

## 2020-11-11 MED ORDER — CEFAZOLIN SODIUM-DEXTROSE 2-4 GM/100ML-% IV SOLN
2.0000 g | Freq: Four times a day (QID) | INTRAVENOUS | Status: AC
  Administered 2020-11-11 (×2): 2 g via INTRAVENOUS
  Filled 2020-11-11 (×2): qty 100

## 2020-11-11 MED ORDER — LIDOCAINE 2% (20 MG/ML) 5 ML SYRINGE
INTRAMUSCULAR | Status: DC | PRN
  Administered 2020-11-11: 40 mg via INTRAVENOUS

## 2020-11-11 MED ORDER — ROCURONIUM BROMIDE 10 MG/ML (PF) SYRINGE
PREFILLED_SYRINGE | INTRAVENOUS | Status: DC | PRN
  Administered 2020-11-11: 70 mg via INTRAVENOUS

## 2020-11-11 MED ORDER — SEVOFLURANE IN SOLN
RESPIRATORY_TRACT | Status: AC
  Filled 2020-11-11: qty 250

## 2020-11-11 MED ORDER — PROPOFOL 10 MG/ML IV BOLUS
INTRAVENOUS | Status: AC
  Filled 2020-11-11: qty 20

## 2020-11-11 MED ORDER — LIDOCAINE HCL (PF) 2 % IJ SOLN
INTRAMUSCULAR | Status: AC
  Filled 2020-11-11: qty 5

## 2020-11-11 MED ORDER — PREGABALIN 50 MG PO CAPS
50.0000 mg | ORAL_CAPSULE | Freq: Once | ORAL | Status: AC
  Administered 2020-11-11: 50 mg via ORAL
  Filled 2020-11-11: qty 1

## 2020-11-11 MED ORDER — ROCURONIUM BROMIDE 10 MG/ML (PF) SYRINGE
PREFILLED_SYRINGE | INTRAVENOUS | Status: AC
  Filled 2020-11-11: qty 10

## 2020-11-11 MED ORDER — CELECOXIB 400 MG PO CAPS
400.0000 mg | ORAL_CAPSULE | Freq: Once | ORAL | Status: AC
  Administered 2020-11-11: 400 mg via ORAL
  Filled 2020-11-11: qty 1

## 2020-11-11 MED ORDER — PHENYLEPHRINE 40 MCG/ML (10ML) SYRINGE FOR IV PUSH (FOR BLOOD PRESSURE SUPPORT)
PREFILLED_SYRINGE | INTRAVENOUS | Status: AC
  Filled 2020-11-11: qty 20

## 2020-11-11 MED ORDER — ASPIRIN 81 MG PO CHEW
81.0000 mg | CHEWABLE_TABLET | Freq: Two times a day (BID) | ORAL | Status: DC
  Administered 2020-11-11 – 2020-11-12 (×2): 81 mg via ORAL
  Filled 2020-11-11 (×2): qty 1

## 2020-11-11 MED ORDER — MENTHOL 3 MG MT LOZG: 1.0000 | LOZENGE | OROMUCOSAL | Status: DC | PRN

## 2020-11-11 MED ORDER — DEXAMETHASONE SODIUM PHOSPHATE 10 MG/ML IJ SOLN
INTRAMUSCULAR | Status: AC
  Filled 2020-11-11: qty 1

## 2020-11-11 MED ORDER — TRAMADOL HCL 50 MG PO TABS
50.0000 mg | ORAL_TABLET | Freq: Four times a day (QID) | ORAL | Status: DC
  Administered 2020-11-11 – 2020-11-12 (×3): 50 mg via ORAL
  Filled 2020-11-11 (×3): qty 1

## 2020-11-11 MED ORDER — CEFAZOLIN SODIUM-DEXTROSE 2-4 GM/100ML-% IV SOLN
2.0000 g | INTRAVENOUS | Status: AC
  Administered 2020-11-11: 2 g via INTRAVENOUS

## 2020-11-11 MED ORDER — HYDROCODONE-ACETAMINOPHEN 5-325 MG PO TABS
1.0000 | ORAL_TABLET | Freq: Once | ORAL | Status: AC
Start: 2020-11-11 — End: 2020-11-11
  Administered 2020-11-11: 1 via ORAL
  Filled 2020-11-11: qty 1

## 2020-11-11 MED ORDER — DEXAMETHASONE SODIUM PHOSPHATE 4 MG/ML IJ SOLN
INTRAMUSCULAR | Status: DC | PRN
  Administered 2020-11-11: 8 mg via PERINEURAL

## 2020-11-11 MED ORDER — SODIUM CHLORIDE 0.9 % IR SOLN
Status: DC | PRN
  Administered 2020-11-11: 3000 mL

## 2020-11-11 MED ORDER — TRANEXAMIC ACID-NACL 1000-0.7 MG/100ML-% IV SOLN
1000.0000 mg | INTRAVENOUS | Status: AC
  Administered 2020-11-11: 1000 mg via INTRAVENOUS

## 2020-11-11 MED ORDER — EPHEDRINE SULFATE-NACL 50-0.9 MG/10ML-% IV SOSY
PREFILLED_SYRINGE | INTRAVENOUS | Status: DC | PRN
  Administered 2020-11-11 (×3): 5 mg via INTRAVENOUS
  Administered 2020-11-11: 10 mg via INTRAVENOUS

## 2020-11-11 MED ORDER — PHENYLEPHRINE 40 MCG/ML (10ML) SYRINGE FOR IV PUSH (FOR BLOOD PRESSURE SUPPORT)
PREFILLED_SYRINGE | INTRAVENOUS | Status: DC | PRN
  Administered 2020-11-11 (×2): 80 ug via INTRAVENOUS
  Administered 2020-11-11 (×2): 120 ug via INTRAVENOUS
  Administered 2020-11-11: 80 ug via INTRAVENOUS
  Administered 2020-11-11: 120 ug via INTRAVENOUS
  Administered 2020-11-11: 80 ug via INTRAVENOUS
  Administered 2020-11-11: 120 ug via INTRAVENOUS

## 2020-11-11 MED ORDER — DEXAMETHASONE SODIUM PHOSPHATE 4 MG/ML IJ SOLN
INTRAMUSCULAR | Status: DC | PRN
  Administered 2020-11-11: 10 mg via INTRAVENOUS

## 2020-11-11 MED ORDER — METOPROLOL TARTRATE 5 MG/5ML IV SOLN
2.5000 mg | Freq: Once | INTRAVENOUS | Status: AC
  Administered 2020-11-11: 2.5 mg via INTRAVENOUS

## 2020-11-11 MED ORDER — ACETAMINOPHEN 500 MG PO TABS
500.0000 mg | ORAL_TABLET | Freq: Four times a day (QID) | ORAL | Status: DC
  Administered 2020-11-11 – 2020-11-12 (×3): 500 mg via ORAL
  Filled 2020-11-11 (×3): qty 1

## 2020-11-11 MED ORDER — ORAL CARE MOUTH RINSE: 15.0000 mL | Freq: Once | OROMUCOSAL | Status: AC

## 2020-11-11 MED ORDER — KETAMINE HCL 10 MG/ML IJ SOLN
INTRAMUSCULAR | Status: DC | PRN
  Administered 2020-11-11: 20 mg via INTRAVENOUS
  Administered 2020-11-11: 10 mg via INTRAVENOUS

## 2020-11-11 MED ORDER — METHOCARBAMOL 1000 MG/10ML IJ SOLN
500.0000 mg | Freq: Four times a day (QID) | INTRAMUSCULAR | Status: DC | PRN
  Filled 2020-11-11: qty 5

## 2020-11-11 MED ORDER — PHENYLEPHRINE 40 MCG/ML (10ML) SYRINGE FOR IV PUSH (FOR BLOOD PRESSURE SUPPORT)
PREFILLED_SYRINGE | INTRAVENOUS | Status: AC
  Filled 2020-11-11: qty 10

## 2020-11-11 MED ORDER — HYDROCODONE-ACETAMINOPHEN 5-325 MG PO TABS
1.0000 | ORAL_TABLET | ORAL | Status: DC | PRN
  Administered 2020-11-11: 2 via ORAL
  Filled 2020-11-11: qty 2

## 2020-11-11 MED ORDER — ONDANSETRON HCL 4 MG PO TABS: 4.0000 mg | ORAL_TABLET | Freq: Four times a day (QID) | ORAL | Status: DC | PRN

## 2020-11-11 MED ORDER — 0.9 % SODIUM CHLORIDE (POUR BTL) OPTIME
TOPICAL | Status: DC | PRN
  Administered 2020-11-11: 1000 mL

## 2020-11-11 MED ORDER — FENTANYL CITRATE (PF) 100 MCG/2ML IJ SOLN
INTRAMUSCULAR | Status: DC
Start: 2020-11-11 — End: 2020-11-11
  Filled 2020-11-11: qty 2

## 2020-11-11 MED ORDER — PROPOFOL 10 MG/ML IV BOLUS
INTRAVENOUS | Status: DC | PRN
  Administered 2020-11-11: 150 mg via INTRAVENOUS

## 2020-11-11 MED ORDER — MIDAZOLAM HCL 5 MG/5ML IJ SOLN
INTRAMUSCULAR | Status: DC | PRN
  Administered 2020-11-11 (×2): 2 mg via INTRAVENOUS

## 2020-11-11 MED ORDER — BUPIVACAINE-EPINEPHRINE (PF) 0.5% -1:200000 IJ SOLN
INTRAMUSCULAR | Status: AC
  Filled 2020-11-11: qty 30

## 2020-11-11 MED ORDER — METOPROLOL TARTRATE 5 MG/5ML IV SOLN
2.5000 mg | INTRAVENOUS | Status: DC | PRN
  Administered 2020-11-11: 2.5 mg via INTRAVENOUS
  Filled 2020-11-11: qty 5

## 2020-11-11 MED ORDER — KETAMINE HCL 50 MG/5ML IJ SOSY
PREFILLED_SYRINGE | INTRAMUSCULAR | Status: AC
  Filled 2020-11-11: qty 5

## 2020-11-11 MED ORDER — ONDANSETRON HCL 4 MG/2ML IJ SOLN: 4.0000 mg | Freq: Four times a day (QID) | INTRAMUSCULAR | Status: DC | PRN

## 2020-11-11 MED ORDER — ONDANSETRON HCL 4 MG/2ML IJ SOLN
4.0000 mg | Freq: Once | INTRAMUSCULAR | Status: AC
  Administered 2020-11-11: 4 mg via INTRAVENOUS
  Filled 2020-11-11: qty 2

## 2020-11-11 MED ORDER — HYDROMORPHONE HCL 1 MG/ML IJ SOLN
INTRAMUSCULAR | Status: DC | PRN
  Administered 2020-11-11 (×2): .5 mg via INTRAVENOUS

## 2020-11-11 MED ORDER — FENTANYL CITRATE (PF) 250 MCG/5ML IJ SOLN
INTRAMUSCULAR | Status: AC
  Filled 2020-11-11: qty 5

## 2020-11-11 MED ORDER — CEFAZOLIN SODIUM-DEXTROSE 2-4 GM/100ML-% IV SOLN
INTRAVENOUS | Status: AC
  Filled 2020-11-11: qty 100

## 2020-11-11 MED ORDER — ESCITALOPRAM OXALATE 10 MG PO TABS
20.0000 mg | ORAL_TABLET | Freq: Every evening | ORAL | Status: DC
  Filled 2020-11-11: qty 2

## 2020-11-11 MED ORDER — ONDANSETRON HCL 4 MG/2ML IJ SOLN
INTRAMUSCULAR | Status: AC
  Filled 2020-11-11: qty 2

## 2020-11-11 MED ORDER — TRANEXAMIC ACID-NACL 1000-0.7 MG/100ML-% IV SOLN
INTRAVENOUS | Status: AC
  Filled 2020-11-11: qty 100

## 2020-11-11 MED ORDER — CHLORHEXIDINE GLUCONATE 0.12 % MT SOLN
15.0000 mL | Freq: Once | OROMUCOSAL | Status: AC
  Administered 2020-11-11: 15 mL via OROMUCOSAL

## 2020-11-11 MED ORDER — METOCLOPRAMIDE HCL 10 MG PO TABS
5.0000 mg | ORAL_TABLET | Freq: Three times a day (TID) | ORAL | Status: DC | PRN
Start: 2020-11-11 — End: 2020-11-12

## 2020-11-11 MED ORDER — ONDANSETRON HCL 4 MG/2ML IJ SOLN
INTRAMUSCULAR | Status: DC | PRN
  Administered 2020-11-11: 4 mg via INTRAVENOUS

## 2020-11-11 MED ORDER — BUPIVACAINE LIPOSOME 1.3 % IJ SUSP
INTRAMUSCULAR | Status: AC
  Filled 2020-11-11: qty 20

## 2020-11-11 MED ORDER — BUPIVACAINE LIPOSOME 1.3 % IJ SUSP
INTRAMUSCULAR | Status: DC | PRN
  Administered 2020-11-11: 20 mL

## 2020-11-11 MED ORDER — BUPIVACAINE-EPINEPHRINE (PF) 0.25% -1:200000 IJ SOLN
INTRAMUSCULAR | Status: DC | PRN
  Administered 2020-11-11: 28 mL via PERINEURAL

## 2020-11-11 MED ORDER — METOCLOPRAMIDE HCL 5 MG/ML IJ SOLN: 5.0000 mg | Freq: Three times a day (TID) | INTRAMUSCULAR | Status: DC | PRN

## 2020-11-11 MED ORDER — PHENOL 1.4 % MT LIQD: 1.0000 | OROMUCOSAL | Status: DC | PRN

## 2020-11-11 MED ORDER — HYDROMORPHONE HCL 1 MG/ML IJ SOLN
0.2500 mg | INTRAMUSCULAR | Status: DC | PRN
  Administered 2020-11-11 (×3): 0.5 mg via INTRAVENOUS
  Filled 2020-11-11 (×3): qty 0.5

## 2020-11-11 MED ORDER — SUGAMMADEX SODIUM 200 MG/2ML IV SOLN
INTRAVENOUS | Status: DC | PRN
  Administered 2020-11-11: 200 mg via INTRAVENOUS

## 2020-11-11 MED ORDER — MEPERIDINE HCL 50 MG/ML IJ SOLN: 6.2500 mg | INTRAMUSCULAR | Status: DC | PRN

## 2020-11-11 MED ORDER — BUPIVACAINE-EPINEPHRINE (PF) 0.25% -1:200000 IJ SOLN
INTRAMUSCULAR | Status: AC
  Filled 2020-11-11: qty 30

## 2020-11-11 MED ORDER — DOCUSATE SODIUM 100 MG PO CAPS
100.0000 mg | ORAL_CAPSULE | Freq: Two times a day (BID) | ORAL | Status: DC
  Administered 2020-11-11 – 2020-11-12 (×2): 100 mg via ORAL
  Filled 2020-11-11 (×2): qty 1

## 2020-11-11 MED ORDER — LIDOCAINE HCL (PF) 1 % IJ SOLN
INTRAMUSCULAR | Status: AC
  Filled 2020-11-11: qty 30

## 2020-11-11 MED ORDER — ONDANSETRON HCL 4 MG/2ML IJ SOLN: 4.0000 mg | Freq: Once | INTRAMUSCULAR | Status: DC | PRN

## 2020-11-11 MED ORDER — METHOCARBAMOL 1000 MG/10ML IJ SOLN
500.0000 mg | Freq: Once | INTRAVENOUS | Status: AC
  Administered 2020-11-11: 500 mg via INTRAVENOUS
  Filled 2020-11-11: qty 5

## 2020-11-11 MED ORDER — ATORVASTATIN CALCIUM 40 MG PO TABS
40.0000 mg | ORAL_TABLET | Freq: Every day | ORAL | Status: DC
  Administered 2020-11-11: 40 mg via ORAL
  Filled 2020-11-11: qty 1

## 2020-11-11 MED ORDER — TRANEXAMIC ACID-NACL 1000-0.7 MG/100ML-% IV SOLN
1000.0000 mg | Freq: Once | INTRAVENOUS | Status: AC
  Administered 2020-11-11: 1000 mg via INTRAVENOUS
  Filled 2020-11-11: qty 100

## 2020-11-11 MED ORDER — HYDROMORPHONE HCL 1 MG/ML IJ SOLN
INTRAMUSCULAR | Status: AC
  Filled 2020-11-11: qty 1

## 2020-11-11 MED ORDER — IBUPROFEN 800 MG PO TABS: 800.0000 mg | ORAL_TABLET | Freq: Every day | ORAL | Status: DC

## 2020-11-11 MED ORDER — FENTANYL CITRATE (PF) 100 MCG/2ML IJ SOLN
INTRAMUSCULAR | Status: DC | PRN
  Administered 2020-11-11 (×2): 50 ug via INTRAVENOUS
  Administered 2020-11-11: 100 ug via INTRAVENOUS
  Administered 2020-11-11: 50 ug via INTRAVENOUS

## 2020-11-11 MED ORDER — DEXAMETHASONE SODIUM PHOSPHATE 10 MG/ML IJ SOLN
10.0000 mg | Freq: Once | INTRAMUSCULAR | Status: AC
  Administered 2020-11-12: 10 mg via INTRAVENOUS
  Filled 2020-11-11: qty 1

## 2020-11-11 MED ORDER — MIDAZOLAM HCL 2 MG/2ML IJ SOLN: 2.0000 mg | Freq: Once | INTRAMUSCULAR | Status: DC

## 2020-11-11 MED ORDER — LIDOCAINE HCL (PF) 1 % IJ SOLN
INTRAMUSCULAR | Status: DC | PRN
  Administered 2020-11-11: 4 mL

## 2020-11-11 MED ORDER — DEXAMETHASONE SODIUM PHOSPHATE 4 MG/ML IJ SOLN
INTRAMUSCULAR | Status: AC
  Filled 2020-11-11: qty 2

## 2020-11-11 MED ORDER — METHOCARBAMOL 500 MG PO TABS: 500.0000 mg | ORAL_TABLET | Freq: Four times a day (QID) | ORAL | Status: DC | PRN

## 2020-11-11 SURGICAL SUPPLY — 72 items
ATTUNE MED DOME PAT 38 KNEE (Knees) ×1 IMPLANT
ATTUNE PS FEM LT SZ 8 CEM KNEE (Femur) ×1 IMPLANT
BANDAGE ELASTIC 4 VELCRO NS (GAUZE/BANDAGES/DRESSINGS) ×4 IMPLANT
BANDAGE ELASTIC 6 VELCRO NS (GAUZE/BANDAGES/DRESSINGS) ×1 IMPLANT
BANDAGE ESMARK 6X9 LF (GAUZE/BANDAGES/DRESSINGS) ×1 IMPLANT
BASE TIBIAL CEM ATTUNE SZ 7 (Knees) ×2 IMPLANT
BASEPLATE TIB CEM ATTUNE SZ7 (Knees) IMPLANT
BLADE HEX COATED 2.75 (ELECTRODE) ×2 IMPLANT
BLADE SAGITTAL 25.0X1.27X90 (BLADE) ×2 IMPLANT
BLADE SAW SGTL 11.0X1.19X90.0M (BLADE) ×2 IMPLANT
BLADE SURG SZ10 CARB STEEL (BLADE) ×1 IMPLANT
BNDG CMPR 9X6 STRL LF SNTH (GAUZE/BANDAGES/DRESSINGS) ×1
BNDG CMPR STD VLCR NS LF 5.8X4 (GAUZE/BANDAGES/DRESSINGS) ×2
BNDG CMPR STD VLCR NS LF 5.8X6 (GAUZE/BANDAGES/DRESSINGS) ×1
BNDG ELASTIC 4X5.8 VLCR NS LF (GAUZE/BANDAGES/DRESSINGS) ×4 IMPLANT
BNDG ELASTIC 6X5.8 VLCR NS LF (GAUZE/BANDAGES/DRESSINGS) ×2 IMPLANT
BNDG ESMARK 6X9 LF (GAUZE/BANDAGES/DRESSINGS) ×2
BSPLAT TIB 7 CMNT FX BRNG STRL (Knees) ×1 IMPLANT
CEMENT HV SMART SET (Cement) ×4 IMPLANT
CLOTH BEACON ORANGE TIMEOUT ST (SAFETY) ×2 IMPLANT
COOLER ICEMAN CLASSIC (MISCELLANEOUS) ×2 IMPLANT
COVER LIGHT HANDLE STERIS (MISCELLANEOUS) ×4 IMPLANT
COVER WAND RF STERILE (DRAPES) ×2 IMPLANT
CUFF TOURN SGL QUICK 34 (TOURNIQUET CUFF) ×2
CUFF TRNQT CYL 34X4.125X (TOURNIQUET CUFF) ×1 IMPLANT
DRAPE BACK TABLE (DRAPES) ×2 IMPLANT
DRAPE EXTREMITY T 121X128X90 (DISPOSABLE) ×2 IMPLANT
DRSG MEPILEX BORDER 4X12 (GAUZE/BANDAGES/DRESSINGS) ×2 IMPLANT
DURAPREP 26ML APPLICATOR (WOUND CARE) ×4 IMPLANT
ELECT REM PT RETURN 9FT ADLT (ELECTROSURGICAL) ×2
ELECTRODE REM PT RTRN 9FT ADLT (ELECTROSURGICAL) ×1 IMPLANT
GLOVE SKINSENSE NS SZ8.0 LF (GLOVE) ×2
GLOVE SKINSENSE STRL SZ8.0 LF (GLOVE) ×2 IMPLANT
GLOVE SS N UNI LF 8.5 STRL (GLOVE) ×2 IMPLANT
GLOVE SURG UNDER POLY LF SZ7 (GLOVE) ×6 IMPLANT
GOWN STRL REUS W/TWL LRG LVL3 (GOWN DISPOSABLE) ×6 IMPLANT
GOWN STRL REUS W/TWL XL LVL3 (GOWN DISPOSABLE) ×2 IMPLANT
HANDPIECE INTERPULSE COAX TIP (DISPOSABLE) ×2
HOOD W/PEELAWAY (MISCELLANEOUS) ×8 IMPLANT
INSERT TIBIA FIXED BEARING SZ8 (Insert) ×1 IMPLANT
INST SET MAJOR BONE (KITS) ×2 IMPLANT
IV NS IRRIG 3000ML ARTHROMATIC (IV SOLUTION) ×2 IMPLANT
KIT BLADEGUARD II DBL (SET/KITS/TRAYS/PACK) ×2 IMPLANT
KIT TURNOVER KIT A (KITS) ×2 IMPLANT
MANIFOLD NEPTUNE II (INSTRUMENTS) ×2 IMPLANT
MARKER SKIN DUAL TIP RULER LAB (MISCELLANEOUS) ×2 IMPLANT
NDL HYPO 18GX1.5 BLUNT FILL (NEEDLE) IMPLANT
NDL HYPO 21X1.5 SAFETY (NEEDLE) IMPLANT
NEEDLE HYPO 18GX1.5 BLUNT FILL (NEEDLE) ×2 IMPLANT
NEEDLE HYPO 21X1.5 SAFETY (NEEDLE) ×2 IMPLANT
NS IRRIG 1000ML POUR BTL (IV SOLUTION) ×2 IMPLANT
PACK TOTAL JOINT (CUSTOM PROCEDURE TRAY) ×2 IMPLANT
PAD ARMBOARD 7.5X6 YLW CONV (MISCELLANEOUS) ×3 IMPLANT
PAD COLD SHLDR WRAP-ON (PAD) ×2 IMPLANT
PENCIL SMOKE EVACUATOR (MISCELLANEOUS) ×2 IMPLANT
PILLOW KNEE EXTENSION 0 DEG (MISCELLANEOUS) ×2 IMPLANT
PIN/DRILL PACK ORTHO 1/8X3.0 (PIN) ×2 IMPLANT
SAW OSC TIP CART 19.5X105X1.3 (SAW) ×2 IMPLANT
SET BASIN LINEN APH (SET/KITS/TRAYS/PACK) ×2 IMPLANT
SET HNDPC FAN SPRY TIP SCT (DISPOSABLE) ×1 IMPLANT
STAPLER VISISTAT 35W (STAPLE) ×2 IMPLANT
SUT BRALON NAB BRD #1 30IN (SUTURE) ×3 IMPLANT
SUT MNCRL 0 VIOLET CTX 36 (SUTURE) ×1 IMPLANT
SUT MON AB 0 CT1 (SUTURE) ×2 IMPLANT
SUT MONOCRYL 0 CTX 36 (SUTURE) ×2
SYR 20ML LL LF (SYRINGE) ×2 IMPLANT
SYR BULB IRRIG 60ML STRL (SYRINGE) ×3 IMPLANT
TOWEL OR 17X26 4PK STRL BLUE (TOWEL DISPOSABLE) ×2 IMPLANT
TOWER CARTRIDGE SMART MIX (DISPOSABLE) ×2 IMPLANT
TRAY FOLEY MTR SLVR 16FR STAT (SET/KITS/TRAYS/PACK) ×2 IMPLANT
WATER STERILE IRR 1000ML POUR (IV SOLUTION) ×4 IMPLANT
YANKAUER SUCT 12FT TUBE ARGYLE (SUCTIONS) ×2 IMPLANT

## 2020-11-11 NOTE — Transfer of Care (Signed)
Immediate Anesthesia Transfer of Care Note  Patient: [name]  Procedure(s) Performed: LEFT TOTAL KNEE ARTHROPLASTY (Left Knee)  Patient Location: PACU  Anesthesia Type:General and Regional  Level of Consciousness: awake, alert  and oriented  Airway & Oxygen Therapy: Patient Spontanous Breathing and Patient connected to face mask oxygen  Post-op Assessment: Report given to RN and Post -op Vital signs reviewed and stable  Post vital signs: Reviewed and stable  Last Vitals:  Vitals Value Taken Time  BP 122/70 11/11/20 1023  Temp    Pulse 107 11/11/20 1025  Resp 18 11/11/20 1025  SpO2 98 % 11/11/20 1025  Vitals shown include unvalidated device data.  Last Pain:  Vitals:   11/11/20 0625  TempSrc: Oral         Complications: No complications documented.

## 2020-11-11 NOTE — Anesthesia Preprocedure Evaluation (Signed)
Anesthesia Evaluation  Patient identified by MRN, date of birth, ID band Patient awake    Reviewed: Allergy & Precautions, NPO status , Patient's Chart, lab work & pertinent test results  History of Anesthesia Complications (+) AWARENESS UNDER ANESTHESIA and history of anesthetic complications  Airway Mallampati: II  TM Distance: >3 FB Neck ROM: Full    Dental  (+) Edentulous Upper, Edentulous Lower   Pulmonary sleep apnea , former smoker,    Pulmonary exam normal breath sounds clear to auscultation       Cardiovascular Exercise Tolerance: Good Normal cardiovascular exam Rhythm:Regular Rate:Normal     Neuro/Psych PSYCHIATRIC DISORDERS Anxiety Depression  Neuromuscular disease (RLS)    GI/Hepatic Neg liver ROS, GERD  Controlled,  Endo/Other  negative endocrine ROS  Renal/GU negative Renal ROS     Musculoskeletal  (+) Arthritis  (Cervical and lumbar fusion), Osteoarthritis,    Abdominal   Peds  Hematology negative hematology ROS (+)   Anesthesia Other Findings   Reproductive/Obstetrics                            Anesthesia Physical Anesthesia Plan  ASA: II  Anesthesia Plan: General/Spinal   Post-op Pain Management:  Regional for Post-op pain   Induction: Intravenous  PONV Risk Score and Plan: 3 and Ondansetron, Dexamethasone and Midazolam  Airway Management Planned: Nasal Cannula, Natural Airway and Simple Face Mask  Additional Equipment:   Intra-op Plan:   Post-operative Plan:   Informed Consent: I have reviewed the patients History and Physical, chart, labs and discussed the procedure including the risks, benefits and alternatives for the proposed anesthesia with the patient or authorized representative who has indicated his/her understanding and acceptance.     Dental advisory given  Plan Discussed with: CRNA and Surgeon  Anesthesia Plan Comments: (Possible GA with  airway was discussed.)       Anesthesia Quick Evaluation

## 2020-11-11 NOTE — Plan of Care (Signed)
  Problem: Acute Rehab PT Goals(only PT should resolve) Goal: Pt Will Go Supine/Side To Sit Outcome: Progressing Flowsheets (Taken 11/11/2020 1551) Pt will go Supine/Side to Sit: with modified independence Goal: Patient Will Transfer Sit To/From Stand Outcome: Progressing Flowsheets (Taken 11/11/2020 1551) Patient will transfer sit to/from stand: with modified independence Goal: Pt Will Transfer Bed To Chair/Chair To Bed Outcome: Progressing Flowsheets (Taken 11/11/2020 1551) Pt will Transfer Bed to Chair/Chair to Bed: with modified independence Goal: Pt Will Ambulate Outcome: Progressing Flowsheets (Taken 11/11/2020 1551) Pt will Ambulate:  > 125 feet  with modified independence  with rolling walker   3:52 PM, 11/11/20 Lonell Grandchild, MPT Physical Therapist with [facility] 336 [phone] office [phone] mobile phone

## 2020-11-11 NOTE — Progress Notes (Signed)
Pt arrived to room via bed from PACU. A&O, rates left knee pain 4/10. Good movement and sensation of left foot/toes, cap refill brisk bilat toes. Ace wrap intact to left knee, DDI. Bone foam in place, ice machine sleeve in place. Oriented to room and safety procedures, pt and wife state understanding.

## 2020-11-11 NOTE — Op Note (Signed)
11/11/2020  10:22 AM  PATIENT:  [name]  65 y.o. male  PRE-OPERATIVE DIAGNOSIS:  left knee osteoarthritis  POST-OPERATIVE DIAGNOSIS:  left knee osteoarthritis  PROCEDURE:  Procedure(s): LEFT TOTAL KNEE ARTHROPLASTY (Left)   Findings grade 4 wear medial compartment ACL PCL intact menisci intact, grade 3 wear patellofemoral joint occluding trochlea and patella facets  Varus deformity was less than 10 degrees  Implants DePuy attune fixed-bearing posterior stabilized size 8 femur size 7 tibia size 5 polyethylene insert size 38 x 10 patella  Bone cuts 10 distal femur Patella original thickness 25, removed 11 mm Proximal tibia 9 mm reference from lateral side    SURGEON:  Surgeon(s) and Role:    Carole Civil, MD - Primary  PHYSICIAN ASSISTANT:   ASSISTANTS: Corrie Dandy and . Fulton Mole  ANESTHESIA:   general and Preop abductor canal block  EBL:  Min    BLOOD ADMINISTERED:none  DRAINS: none   LOCAL MEDICATIONS USED:  OTHER exparel 20  SPECIMEN:  No Specimen  DISPOSITION OF SPECIMEN:  N/A  COUNTS:  YES  TOURNIQUET:   Total Tourniquet Time Documented: Thigh (Left) - 103 minutes Total: Thigh (Left) - 103 minutes   DICTATION: .Viviann Spare Dictation  PLAN OF CARE: Admit to inpatient   PATIENT DISPOSITION:  PACU - hemodynamically stable.   Delay start of Pharmacological VTE agent (>24hrs) due to surgical blood loss or risk of bleeding: not applicable  Details of procedure  The patient was preop had in the preop holding area and the knee was evaluated and his vitals were stable he was cleared for surgery  He was taken to the operating room where spinal anesthetic was attempted but unsuccessful due to his previous lumbar fusion he was then placed under general anesthesia in the supine position.  A Foley catheter was inserted  His left leg was prepped and draped sterilely followed by timeout procedure  The limb was exsanguinated with a six-inch  Esmarch and the tourniquet was elevated to 280 mmHg.  A midline incision was made in the subcutaneous tissue was divided down to the extensor mechanism.  A medial arthrotomy was performed the patella was everted the fat pad was excised.  The medial soft tissue sleeve was elevated to the mid coronal plane and the medial meniscus was resected.  The anterior horn the lateral meniscus was also resected along with the ACL and PCL.  The 3/8 inch drill bit was entered into the femoral canal and the canal was suctioned and decompressed with irrigation.  The distal femoral cut was set for 5 degree left with 10 mm distal resection.  This was performed with an oscillating saw.  This was checked for flatness.  Preliminary measurement of the femur was between a 6 and a 7 in terms of sizing  We turned our attention to the tibia.  The tibia was subluxated forward and any meniscal tissue remaining was removed along with any posterior cruciate ligament fibers.  Once this was accomplished the tibial guide was set for external alignment with a 9 mm resection referencing the lateral side which was the higher side.  We used the tibial tubercle and the tibial spine as well as the malleolar I and ankle joint to line up the cut.  We aim for a neutral varus valgus cut with the mechanical axis of the tibia.  Once this was removed the proximal tibial plateau measured a size 7  47mm spacer block was placed in extension and the knee  seemed balanced in extension.  We then turned our attention back to the femur it measured a size 7.   We then placed the spacer block, size 5 in the flexion gap prior to the cut and it seemed that it would make an equal flexion gap so we proceeded with the 4 distal femoral cuts  We then placed a spacer block in flexion extension and got good gap balance  We then placed a 7 notch cutting device for the femur and made the cuts for the notch.  Trial reduction was performed however the 7 would not fit  even with further rasping and cutting the anterior flange cut we decided to try with an 8 and got an excellent fit with a balanced knee  We proceeded to prepare the proximal tibia and resect the patella from a 25 down to a 14 which was done with a freehand cut  The 3 holes for the patella were drilled the lug holes were drilled and the trial implants were removed  The joint was irrigated the cement was paired on the back table and then the implants were cemented in place  Passive flexion test was 110 degrees of flexion knee came to full extension with the implants in place there was no patellar subluxation  Further irrigation was performed and exparel 20 cc was injected into the collateral ligaments patellar tendon and the quadriceps tendon  Final irrigation was performed knee was closed with #1 Braylon in interrupted fashion for the extensor mechanism and then 0 Monocryl in interrupted and running fashion followed by staples  The tourniquet was released we had good color and capillary refill to the distal portion of the extremity Ace bandages were applied from the toes to the groin along with a Cryo/Cuff  The patient was extubated taken to recovery room in stable condition  Postop plan is for him to be full weightbearing start physical therapy immediately control pain and discharge after 1 night stay.

## 2020-11-11 NOTE — Addendum Note (Signed)
Addendum  created 11/11/20 1456 by Denese Killings, MD   Intraprocedure Meds edited

## 2020-11-11 NOTE — Brief Op Note (Signed)
11/11/2020  10:22 AM  PATIENT:  [name]  65 y.o. male  PRE-OPERATIVE DIAGNOSIS:  left knee osteoarthritis  POST-OPERATIVE DIAGNOSIS:  left knee osteoarthritis  PROCEDURE:  Procedure(s): LEFT TOTAL KNEE ARTHROPLASTY (Left)   Findings grade 4 wear medial compartment ACL PCL intact menisci intact, grade 3 wear patellofemoral joint occluding trochlea and patella facets  Varus deformity was less than 10 degrees  Implants DePuy attune fixed-bearing posterior stabilized size 8 femur size 7 tibia size 5 polyethylene insert size 38 x 10 patella  Bone cuts 10 distal femur Patella original thickness 25, removed 11 mm Proximal tibia 9 mm reference from lateral side    SURGEON:  Surgeon(s) and Role:    * Kirubel Aja E, MD - Primary  PHYSICIAN ASSISTANT:   ASSISTANTS: Debbie Dallas and . Cynthia Wren  ANESTHESIA:   general and Preop abductor canal block  EBL:  Min    BLOOD ADMINISTERED:none  DRAINS: none   LOCAL MEDICATIONS USED:  OTHER exparel 20  SPECIMEN:  No Specimen  DISPOSITION OF SPECIMEN:  N/A  COUNTS:  YES  TOURNIQUET:   Total Tourniquet Time Documented: Thigh (Left) - 103 minutes Total: Thigh (Left) - 103 minutes   DICTATION: .Dragon Dictation  PLAN OF CARE: Admit to inpatient   PATIENT DISPOSITION:  PACU - hemodynamically stable.   Delay start of Pharmacological VTE agent (>24hrs) due to surgical blood loss or risk of bleeding: not applicable  Details of procedure  The patient was preop had in the preop holding area and the knee was evaluated and his vitals were stable he was cleared for surgery  He was taken to the operating room where spinal anesthetic was attempted but unsuccessful due to his previous lumbar fusion he was then placed under general anesthesia in the supine position.  A Foley catheter was inserted  His left leg was prepped and draped sterilely followed by timeout procedure  The limb was exsanguinated with a six-inch  Esmarch and the tourniquet was elevated to 280 mmHg.  A midline incision was made in the subcutaneous tissue was divided down to the extensor mechanism.  A medial arthrotomy was performed the patella was everted the fat pad was excised.  The medial soft tissue sleeve was elevated to the mid coronal plane and the medial meniscus was resected.  The anterior horn the lateral meniscus was also resected along with the ACL and PCL.  The 3/8 inch drill bit was entered into the femoral canal and the canal was suctioned and decompressed with irrigation.  The distal femoral cut was set for 5 degree left with 10 mm distal resection.  This was performed with an oscillating saw.  This was checked for flatness.  Preliminary measurement of the femur was between a 6 and a 7 in terms of sizing  We turned our attention to the tibia.  The tibia was subluxated forward and any meniscal tissue remaining was removed along with any posterior cruciate ligament fibers.  Once this was accomplished the tibial guide was set for external alignment with a 9 mm resection referencing the lateral side which was the higher side.  We used the tibial tubercle and the tibial spine as well as the malleolar I and ankle joint to line up the cut.  We aim for a neutral varus valgus cut with the mechanical axis of the tibia.  Once this was removed the proximal tibial plateau measured a size 7  5mm spacer block was placed in extension and the knee   seemed balanced in extension.  We then turned our attention back to the femur it measured a size 7.   We then placed the spacer block, size 5 in the flexion gap prior to the cut and it seemed that it would make an equal flexion gap so we proceeded with the 4 distal femoral cuts  We then placed a spacer block in flexion extension and got good gap balance  We then placed a 7 notch cutting device for the femur and made the cuts for the notch.  Trial reduction was performed however the 7 would not fit  even with further rasping and cutting the anterior flange cut we decided to try with an 8 and got an excellent fit with a balanced knee  We proceeded to prepare the proximal tibia and resect the patella from a 25 down to a 14 which was done with a freehand cut  The 3 holes for the patella were drilled the lug holes were drilled and the trial implants were removed  The joint was irrigated the cement was paired on the back table and then the implants were cemented in place  Passive flexion test was 110 degrees of flexion knee came to full extension with the implants in place there was no patellar subluxation  Further irrigation was performed and exparel 20 cc was injected into the collateral ligaments patellar tendon and the quadriceps tendon  Final irrigation was performed knee was closed with #1 Braylon in interrupted fashion for the extensor mechanism and then 0 Monocryl in interrupted and running fashion followed by staples  The tourniquet was released we had good color and capillary refill to the distal portion of the extremity Ace bandages were applied from the toes to the groin along with a Cryo/Cuff  The patient was extubated taken to recovery room in stable condition  Postop plan is for him to be full weightbearing start physical therapy immediately control pain and discharge after 1 night stay.

## 2020-11-11 NOTE — Addendum Note (Signed)
Addendum  created 11/11/20 1500 by Denese Killings, MD   Child order released for a procedure order, Clinical Note Signed, Intraprocedure Blocks edited

## 2020-11-11 NOTE — Evaluation (Signed)
Physical Therapy Evaluation Patient Details Name: [name] MRN: [id number] DOB: [birth year] Today's Date: 11/11/2020    LEFT KNEE ROM:  0 - 90 degrees AMBULATION DISTANCE: 100 feet using RW with Supervison    History of Present Illness  [name] is a 65 y/o male, s/p Left TKA on 11/11/20, with the diagnosis of left knee osteoarthritis.  Clinical Impression  Patient instructed in HEP with good carryover and understanding acknowledged, good return for moving LLE during bed mobility, slightly labored cadence requiring verbal cues for proper use of RW, tends to pick up RW instead of pushing, good tolerance for weightbearing on LLE without loss of balance during gait training and tolerated sitting up in chair with LLE dangling after therapy - nursing staff notified.  Patient will benefit from continued physical therapy in hospital and recommended venue below to increase strength, balance, endurance for safe ADLs and gait.    Follow Up Recommendations Home health PT;Supervision for mobility/OOB;Supervision - Intermittent    Equipment Recommendations  None recommended by PT    Recommendations for Other Services       Precautions / Restrictions Precautions Precautions: Fall Restrictions Weight Bearing Restrictions: Yes LLE Weight Bearing: Weight bearing as tolerated      Mobility  Bed Mobility Overal bed mobility: Needs Assistance Bed Mobility: Supine to Sit     Supine to sit: Supervision     General bed mobility comments: slightly labored movement with good return for moving LLE during supine to sitting    Transfers Overall transfer level: Needs assistance Equipment used: Rolling walker (2 wheeled) Transfers: Sit to/from [facility] Sit to Stand: Supervision Stand pivot transfers: Supervision       General transfer comment: required verbal cues proper hand placement during sit to stands with good carryover  demonstrated  Ambulation/Gait Ambulation/Gait assistance: Supervision Gait Distance (Feet): 100 Feet Assistive device: Rolling walker (2 wheeled) Gait Pattern/deviations: Decreased step length - left;Decreased stance time - left;Decreased stride length;Antalgic;Step-to pattern Gait velocity: decreased   General Gait Details: slightly labored cadence with fair return for left heel to toe stepping without loss of balance, required verbal cues to push RW rather than picking up with fair carryover  Stairs            Wheelchair Mobility    Modified Rankin (Stroke Patients Only)       Balance Overall balance assessment: Needs assistance Sitting-balance support: Feet supported;No upper extremity supported Sitting balance-Leahy Scale: Good Sitting balance - Comments: seated at EOB   Standing balance support: During functional activity;Bilateral upper extremity supported Standing balance-Leahy Scale: Fair Standing balance comment: fair/good using RW                             Pertinent Vitals/Pain Pain Assessment: 0-10 Pain Score: 4  Pain Location: left knee Pain Descriptors / Indicators: Sore Pain Intervention(s): Limited activity within patient's tolerance;Monitored during session;Repositioned    Home Living Family/patient expects to be discharged to:: Private residence Living Arrangements: Alone Available Help at Discharge: Family;Friend(s);Available 24 hours/day Type of Home: Mobile home Home Access: Stairs to enter Entrance Stairs-Rails: Right;Left;Can reach both Entrance Stairs-Number of Steps: 3 Home Layout: One level Home Equipment: Shower seat;Bedside commode;Walker - 2 wheels      Prior Function Level of Independence: Independent         Comments: Hydrographic surveyor, drives     Hand Dominance   Dominant Hand: Right    Extremity/Trunk  Assessment   Upper Extremity Assessment Upper Extremity Assessment: Overall WFL for tasks  assessed    Lower Extremity Assessment Lower Extremity Assessment: Overall WFL for tasks assessed;LLE deficits/detail LLE Deficits / Details: grossly -4/5 LLE Sensation: WNL LLE Coordination: WNL    Cervical / Trunk Assessment Cervical / Trunk Assessment: Normal  Communication   Communication: No difficulties  Cognition Arousal/Alertness: Awake/alert Behavior During Therapy: WFL for tasks assessed/performed Overall Cognitive Status: Within Functional Limits for tasks assessed                                        General Comments      Exercises Total Joint Exercises Ankle Circles/Pumps: Supine;10 reps;Strengthening;Left;AROM Quad Sets: AROM;Strengthening;Left;10 reps;Supine Short Arc Quad: Supine;10 reps;Left;Strengthening;AROM Heel Slides: AROM;Strengthening;Left;10 reps;Supine Goniometric ROM: left knee: 0 - 90 degrees   Assessment/Plan    PT Assessment Patient needs continued PT services  PT Problem List Decreased strength;Decreased range of motion;Decreased activity tolerance;Decreased mobility;Decreased balance       PT Treatment Interventions DME instruction;Gait training;Stair training;Functional mobility training;Therapeutic activities;Therapeutic exercise;Balance training;Patient/family education    PT Goals (Current goals can be found in the Care Plan section)  Acute Rehab PT Goals Patient Stated Goal: return home with family to assist PT Goal Formulation: With patient Time For Goal Achievement: 11/13/20 Potential to Achieve Goals: Good    Frequency BID   Barriers to discharge        Co-evaluation               AM-PAC PT "6 Clicks" Mobility  Outcome Measure Help needed turning from your back to your side while in a flat bed without using bedrails?: None Help needed moving from lying on your back to sitting on the side of a flat bed without using bedrails?: None Help needed moving to and from a bed to a chair (including a  wheelchair)?: A Little Help needed standing up from a chair using your arms (e.g., wheelchair or bedside chair)?: A Little Help needed to walk in hospital room?: A Little Help needed climbing 3-5 steps with a railing? : A Little 6 Click Score: 20    End of Session   Activity Tolerance: Patient tolerated treatment well;Patient limited by fatigue Patient left: in chair;with call bell/phone within reach;with chair alarm set Nurse Communication: Mobility status PT Visit Diagnosis: Unsteadiness on feet (R26.81);Other abnormalities of gait and mobility (R26.89);Muscle weakness (generalized) (M62.81)    Time: 1436-1510 PT Time Calculation (min) (ACUTE ONLY): 34 min   Charges:   PT Evaluation $PT Eval Moderate Complexity: 1 Mod PT Treatments $Therapeutic Activity: 23-37 mins        3:50 PM, 11/11/20 Lonell Grandchild, MPT Physical Therapist with [facility] 336 [phone] office [phone] mobile phone

## 2020-11-11 NOTE — Anesthesia Postprocedure Evaluation (Signed)
Anesthesia Post Note  Patient: Searcy Miyoshi Barrell  Procedure(s) Performed: LEFT TOTAL KNEE ARTHROPLASTY (Left Knee)  Patient location during evaluation: PACU Anesthesia Type: General Level of consciousness: awake and alert and oriented Pain management: pain level controlled Vital Signs Assessment: post-procedure vital signs reviewed and stable Respiratory status: spontaneous breathing and respiratory function stable Cardiovascular status: blood pressure returned to baseline and tachycardic (metoprolol 5 mg was given in PACU) Postop Assessment: no apparent nausea or vomiting Anesthetic complications: no   No complications documented.   Last Vitals:  Vitals:   11/11/20 1315 11/11/20 1330  BP: 129/78 125/85  Pulse: (!) 103 (!) 106  Resp: 13 19  Temp:    SpO2: 95% 96%    Last Pain:  Vitals:   11/11/20 1330  TempSrc:   PainSc: 3                  Tawyna Pellot C Reese Stockman

## 2020-11-11 NOTE — Interval H&P Note (Signed)
History and Physical Interval Note:  11/11/2020 7:27 AM  [name]  has presented today for surgery, with the diagnosis of left knee osteoarthritis.  The various methods of treatment have been discussed with the patient and family. After consideration of risks, benefits and other options for treatment, the patient has consented to  Procedure(s): LEFT TOTAL KNEE ARTHROPLASTY (Left) as a surgical intervention.  The patient's history has been reviewed, patient examined, no change in status, stable for surgery.  I have reviewed the patient's chart and labs.  Questions were answered to the patient's satisfaction.     Arther Abbott

## 2020-11-11 NOTE — Progress Notes (Signed)
CRNA and anesthesia  at bedside for nerve block. Timeout completed by this RN and anesthesia  , fentynl and versed given by CRNA at [phone].

## 2020-11-11 NOTE — Progress Notes (Signed)
Pt received a total of 5 mg of metoprolol per Dr. Charna Elizabeth for increased heart rate. I gave 2.5 mg at 1212 and another 2.5 mg a hour later. HR now 105, prior 122.

## 2020-11-11 NOTE — Anesthesia Procedure Notes (Signed)
Anesthesia Regional Block: Adductor canal block   Pre-Anesthetic Checklist: ,, timeout performed, Correct Patient, Correct Site, Correct Laterality, Correct Procedure, Correct Position, site marked, Risks and benefits discussed,  Surgical consent,  Pre-op evaluation,  At surgeon's request and post-op pain management  Laterality: Left  Prep: chloraprep       Needles:  Injection technique: Single-shot  Needle Type: Echogenic Needle     Needle Length: 10cm  Needle Gauge: 20   Needle insertion depth: 6 cm   Additional Needles:   Procedures:,,,, ultrasound used (permanent image in chart),,,,   Nerve Stimulator or Paresthesia:  Response: Twitch elicited, 0.8 mA,   Additional Responses:   Narrative:  Start time: 11/11/2020 7:07 AM End time: 11/11/2020 7:15 AM Injection made incrementally with aspirations every 5 mL.  Performed by: Personally  Anesthesiologist: Denese Killings, MD  Additional Notes: BP cuff, EKG monitors applied. Sedation begun. After nerve location anesthetic injected incrementally, slowly , and after neg aspirations. Tolerated well.

## 2020-11-11 NOTE — Anesthesia Procedure Notes (Signed)
Procedure Name: Intubation Date/Time: 11/11/2020 8:01 AM Performed by: Orlie Dakin, CRNA Pre-anesthesia Checklist: Patient identified, Emergency Drugs available, Suction available and Patient being monitored Patient Re-evaluated:Patient Re-evaluated prior to induction Oxygen Delivery Method: Circle system utilized Preoxygenation: Pre-oxygenation with 100% oxygen Induction Type: IV induction Laryngoscope Size: Miller and 3 Grade View: Grade I Tube type: Oral Tube size: 7.5 mm Number of attempts: 1 Airway Equipment and Method: Stylet Placement Confirmation: ETT inserted through vocal cords under direct vision,  positive ETCO2 and breath sounds checked- equal and bilateral Secured at: 23 cm Tube secured with: Tape Dental Injury: Teeth and Oropharynx as per pre-operative assessment

## 2020-11-12 ENCOUNTER — Encounter (HOSPITAL_COMMUNITY): Payer: Self-pay | Admitting: Orthopedic Surgery

## 2020-11-12 DIAGNOSIS — Z96652 Presence of left artificial knee joint: Secondary | ICD-10-CM | POA: Diagnosis not present

## 2020-11-12 DIAGNOSIS — M1712 Unilateral primary osteoarthritis, left knee: Secondary | ICD-10-CM | POA: Diagnosis not present

## 2020-11-12 DIAGNOSIS — Z87891 Personal history of nicotine dependence: Secondary | ICD-10-CM | POA: Diagnosis not present

## 2020-11-12 LAB — CBC
HCT: 37.6 % — ABNORMAL LOW (ref 39.0–52.0)
Hemoglobin: 12.3 g/dL — ABNORMAL LOW (ref 13.0–17.0)
MCH: 29.3 pg (ref 26.0–34.0)
MCHC: 32.7 g/dL (ref 30.0–36.0)
MCV: 89.5 fL (ref 80.0–100.0)
Platelets: 234 10*3/uL (ref 150–400)
RBC: 4.2 MIL/uL — ABNORMAL LOW (ref 4.22–5.81)
RDW: 14.2 % (ref 11.5–15.5)
WBC: 14 10*3/uL — ABNORMAL HIGH (ref 4.0–10.5)
nRBC: 0 % (ref 0.0–0.2)

## 2020-11-12 LAB — BASIC METABOLIC PANEL
Anion gap: 10 (ref 5–15)
BUN: 20 mg/dL (ref 8–23)
CO2: 25 mmol/L (ref 22–32)
Calcium: 9.1 mg/dL (ref 8.9–10.3)
Chloride: 101 mmol/L (ref 98–111)
Creatinine, Ser: 0.98 mg/dL (ref 0.61–1.24)
GFR, Estimated: >60 mL/min (ref >60)
Glucose, Bld: 147 mg/dL — ABNORMAL HIGH (ref 70–99)
Potassium: 4.5 mmol/L (ref 3.5–5.1)
Sodium: 136 mmol/L (ref 135–145)

## 2020-11-12 MED ORDER — TRAMADOL HCL 50 MG PO TABS: 50.0000 mg | ORAL_TABLET | Freq: Four times a day (QID) | ORAL | 5 refills | Status: AC | PRN

## 2020-11-12 MED ORDER — HYDROCODONE-ACETAMINOPHEN 5-325 MG PO TABS: 1.0000 | ORAL_TABLET | Freq: Four times a day (QID) | ORAL | 0 refills | Status: AC | PRN

## 2020-11-12 MED ORDER — METHOCARBAMOL 500 MG PO TABS: 500.0000 mg | ORAL_TABLET | Freq: Four times a day (QID) | ORAL | 0 refills | Status: DC | PRN

## 2020-11-12 MED ORDER — ASPIRIN 81 MG PO CHEW: 81.0000 mg | CHEWABLE_TABLET | Freq: Two times a day (BID) | ORAL | 0 refills | Status: DC

## 2020-11-12 MED ORDER — DOCUSATE SODIUM 100 MG PO CAPS
100.0000 mg | ORAL_CAPSULE | Freq: Two times a day (BID) | ORAL | 0 refills | Status: DC
Start: 2020-11-12 — End: 2022-10-21

## 2020-11-12 NOTE — Progress Notes (Signed)
Physical Therapy Treatment Patient Details Name: [name] MRN: [id number] DOB: [birth year] Today's Date: 11/12/2020  LEFT KNEE ROM:  0 - 95 degrees AMBULATION DISTANCE: 150 feet using RW with Modified Independent   History of Present Illness [name] is a 65 y/o male, s/p Left TKA on 11/11/20, with the diagnosis of left knee osteoarthritis.    PT Comments    Patient demonstrates increased endurance/distance for gait training with good return for left heel to toe stepping without loss of balance, good return for going up/down steps using bilateral side rails without loss of balance and understanding acknowledged.  Patient tolerated sitting up in chair after therapy with LLE dangling.  Patient will benefit from continued physical therapy in hospital and recommended venue below to increase strength, balance, endurance for safe ADLs and gait.   Follow Up Recommendations        Equipment Recommendations       Recommendations for Other Services       Precautions / Restrictions Precautions Precautions: Fall Restrictions Weight Bearing Restrictions: Yes LLE Weight Bearing: Weight bearing as tolerated    Mobility  Bed Mobility Overal bed mobility: Modified Independent             General bed mobility comments: demonstrates good return for propping up on elbows to hands for supine to sitting and moving LLE without assistance    Transfers Overall transfer level: Modified independent               General transfer comment: good return for transferring to chair using RW  Ambulation/Gait Ambulation/Gait assistance: Modified independent (Device/Increase time) Gait Distance (Feet): 150 Feet Assistive device: Rolling walker (2 wheeled) Gait Pattern/deviations: Decreased step length - left;Decreased stance time - left;Decreased stride length;Antalgic;Step-to pattern Gait velocity: decreased   General Gait Details: increased endurance/distance with slightly  labored cadence with good return for left heel to toe stepping and much improvement for properly using RW   Stairs Stairs: Yes Stairs assistance: Modified independent (Device/Increase time);Supervision Stair Management: Two rails;Step to pattern Number of Stairs: 3 General stair comments: demonstrates good return for going up/down steps in stairwell using Bilateral rails without loss of balance and understanding acknowledged   Wheelchair Mobility    Modified Rankin (Stroke Patients Only)       Balance Overall balance assessment: Needs assistance Sitting-balance support: Feet supported;No upper extremity supported Sitting balance-Leahy Scale: Good Sitting balance - Comments: seated at EOB   Standing balance support: During functional activity;Bilateral upper extremity supported Standing balance-Leahy Scale: Fair Standing balance comment: fair/good using RW                            Cognition Arousal/Alertness: Awake/alert Behavior During Therapy: WFL for tasks assessed/performed Overall Cognitive Status: Within Functional Limits for tasks assessed                                        Exercises Total Joint Exercises Ankle Circles/Pumps: Supine;10 reps;Strengthening;Left;AROM Quad Sets: AROM;Strengthening;Left;10 reps;Supine Short Arc Quad: Supine;10 reps;Left;Strengthening;AROM Heel Slides: AROM;Strengthening;Left;10 reps;Supine Goniometric ROM: left knee: 0 - 95 degrees    General Comments        Pertinent Vitals/Pain Pain Assessment: 0-10 Pain Score: 3  Pain Location: left knee Pain Descriptors / Indicators: Sore Pain Intervention(s): Limited activity within patient's tolerance;Monitored during session;Repositioned    Home Living  Prior Function            PT Goals (current goals can now be found in the care plan section) Acute Rehab PT Goals Patient Stated Goal: return home with family to  assist PT Goal Formulation: With patient Time For Goal Achievement: 11/13/20 Potential to Achieve Goals: Good Progress towards PT goals: Progressing toward goals    Frequency    BID      PT Plan Current plan remains appropriate    Co-evaluation              AM-PAC PT "6 Clicks" Mobility   Outcome Measure  Help needed turning from your back to your side while in a flat bed without using bedrails?: None Help needed moving from lying on your back to sitting on the side of a flat bed without using bedrails?: None Help needed moving to and from a bed to a chair (including a wheelchair)?: None Help needed standing up from a chair using your arms (e.g., wheelchair or bedside chair)?: None Help needed to walk in hospital room?: A Little Help needed climbing 3-5 steps with a railing? : A Little 6 Click Score: 22    End of Session   Activity Tolerance: Patient tolerated treatment well Patient left: in chair;with call bell/phone within reach Nurse Communication: Mobility status PT Visit Diagnosis: Unsteadiness on feet (R26.81);Other abnormalities of gait and mobility (R26.89);Muscle weakness (generalized) (M62.81)     Time: 5427-0623 PT Time Calculation (min) (ACUTE ONLY): 30 min  Charges:  $Gait Training: 8-22 mins $Therapeutic Exercise: 8-22 mins                     9:44 AM, 11/12/20 [name], MPT Physical Therapist with [facility] [phone] office [phone] mobile phone

## 2020-11-12 NOTE — Discharge Summary (Signed)
Physician Discharge Summary  Patient ID: [name] MRN: [id number] DOB/AGE: Apr 05, 1956 65 y.o.  Admit date: 11/11/2020 Discharge date: 11/12/2020  Admission Diagnoses: OSTEOARTHRITIS LEFT KNEE   Discharge Diagnoses: OSTEOARTHRITIS LEFT KNEE   Discharged Condition: good  Procedure: TKA LEFT KNEE ATTUNE PS FB 75F  7T  5 POLY 38 P  Hospital Course: SMOOTH COURSE   CBC Latest Ref Rng & Units 11/12/2020 11/05/2020 07/08/2019  WBC 4.0 - 10.5 K/uL 14.0(H) 7.1 10.5  Hemoglobin 13.0 - 17.0 g/dL 12.3(L) 14.2 12.7(L)  Hematocrit 39.0 - 52.0 % 37.6(L) 42.1 38.5(L)  Platelets 150 - 400 K/uL 234 278 293   BMP Latest Ref Rng & Units 11/05/2020 07/08/2019 12/15/2017  Glucose 70 - 99 mg/dL 105(H) 114(H) 88  BUN 8 - 23 mg/dL 14 20 11   Creatinine 0.61 - 1.24 mg/dL 0.90 1.08 1.08  Sodium 135 - 145 mmol/L 136 135 136  Potassium 3.5 - 5.1 mmol/L 4.1 4.1 4.2  Chloride 98 - 111 mmol/L 103 100 101  CO2 22 - 32 mmol/L 24 25 25   Calcium 8.9 - 10.3 mg/dL 9.5 8.5(L) 9.6         Discharge Exam: BP 113/64 (BP Location: Left Arm)   Pulse 99   Temp 98.3 F (36.8 C)   Resp 20   SpO2 100%  Physical Exam    Disposition: Discharge disposition: 01-Home or Self Care       Discharge Instructions    CPM   Complete by: As directed    Continuous passive motion machine (CPM):      Use the CPM from 0 to 80 for 4 hours per day.      You may increase by 10 per day.  You may break it up into 2 or 3 sessions per day.      Use CPM for 2 weeks or until you are told to stop.   Call MD / Call 911   Complete by: As directed    If you experience chest pain or shortness of breath, CALL 911 and be transported to the hospital emergency room.  If you develope a fever above 101 F, pus (white drainage) or increased drainage or redness at the wound, or calf pain, call your surgeon's office.   Change dressing   Complete by: As directed    Do not Change dressing   Constipation Prevention   Complete by: As  directed    Drink plenty of fluids.  Prune juice may be helpful.  You may use a stool softener, such as Colace (over the counter) 100 mg twice a day.  Use MiraLax (over the counter) for constipation as needed.   Diet - low sodium heart healthy   Complete by: As directed    Discharge instructions   Complete by: As directed    Keep white stockings on for 4 weeks  Use blue bone foam 30 min 4  x a day   Use the ice cuff as needed for swelling at least 4 x a day for 1 hr   Do not put a pillow under the knee. Place it under the heel.   Complete by: As directed    Increase activity slowly as tolerated   Complete by: As directed    Post-operative opioid taper instructions:   Complete by: As directed    POST-OPERATIVE OPIOID TAPER INSTRUCTIONS: It is important to wean off of your opioid medication as soon as possible. If you do not need pain medication after your surgery  it is ok to stop day one. Opioids include: Codeine, Hydrocodone(Norco, Vicodin), Oxycodone(Percocet, oxycontin) and hydromorphone amongst others.  Long term and even short term use of opiods can cause: Increased pain response Dependence Constipation Depression Respiratory depression And more.  Withdrawal symptoms can include Flu like symptoms Nausea, vomiting And more Techniques to manage these symptoms Hydrate well Eat regular healthy meals Stay active Use relaxation techniques(deep breathing, meditating, yoga) Do Not substitute Alcohol to help with tapering If you have been on opioids for less than two weeks and do not have pain than it is ok to stop all together.  Plan to wean off of opioids This plan should start within one week post op of your joint replacement. Maintain the same interval or time between taking each dose and first decrease the dose.  Cut the total daily intake of opioids by one tablet each day Next start to increase the time between doses. The last dose that should be eliminated is the  evening dose.      TED hose   Complete by: As directed    Use stockings (TED hose) for 4 weeks on both leg(s).  You may remove them at night for sleeping.     Allergies as of 11/12/2020      Reactions   Codeine Other (See Comments)   Out of right state of mind.    Meloxicam Itching      Medication List    STOP taking these medications   acetaminophen 325 MG tablet Commonly known as: TYLENOL   predniSONE 10 MG (21) Tbpk tablet Commonly known as: STERAPRED UNI-PAK 21 TAB     TAKE these medications   aspirin 81 MG chewable tablet Chew 1 tablet (81 mg total) by mouth 2 (two) times daily.   atorvastatin 40 MG tablet Commonly known as: LIPITOR Take 40 mg by mouth at bedtime.   docusate sodium 100 MG capsule Commonly known as: COLACE Take 1 capsule (100 mg total) by mouth 2 (two) times daily.   escitalopram 20 MG tablet Commonly known as: LEXAPRO Take 20 mg by mouth every evening.   HYDROcodone-acetaminophen 5-325 MG tablet Commonly known as: NORCO/VICODIN Take 1 tablet by mouth every 6 (six) hours as needed for up to 7 days for moderate pain.   ibuprofen 800 MG tablet Commonly known as: ADVIL Take 800 mg by mouth daily.   methocarbamol 500 MG tablet Commonly known as: ROBAXIN Take 1 tablet (500 mg total) by mouth every 6 (six) hours as needed for muscle spasms.   traMADol 50 MG tablet Commonly known as: ULTRAM Take 1 tablet (50 mg total) by mouth every 6 (six) hours as needed for up to 7 days.            Discharge Care Instructions  (From admission, onward)         Start     Ordered   11/12/20 0000  Change dressing       Comments: Do not Change dressing   11/12/20 0737          Follow-up Information    Carole Civil, MD Follow up on 11/26/2020.   Specialties: Orthopedic Surgery, Radiology Contact information: [address] [place] [address] [phone]               Signed: Arther Abbott 11/12/2020, 7:38 AM

## 2020-11-13 DIAGNOSIS — Z471 Aftercare following joint replacement surgery: Secondary | ICD-10-CM | POA: Diagnosis not present

## 2020-11-13 DIAGNOSIS — G4730 Sleep apnea, unspecified: Secondary | ICD-10-CM | POA: Diagnosis not present

## 2020-11-13 DIAGNOSIS — Z791 Long term (current) use of non-steroidal anti-inflammatories (NSAID): Secondary | ICD-10-CM | POA: Diagnosis not present

## 2020-11-13 DIAGNOSIS — M1711 Unilateral primary osteoarthritis, right knee: Secondary | ICD-10-CM | POA: Diagnosis not present

## 2020-11-13 DIAGNOSIS — E7800 Pure hypercholesterolemia, unspecified: Secondary | ICD-10-CM | POA: Diagnosis not present

## 2020-11-13 DIAGNOSIS — Z96652 Presence of left artificial knee joint: Secondary | ICD-10-CM | POA: Diagnosis not present

## 2020-11-13 DIAGNOSIS — Z87891 Personal history of nicotine dependence: Secondary | ICD-10-CM | POA: Diagnosis not present

## 2020-11-13 DIAGNOSIS — F32A Depression, unspecified: Secondary | ICD-10-CM | POA: Diagnosis not present

## 2020-11-13 DIAGNOSIS — Z981 Arthrodesis status: Secondary | ICD-10-CM | POA: Diagnosis not present

## 2020-11-13 DIAGNOSIS — Z7982 Long term (current) use of aspirin: Secondary | ICD-10-CM | POA: Diagnosis not present

## 2020-11-13 DIAGNOSIS — K219 Gastro-esophageal reflux disease without esophagitis: Secondary | ICD-10-CM | POA: Diagnosis not present

## 2020-11-14 ENCOUNTER — Telehealth: Payer: Self-pay | Admitting: Orthopedic Surgery

## 2020-11-14 NOTE — Telephone Encounter (Signed)
Call from [facility] Patient for verbal orders for 2 weeks, 1; for 3 weeks, 1. Please call ph# [phone].

## 2020-11-15 DIAGNOSIS — F32A Depression, unspecified: Secondary | ICD-10-CM | POA: Diagnosis not present

## 2020-11-15 DIAGNOSIS — Z791 Long term (current) use of non-steroidal anti-inflammatories (NSAID): Secondary | ICD-10-CM | POA: Diagnosis not present

## 2020-11-15 DIAGNOSIS — Z981 Arthrodesis status: Secondary | ICD-10-CM | POA: Diagnosis not present

## 2020-11-15 DIAGNOSIS — Z471 Aftercare following joint replacement surgery: Secondary | ICD-10-CM | POA: Diagnosis not present

## 2020-11-15 DIAGNOSIS — M1711 Unilateral primary osteoarthritis, right knee: Secondary | ICD-10-CM | POA: Diagnosis not present

## 2020-11-15 DIAGNOSIS — Z96652 Presence of left artificial knee joint: Secondary | ICD-10-CM | POA: Diagnosis not present

## 2020-11-15 DIAGNOSIS — Z7982 Long term (current) use of aspirin: Secondary | ICD-10-CM | POA: Diagnosis not present

## 2020-11-15 DIAGNOSIS — E7800 Pure hypercholesterolemia, unspecified: Secondary | ICD-10-CM | POA: Diagnosis not present

## 2020-11-15 DIAGNOSIS — G4730 Sleep apnea, unspecified: Secondary | ICD-10-CM | POA: Diagnosis not present

## 2020-11-15 DIAGNOSIS — Z87891 Personal history of nicotine dependence: Secondary | ICD-10-CM | POA: Diagnosis not present

## 2020-11-15 DIAGNOSIS — K219 Gastro-esophageal reflux disease without esophagitis: Secondary | ICD-10-CM | POA: Diagnosis not present

## 2020-11-17 DIAGNOSIS — Z791 Long term (current) use of non-steroidal anti-inflammatories (NSAID): Secondary | ICD-10-CM | POA: Diagnosis not present

## 2020-11-17 DIAGNOSIS — Z7982 Long term (current) use of aspirin: Secondary | ICD-10-CM | POA: Diagnosis not present

## 2020-11-17 DIAGNOSIS — E7800 Pure hypercholesterolemia, unspecified: Secondary | ICD-10-CM | POA: Diagnosis not present

## 2020-11-17 DIAGNOSIS — K219 Gastro-esophageal reflux disease without esophagitis: Secondary | ICD-10-CM | POA: Diagnosis not present

## 2020-11-17 DIAGNOSIS — Z96652 Presence of left artificial knee joint: Secondary | ICD-10-CM | POA: Diagnosis not present

## 2020-11-17 DIAGNOSIS — Z471 Aftercare following joint replacement surgery: Secondary | ICD-10-CM | POA: Diagnosis not present

## 2020-11-17 DIAGNOSIS — Z981 Arthrodesis status: Secondary | ICD-10-CM | POA: Diagnosis not present

## 2020-11-17 DIAGNOSIS — G4730 Sleep apnea, unspecified: Secondary | ICD-10-CM | POA: Diagnosis not present

## 2020-11-17 DIAGNOSIS — F32A Depression, unspecified: Secondary | ICD-10-CM | POA: Diagnosis not present

## 2020-11-17 DIAGNOSIS — Z87891 Personal history of nicotine dependence: Secondary | ICD-10-CM | POA: Diagnosis not present

## 2020-11-17 DIAGNOSIS — M1711 Unilateral primary osteoarthritis, right knee: Secondary | ICD-10-CM | POA: Diagnosis not present

## 2020-11-17 NOTE — Telephone Encounter (Signed)
Called with Verbal orders.

## 2020-11-19 DIAGNOSIS — Z791 Long term (current) use of non-steroidal anti-inflammatories (NSAID): Secondary | ICD-10-CM | POA: Diagnosis not present

## 2020-11-19 DIAGNOSIS — Z981 Arthrodesis status: Secondary | ICD-10-CM | POA: Diagnosis not present

## 2020-11-19 DIAGNOSIS — E7800 Pure hypercholesterolemia, unspecified: Secondary | ICD-10-CM | POA: Diagnosis not present

## 2020-11-19 DIAGNOSIS — M1711 Unilateral primary osteoarthritis, right knee: Secondary | ICD-10-CM | POA: Diagnosis not present

## 2020-11-19 DIAGNOSIS — G4730 Sleep apnea, unspecified: Secondary | ICD-10-CM | POA: Diagnosis not present

## 2020-11-19 DIAGNOSIS — F32A Depression, unspecified: Secondary | ICD-10-CM | POA: Diagnosis not present

## 2020-11-19 DIAGNOSIS — Z471 Aftercare following joint replacement surgery: Secondary | ICD-10-CM | POA: Diagnosis not present

## 2020-11-19 DIAGNOSIS — K219 Gastro-esophageal reflux disease without esophagitis: Secondary | ICD-10-CM | POA: Diagnosis not present

## 2020-11-19 DIAGNOSIS — Z7982 Long term (current) use of aspirin: Secondary | ICD-10-CM | POA: Diagnosis not present

## 2020-11-19 DIAGNOSIS — Z96652 Presence of left artificial knee joint: Secondary | ICD-10-CM | POA: Diagnosis not present

## 2020-11-19 DIAGNOSIS — Z87891 Personal history of nicotine dependence: Secondary | ICD-10-CM | POA: Diagnosis not present

## 2020-11-22 ENCOUNTER — Other Ambulatory Visit: Payer: Self-pay | Admitting: Orthopedic Surgery

## 2020-11-22 DIAGNOSIS — Z96652 Presence of left artificial knee joint: Secondary | ICD-10-CM | POA: Diagnosis not present

## 2020-11-22 DIAGNOSIS — M1711 Unilateral primary osteoarthritis, right knee: Secondary | ICD-10-CM | POA: Diagnosis not present

## 2020-11-22 DIAGNOSIS — K219 Gastro-esophageal reflux disease without esophagitis: Secondary | ICD-10-CM | POA: Diagnosis not present

## 2020-11-22 DIAGNOSIS — Z7982 Long term (current) use of aspirin: Secondary | ICD-10-CM | POA: Diagnosis not present

## 2020-11-22 DIAGNOSIS — Z87891 Personal history of nicotine dependence: Secondary | ICD-10-CM | POA: Diagnosis not present

## 2020-11-22 DIAGNOSIS — Z981 Arthrodesis status: Secondary | ICD-10-CM | POA: Diagnosis not present

## 2020-11-22 DIAGNOSIS — E7800 Pure hypercholesterolemia, unspecified: Secondary | ICD-10-CM | POA: Diagnosis not present

## 2020-11-22 DIAGNOSIS — G4730 Sleep apnea, unspecified: Secondary | ICD-10-CM | POA: Diagnosis not present

## 2020-11-22 DIAGNOSIS — Z791 Long term (current) use of non-steroidal anti-inflammatories (NSAID): Secondary | ICD-10-CM | POA: Diagnosis not present

## 2020-11-22 DIAGNOSIS — Z471 Aftercare following joint replacement surgery: Secondary | ICD-10-CM | POA: Diagnosis not present

## 2020-11-22 DIAGNOSIS — F32A Depression, unspecified: Secondary | ICD-10-CM | POA: Diagnosis not present

## 2020-11-24 ENCOUNTER — Encounter (HOSPITAL_COMMUNITY): Payer: Self-pay

## 2020-11-24 ENCOUNTER — Other Ambulatory Visit: Payer: Self-pay

## 2020-11-24 ENCOUNTER — Ambulatory Visit (HOSPITAL_COMMUNITY): Payer: Medicare Other | Attending: Orthopedic Surgery

## 2020-11-24 DIAGNOSIS — M6281 Muscle weakness (generalized): Secondary | ICD-10-CM | POA: Diagnosis not present

## 2020-11-24 DIAGNOSIS — E785 Hyperlipidemia, unspecified: Secondary | ICD-10-CM | POA: Diagnosis not present

## 2020-11-24 DIAGNOSIS — R262 Difficulty in walking, not elsewhere classified: Secondary | ICD-10-CM | POA: Insufficient documentation

## 2020-11-24 DIAGNOSIS — M109 Gout, unspecified: Secondary | ICD-10-CM | POA: Diagnosis not present

## 2020-11-24 DIAGNOSIS — R2689 Other abnormalities of gait and mobility: Secondary | ICD-10-CM | POA: Diagnosis not present

## 2020-11-24 DIAGNOSIS — Z79899 Other long term (current) drug therapy: Secondary | ICD-10-CM | POA: Diagnosis not present

## 2020-11-24 NOTE — Therapy (Signed)
[facility] [address] [place], [place], [address] Phone: [phone]   Fax:  [phone]  Physical Therapy Evaluation  Patient Details  Name: [name] MRN: [id number] Date of Birth: Dec 13, 1955 Referring Provider (PT): Arther Abbott, MD   Encounter Date: 11/24/2020   PT End of Session - 11/24/20 0941    Visit Number 1    Number of Visits 18    Date for PT Re-Evaluation 01/05/21    Authorization Type UHC Medicare, no auth, no VL    PT Start Time 0945    PT Stop Time 1030    PT Time Calculation (min) 45 min    Activity Tolerance Patient tolerated treatment well    Behavior During Therapy [facility] for tasks assessed/performed           Past Medical History:  Diagnosis Date  . Anxiety    some panic attacks- off medications  . Arthritis   . Complication of anesthesia    woke up during surgery- Elbow surgery, 2009 and again with foot surgeryry   . Depression   . GERD (gastroesophageal reflux disease)    once in awhile  . Hypercholesteremia   . Sleep apnea    has lost 'abunch of weight' its been yrs since i used it    Past Surgical History:  Procedure Laterality Date  . ANTERIOR CERVICAL DECOMP/DISCECTOMY FUSION N/A 12/21/2017   Procedure: C3-4 ANTERIOR CERVICAL DECOMPRESSION/DISCECTOMY FUSION, ALLOGRAFT, PLATE;  Surgeon: Marybelle Killings, MD;  Location: [place];  Service: Orthopedics;  Laterality: N/A;  . CATARACT EXTRACTION Right   . CATARACT EXTRACTION Left   . COLONOSCOPY  2008   Dr. Oneida Alar: Hyperplastic polyp removed, diverticulosis.  . COLONOSCOPY WITH PROPOFOL N/A 07/11/2018   Procedure: COLONOSCOPY WITH PROPOFOL;  Surgeon: Danie Binder, MD;  [facility];  Service: Endoscopy;  Laterality: N/A;  7:30am  . FOOT SURGERY    . left elbow     . LUMBAR LAMINECTOMY     back in 2015  . MASS EXCISION Left 09/12/2019   Procedure: EXCISION GOUTY TOPHI;  Surgeon: Caprice Beaver, DPM;  [facility];  Service:  Podiatry;  Laterality: Left;  . POLYPECTOMY  07/11/2018   Procedure: POLYPECTOMY;  Surgeon: Danie Binder, MD;  [facility];  Service: Endoscopy;;  colon  . TOTAL KNEE ARTHROPLASTY Left 11/11/2020   Procedure: LEFT TOTAL KNEE ARTHROPLASTY;  Surgeon: Carole Civil, MD;  [facility];  Service: Orthopedics;  Laterality: Left;    There were no vitals filed for this visit.    Subjective Assessment - 11/24/20 0948    Subjective Left TKR on 11/11/20 and has been able to participate with HHPT x 4 visits.  Pt has been doing very well and using CPM and increasing his activity daily    Currently in Pain? Yes    Pain Score 2     Pain Location Knee    Pain Orientation Left    Pain Descriptors / Indicators Aching    Pain Type Surgical pain    Pain Onset 1 to 4 weeks ago    Pain Frequency Intermittent    Pain Relieving Factors ice, CPM, movement              [facility] PT Assessment - 11/24/20 0001      Assessment   Medical Diagnosis left TKR    Referring Provider (PT) Arther Abbott, MD    Onset Date/Surgical Date 11/11/20  Prior Therapy HHPT      Balance Screen   Has the patient fallen in the past 6 months No    Has the patient had a decrease in activity level because of a fear of falling?  No    Is the patient reluctant to leave their home because of a fear of falling?  No      Home Social worker Private residence    Living Arrangements Alone    Available Help at Discharge Friend(s)    Type of [place] to enter    Entrance Stairs-Number of Steps 4    Entrance Stairs-Rails Right;Left;Can reach both    [place] One level    [place] - 2 wheels;Bedside commode;Cane - single point      Prior Function   Level of Independence Independent    Vocation Retired    Leisure ride around      Observation/Other Assessments   Focus on Therapeutic Outcomes (FOTO)  69% function      ROM / Strength    AROM / PROM / Strength AROM;Strength      AROM   AROM Assessment Site Knee    Right/Left Knee Left    Left Knee Extension 5   lacking   Left Knee Flexion 102      Strength   Strength Assessment Site Knee    Right/Left Knee Left    Left Knee Flexion 3+/5    Left Knee Extension 3+/5      Ambulation/Gait   Ambulation/Gait Yes    Ambulation/Gait Assistance 6: Modified independent (Device/Increase time)    Ambulation Distance (Feet) 270 Feet    Assistive device Rolling walker    Gait Pattern Step-through pattern    Ambulation Surface Level;Indoor    Gait velocity decreased    Stairs Yes    Stairs Assistance 6: Modified independent (Device/Increase time)    Stair Management Technique Two rails;Step to pattern    Number of Stairs 8    Height of Stairs 6    Gait Comments 2MWT                      Objective measurements completed on examination: See above findings.       [facility] Adult PT Treatment/Exercise - 11/24/20 0001      Exercises   Exercises Knee/Hip      Knee/Hip Exercises: Stretches   Passive Hamstring Stretch Left;3 reps;30 seconds      Knee/Hip Exercises: Supine   Quad Sets Strengthening;Left;2 sets;10 reps    Heel Slides AAROM;Left;2 sets;10 reps                  PT Education - 11/24/20 1002    Education Details pt education in POC assessment and STG/LTG    Person(s) Educated Patient    Methods Explanation;Demonstration;Handout    Comprehension Verbalized understanding;Need further instruction            PT Short Term Goals - 11/24/20 1016      PT SHORT TERM GOAL #1   Title Patient will report at least 50% improvement in overall symptoms and function to demonstrate overall improved functional ability    Time 3    Period Weeks    Status New    Target Date 12/15/20      PT SHORT TERM GOAL #2   Title Patient will be independent with HEP in order to improve  functional outcomes.    Time 3    Period Weeks    Status New    Target  Date 12/15/20      PT SHORT TERM GOAL #3   Title Demo full left knee extension and 120 degrees flexion left knee to facilitate normalized gait mechanics and transfers    Baseline lacking 3-5 left knee extension, 102 degrees flexion    Time 3    Period Weeks    Status New    Target Date 12/15/20      PT SHORT TERM GOAL #4   Title Climb/descend 4 stairs with BHR and reciprocal pattern    Baseline BHR and step-to pattern    Time 3    Period Weeks    Status New    Target Date 12/15/20             PT Long Term Goals - 11/24/20 1020      PT LONG TERM GOAL #1   Title Patient will improve on FOTO score to meet predicted outcomes to improve functional independence    Baseline 69% function    Time 6    Period Weeks    Status New    Target Date 01/05/21      PT LONG TERM GOAL #2   Title Demo improved ambulation speed and quality as evidenced by distance of 450 ft during 2MWT using LRAD    Baseline 270 ft with RW and step-through pattern    Time 6    Period Weeks    Status New    Target Date 01/05/21                  Plan - 11/24/20 1007    Clinical Impression Statement Patient is a  65 yo male presenting to physical therapy with c/o left knee pain and dysfunction following TKR. He presents with pain limited deficits in left knee strength, ROM, endurance,and functional mobility with ADL. She is having to modify and restrict ADL as indicated by FOTO score as well as subjective information and objective measures which is affecting overall participation. Patient will benefit from skilled physical therapy in order to improve function and reduce impairment.    Personal Factors and Comorbidities Time since onset of injury/illness/exacerbation    Examination-Activity Limitations Bend;Carry;Lift;Toileting;Stand;Stairs;Squat;Reach Overhead;Locomotion Level;Transfers    Examination-Participation Restrictions Cleaning;Community Activity;Driving;Yard Work    Stability/Clinical  Decision Making Stable/Uncomplicated    Designer, jewellery Low    Rehab Potential Excellent    PT Frequency 3x / week    PT Duration 6 weeks    PT Treatment/Interventions ADLs/Self Care Home Management;Aquatic Therapy;Cryotherapy;Electrical Stimulation;DME Instruction;Gait training;Stair training;Functional mobility training;Therapeutic activities;Therapeutic exercise;Balance training;Patient/family education;Neuromuscular re-education;Passive range of motion;Dry needling;Energy conservation    PT Next Visit Plan Continue with TKR protocol    PT Home Exercise Plan QS, heel slides, hamstring stretch, knee flexion drive on step    Consulted and Agree with Plan of Care Patient           Patient will benefit from skilled therapeutic intervention in order to improve the following deficits and impairments:  Abnormal gait,Decreased activity tolerance,Decreased balance,Decreased mobility,Decreased knowledge of use of DME,Decreased endurance,Decreased range of motion,Decreased strength,Increased edema,Difficulty walking,Improper body mechanics,Pain  Visit Diagnosis: Difficulty in walking, not elsewhere classified  Muscle weakness (generalized)  Other abnormalities of gait and mobility     Problem List Patient Active Problem List   Diagnosis Date Noted  . Primary localized osteoarthritis of knee 11/11/2020  . Primary osteoarthritis  of left knee   . Encounter for screening colonoscopy 05/09/2018  . Polyphagia 05/09/2018  . Complication of anesthesia 05/09/2018  . Superior labrum anterior-to-posterior (SLAP) tear of right shoulder 03/29/2018  . History of fusion of cervical spine 12/28/2017  . Status post lumbar spinal fusion 11/15/2017  . RESTLESS LEGS SYNDROME 08/02/2009  . HYPERLIPIDEMIA 07/23/2009  . OBSTRUCTIVE SLEEP APNEA 07/23/2009   10:23 AM, 11/24/20 M. Sherlyn Lees, PT, DPT Physical Therapist- [place] Office Number: [phone]  [facility] [address] [place], [place], [address] Phone: [phone]   Fax:  [phone]  Name: [name] MRN: [id number] Date of Birth: 1956-04-16

## 2020-11-25 DIAGNOSIS — Z96652 Presence of left artificial knee joint: Secondary | ICD-10-CM | POA: Insufficient documentation

## 2020-11-26 ENCOUNTER — Ambulatory Visit (INDEPENDENT_AMBULATORY_CARE_PROVIDER_SITE_OTHER): Payer: Medicare Other | Admitting: Orthopedic Surgery

## 2020-11-26 ENCOUNTER — Encounter: Payer: Self-pay | Admitting: Orthopedic Surgery

## 2020-11-26 ENCOUNTER — Ambulatory Visit (HOSPITAL_COMMUNITY): Payer: Medicare Other

## 2020-11-26 ENCOUNTER — Other Ambulatory Visit: Payer: Self-pay

## 2020-11-26 ENCOUNTER — Encounter (HOSPITAL_COMMUNITY): Payer: Self-pay

## 2020-11-26 DIAGNOSIS — R2689 Other abnormalities of gait and mobility: Secondary | ICD-10-CM | POA: Diagnosis not present

## 2020-11-26 DIAGNOSIS — Z96652 Presence of left artificial knee joint: Secondary | ICD-10-CM

## 2020-11-26 DIAGNOSIS — R262 Difficulty in walking, not elsewhere classified: Secondary | ICD-10-CM | POA: Diagnosis not present

## 2020-11-26 DIAGNOSIS — M6281 Muscle weakness (generalized): Secondary | ICD-10-CM

## 2020-11-26 MED ORDER — HYDROCODONE-ACETAMINOPHEN 5-325 MG PO TABS: 1.0000 | ORAL_TABLET | Freq: Three times a day (TID) | ORAL | 0 refills | Status: AC | PRN

## 2020-11-26 NOTE — Therapy (Signed)
[facility] [place], [place], [address] Phone: [phone]   Fax:  [phone]  Physical Therapy Treatment  Patient Details  Name: [name] MRN: [id number] Date of Birth: 11-25-1955 Referring Provider (PT): Arther Abbott, MD   Encounter Date: 11/26/2020   PT End of Session - 11/26/20 1141    Visit Number 2    Number of Visits 18    Date for PT Re-Evaluation 01/05/21    Authorization Type UHC Medicare, no auth, no VL    Progress Note Due on Visit 10    PT Start Time 1136    PT Stop Time 1215    PT Time Calculation (min) 39 min    Activity Tolerance Patient tolerated treatment well    Behavior During Therapy [facility] for tasks assessed/performed           Past Medical History:  Diagnosis Date  . Anxiety    some panic attacks- off medications  . Arthritis   . Complication of anesthesia    woke up during surgery- Elbow surgery, 2009 and again with foot surgeryry   . Depression   . GERD (gastroesophageal reflux disease)    once in awhile  . Hypercholesteremia   . Sleep apnea    has lost 'abunch of weight' its been yrs since i used it    Past Surgical History:  Procedure Laterality Date  . ANTERIOR CERVICAL DECOMP/DISCECTOMY FUSION N/A 12/21/2017   Procedure: C3-4 ANTERIOR CERVICAL DECOMPRESSION/DISCECTOMY FUSION, ALLOGRAFT, PLATE;  Surgeon: Marybelle Killings, MD;  Location: [facility];  Service: Orthopedics;  Laterality: N/A;  . CATARACT EXTRACTION Right   . CATARACT EXTRACTION Left   . COLONOSCOPY  2008   Dr. Oneida Alar: Hyperplastic polyp removed, diverticulosis.  . COLONOSCOPY WITH PROPOFOL N/A 07/11/2018   Procedure: COLONOSCOPY WITH PROPOFOL;  Surgeon: Danie Binder, MD;  [facility];  Service: Endoscopy;  Laterality: N/A;  7:30am  . FOOT SURGERY    . left elbow     . LUMBAR LAMINECTOMY     back in 2015  . MASS EXCISION Left 09/12/2019   Procedure: EXCISION GOUTY TOPHI;  Surgeon: Caprice Beaver,  DPM;  [facility];  Service: Podiatry;  Laterality: Left;  . POLYPECTOMY  07/11/2018   Procedure: POLYPECTOMY;  Surgeon: Danie Binder, MD;  [facility];  Service: Endoscopy;;  colon  . TOTAL KNEE ARTHROPLASTY Left 11/11/2020   Procedure: LEFT TOTAL KNEE ARTHROPLASTY;  Surgeon: Carole Civil, MD;  [facility];  Service: Orthopedics;  Laterality: Left;    There were no vitals filed for this visit.   Subjective Assessment - 11/26/20 1139    Subjective Went to MD earlier had staples removed, reports MD happy with progress.  Knee is sore right now, 4/10.    Currently in Pain? Yes    Pain Score 4     Pain Location Knee    Pain Orientation Left    Pain Descriptors / Indicators Sore;Tightness    Pain Type Surgical pain    Pain Onset 1 to 4 weeks ago    Pain Frequency Intermittent    Aggravating Factors  standing for prolonged period of time    Pain Relieving Factors ice, CPM, movement, elevation              [facility] PT Assessment - 11/26/20 0001      Assessment   Medical Diagnosis left TKR    Referring Provider (PT) Arther Abbott,  MD    Onset Date/Surgical Date 11/11/20    Hand Dominance Right    Next MD Visit 12/18/20    Prior Therapy HHPT                         [facility] Adult PT Treatment/Exercise - 11/26/20 0001      Knee/Hip Exercises: Stretches   Passive Hamstring Stretch Left;3 reps;30 seconds    Passive Hamstring Stretch Limitations hands behind knee      Knee/Hip Exercises: Aerobic   Stationary Bike seat 16 initial rocking then full revolution x 3 min      Knee/Hip Exercises: Standing   Heel Raises 10 reps    Rocker Board 2 minutes    Rocker Board Limitations lateral      Knee/Hip Exercises: Seated   Long Arc Quad 10 reps    Long Arc Quad Limitations 5" holds    Sit to [employer] 10 reps;without UE support   22in height     Knee/Hip Exercises: Supine   Quad Sets Strengthening;Left;2 sets;10 reps    [employer] Limitations 5"  holds    Short Arc [employer] Left;10 reps    Short Arc [employer] Limitations 5" holds    Heel Slides AROM;10 reps    [place] Limitations next session    Straight Leg Raises 10 reps    Knee Extension AROM    Knee Extension Limitations 3 degrees lacking    Knee Flexion AROM    Knee Flexion Limitations 110 degrees                  PT Education - 11/26/20 1152    Education Details Reviewed goals with pt and wife present this session, educated importance of HEP compliance for maximal benefits, pt able to recall and demonstrate appropriate mechanics, cueing to increased isometric hold time with quad sets for strengthening.  Reviewed RICE techniques for pain and edema control.    Person(s) Educated Patient    Methods Explanation;Demonstration;Verbal cues    Comprehension Verbalized understanding;Returned demonstration            PT Short Term Goals - 11/24/20 1016      PT SHORT TERM GOAL #1   Title Patient will report at least 50% improvement in overall symptoms and function to demonstrate overall improved functional ability    Time 3    Period Weeks    Status New    Target Date 12/15/20      PT SHORT TERM GOAL #2   Title Patient will be independent with HEP in order to improve functional outcomes.    Time 3    Period Weeks    Status New    Target Date 12/15/20      PT SHORT TERM GOAL #3   Title Demo full left knee extension and 120 degrees flexion left knee to facilitate normalized gait mechanics and transfers    Baseline lacking 3-5 left knee extension, 102 degrees flexion    Time 3    Period Weeks    Status New    Target Date 12/15/20      PT SHORT TERM GOAL #4   Title Climb/descend 4 stairs with BHR and reciprocal pattern    Baseline BHR and step-to pattern    Time 3    Period Weeks    Status New    Target Date 12/15/20             PT Long  Term Goals - 11/24/20 1020      PT LONG TERM GOAL #1   Title Patient will improve on FOTO score to meet  predicted outcomes to improve functional independence    Baseline 69% function    Time 6    Period Weeks    Status New    Target Date 01/05/21      PT LONG TERM GOAL #2   Title Demo improved ambulation speed and quality as evidenced by distance of 450 ft during 2MWT using LRAD    Baseline 270 ft with RW and step-through pattern    Time 6    Period Weeks    Status New    Target Date 01/05/21                 Plan - 11/26/20 1328    Clinical Impression Statement Reviewed goals, educated importance of HEP compliance for maximal benefits with therapy.  Pt able to recall and demonstrate appropraite mechanics with current exercise program.  Session focus on ROM and strengthening.  Pt progressing well wtih improved AROM 3-110 degrees.  Cueing to improve holds times for isometric quad sets for strengthening and to continue breathing with all exercises.  No reports of increased pain throught session.    Personal Factors and Comorbidities Time since onset of injury/illness/exacerbation    Examination-Activity Limitations Bend;Carry;Lift;Toileting;Stand;Stairs;Squat;Reach Overhead;Locomotion Level;Transfers    Examination-Participation Restrictions Cleaning;Community Activity;Driving;Yard Work    Stability/Clinical Decision Making Stable/Uncomplicated    Designer, jewellery Low    Rehab Potential Excellent    PT Frequency 3x / week    PT Duration 6 weeks    PT Treatment/Interventions ADLs/Self Care Home Management;Aquatic Therapy;Cryotherapy;Electrical Stimulation;DME Instruction;Gait training;Stair training;Functional mobility training;Therapeutic activities;Therapeutic exercise;Balance training;Patient/family education;Neuromuscular re-education;Passive range of motion;Dry needling;Energy conservation    PT Next Visit Plan Progress to gait training wiht SPC.   TKE based exercises.  Continue ROM and strengthening exercises.    PT Home Exercise Plan QS, heel slides, hamstring stretch,  knee flexion drive on step    Consulted and Agree with Plan of Care Patient;Family member/caregiver    Family Member Consulted wife           Patient will benefit from skilled therapeutic intervention in order to improve the following deficits and impairments:  Abnormal gait,Decreased activity tolerance,Decreased balance,Decreased mobility,Decreased knowledge of use of DME,Decreased endurance,Decreased range of motion,Decreased strength,Increased edema,Difficulty walking,Improper body mechanics,Pain  Visit Diagnosis: Difficulty in walking, not elsewhere classified  Muscle weakness (generalized)  Other abnormalities of gait and mobility     Problem List Patient Active Problem List   Diagnosis Date Noted  . S/P total knee replacement, left 11/11/20 11/25/2020  . Primary localized osteoarthritis of knee 11/11/2020  . Primary osteoarthritis of left knee   . Encounter for screening colonoscopy 05/09/2018  . Polyphagia 05/09/2018  . Complication of anesthesia 05/09/2018  . Superior labrum anterior-to-posterior (SLAP) tear of right shoulder 03/29/2018  . History of fusion of cervical spine 12/28/2017  . Status post lumbar spinal fusion 11/15/2017  . RESTLESS LEGS SYNDROME 08/02/2009  . HYPERLIPIDEMIA 07/23/2009  . OBSTRUCTIVE SLEEP APNEA 07/23/2009   Ihor Austin, LPTA/CLT; CBIS [phone]  Aldona Lento 11/26/2020, 1:35 PM  [facility] [place], [place], [address] Phone: [phone]   Fax:  [phone]  Name: [name] MRN: [id number] Date of Birth: 1956/02/25

## 2020-11-26 NOTE — Progress Notes (Signed)
Post oop   Chief Complaint  Patient presents with  . Post-op Follow-up    Left knee follow up s/p total knee replacement 11/11/20 has started out patient therapy    Admit date: 11/11/2020 Discharge date: 11/12/2020  Admission Diagnoses: OSTEOARTHRITIS LEFT KNEE   Discharge Diagnoses: OSTEOARTHRITIS LEFT KNEE   Discharged Condition: good  Procedure: TKA LEFT KNEE ATTUNE PS FB 54F  7T  5 POLY [place] [facility] Course: [place]   Jabri is doing well he is just having a little trouble with his extension  Staples were removed his wound looks good he has some knee swelling expected no ankle edema he is moving well waiting to drive  He has his outpatient therapy  We discussed his opioids he is using it appropriately and we talked about tapering by 6 weeks using ibuprofen and tramadol  Meds ordered this encounter  Medications  . HYDROcodone-acetaminophen (NORCO/VICODIN) 5-325 MG tablet    Sig: Take 1 tablet by mouth every 8 (eight) hours as needed for up to 7 days for moderate pain.    Dispense:  21 tablet    Refill:  0    Follow-up in 3 weeks

## 2020-11-26 NOTE — Patient Instructions (Signed)
You can remove the stockings but continue the aspirin for 2 weeks  Continue your therapy sessions  Work on your knee extension straightening your leg  It is okay to drive  Remember the hydrocodone will have to be tapered off by 6 weeks so use her ibuprofen and tramadol and use the hydrocodone for supplement especially if you are having pain with physical therapy or at night

## 2020-11-28 ENCOUNTER — Encounter (HOSPITAL_COMMUNITY): Payer: Self-pay

## 2020-11-28 ENCOUNTER — Other Ambulatory Visit: Payer: Self-pay

## 2020-11-28 ENCOUNTER — Ambulatory Visit (HOSPITAL_COMMUNITY): Payer: Medicare Other

## 2020-11-28 DIAGNOSIS — M6281 Muscle weakness (generalized): Secondary | ICD-10-CM | POA: Diagnosis not present

## 2020-11-28 DIAGNOSIS — R2689 Other abnormalities of gait and mobility: Secondary | ICD-10-CM | POA: Diagnosis not present

## 2020-11-28 DIAGNOSIS — R262 Difficulty in walking, not elsewhere classified: Secondary | ICD-10-CM | POA: Diagnosis not present

## 2020-11-28 NOTE — Therapy (Signed)
[place], [place], [address] Phone: [phone]   Fax:  [phone]  Physical Therapy Treatment  Patient Details  Name: [name] MRN: [id number] Date of Birth: June 20, 1956 Referring Provider (PT): Arther Abbott, MD   Encounter Date: 11/28/2020   PT End of Session - 11/28/20 1352    Visit Number 3    Number of Visits 18    Date for PT Re-Evaluation 01/05/21    Authorization Type UHC Medicare, no auth, no VL    Progress Note Due on Visit 10    PT Start Time 1345    PT Stop Time 1430    PT Time Calculation (min) 45 min    Activity Tolerance Patient tolerated treatment well    Behavior During Therapy [facility] for tasks assessed/performed           Past Medical History:  Diagnosis Date  . Anxiety    some panic attacks- off medications  . Arthritis   . Complication of anesthesia    woke up during surgery- Elbow surgery, 2009 and again with foot surgeryry   . Depression   . GERD (gastroesophageal reflux disease)    once in awhile  . Hypercholesteremia   . Sleep apnea    has lost 'abunch of weight' its been yrs since i used it    Past Surgical History:  Procedure Laterality Date  . ANTERIOR CERVICAL DECOMP/DISCECTOMY FUSION N/A 12/21/2017   Procedure: C3-4 ANTERIOR CERVICAL DECOMPRESSION/DISCECTOMY FUSION, ALLOGRAFT, PLATE;  Surgeon: Marybelle Killings, MD;  Location: [place];  Service: Orthopedics;  Laterality: N/A;  . CATARACT EXTRACTION Right   . CATARACT EXTRACTION Left   . COLONOSCOPY  2008   Dr. Oneida Alar: Hyperplastic polyp removed, diverticulosis.  . COLONOSCOPY WITH PROPOFOL N/A 07/11/2018   Procedure: COLONOSCOPY WITH PROPOFOL;  Surgeon: Danie Binder, MD;  [facility];  Service: Endoscopy;  Laterality: N/A;  7:30am  . FOOT SURGERY    . left elbow     . LUMBAR LAMINECTOMY     back in 2015  . MASS EXCISION Left 09/12/2019   Procedure: EXCISION GOUTY TOPHI;  Surgeon: Caprice Beaver,  DPM;  [facility]: [facility];  Service: Podiatry;  Laterality: Left;  . POLYPECTOMY  07/11/2018   Procedure: POLYPECTOMY;  Surgeon: Danie Binder, MD;  [facility];  Service: Endoscopy;;  colon  . TOTAL KNEE ARTHROPLASTY Left 11/11/2020   Procedure: LEFT TOTAL KNEE ARTHROPLASTY;  Surgeon: Carole Civil, MD;  [facility];  Service: Orthopedics;  Laterality: Left;    There were no vitals filed for this visit.   Subjective Assessment - 11/28/20 1351    Subjective Feeling pretty good and using CPM for 115 degrees left knee flexion    Currently in Pain? Yes    Pain Score 5     Pain Location Knee    Pain Orientation Left    Pain Descriptors / Indicators Sore    Pain Type Surgical pain    Pain Onset 1 to 4 weeks ago              [facility] PT Assessment - 11/28/20 0001      Assessment   Medical Diagnosis left TKR    Referring Provider (PT) Arther Abbott, MD    Onset Date/Surgical Date 11/11/20    Hand Dominance Right    Next MD Visit 12/18/20    Prior Therapy [facility]  [place] Adult PT Treatment/Exercise - 11/28/20 0001      Ambulation/Gait   Ambulation/Gait Yes    Ambulation/Gait Assistance 6: Modified independent (Device/Increase time)    Ambulation Distance (Feet) 150 Feet    Assistive device Straight cane    Gait Pattern Step-through pattern      Knee/Hip Exercises: Stretches   Active Hamstring Stretch Left;5 reps   5 sec   Gastroc Stretch Both;3 reps;60 seconds    Gastroc Stretch Limitations slantboard      Knee/Hip Exercises: Aerobic   Nustep level 5 x 6 min, seat level 9 for dynamic warm-up      Knee/Hip Exercises: Machines for Strengthening   Cybex Knee Flexion 5.5 plates, 3x10    Total Gym Leg Press 34 degrees, 3x10      Knee/Hip Exercises: Standing   Heel Raises Both;3 sets;10 reps    Knee Flexion Strengthening;Left;3 sets;10 reps    Knee Flexion Limitations 3    Lunge Walking - Round Trips lunge stretch on  12" step 20x    Other Standing Knee Exercises stair taps x 2 min 3 lbs ankle weights    Other Standing Knee Exercises sidestepping x 2 min 3 lbs ankle weights      Knee/Hip Exercises: Seated   Long Arc Quad Strengthening;Left;3 sets;10 reps    Long Arc Quad Weight 6 lbs.      Knee/Hip Exercises: Supine   Quad Sets Strengthening;Left;2 sets;10 reps    Knee Extension AROM    Knee Extension Limitations 3 degrees lacking    Knee Flexion AROM    Knee Flexion Limitations 110 degrees                    PT Short Term Goals - 11/24/20 1016      PT SHORT TERM GOAL #1   Title Patient will report at least 50% improvement in overall symptoms and function to demonstrate overall improved functional ability    Time 3    Period Weeks    Status New    Target Date 12/15/20      PT SHORT TERM GOAL #2   Title Patient will be independent with HEP in order to improve functional outcomes.    Time 3    Period Weeks    Status New    Target Date 12/15/20      PT SHORT TERM GOAL #3   Title Demo full left knee extension and 120 degrees flexion left knee to facilitate normalized gait mechanics and transfers    Baseline lacking 3-5 left knee extension, 102 degrees flexion    Time 3    Period Weeks    Status New    Target Date 12/15/20      PT SHORT TERM GOAL #4   Title Climb/descend 4 stairs with BHR and reciprocal pattern    Baseline BHR and step-to pattern    Time 3    Period Weeks    Status New    Target Date 12/15/20             PT Long Term Goals - 11/24/20 1020      PT LONG TERM GOAL #1   Title Patient will improve on FOTO score to meet predicted outcomes to improve functional independence    Baseline 69% function    Time 6    Period Weeks    Status New    Target Date 01/05/21      PT LONG TERM GOAL #2  Title Demo improved ambulation speed and quality as evidenced by distance of 450 ft during 2MWT using LRAD    Baseline 270 ft with RW and step-through pattern     Time 6    Period Weeks    Status New    Target Date 01/05/21                 Plan - 11/28/20 1422    Clinical Impression Statement Progressing well with POC details and demonstrating greater activity tolerance and good return demonstration with cane for ambulation.  Continues to lack some terminal knee extension but with a notable soft end-feel with overpressure able to achieve full extension.  Continued POC indicated to improve LE strength, ROM, and progress to ambulation without AD    Personal Factors and Comorbidities Time since onset of injury/illness/exacerbation    Examination-Activity Limitations Bend;Carry;Lift;Toileting;Stand;Stairs;Squat;Reach Overhead;Locomotion Level;Transfers    Examination-Participation Restrictions Cleaning;Community Activity;Driving;Yard Work    Stability/Clinical Decision Making Stable/Uncomplicated    Rehab Potential Excellent    PT Frequency 3x / week    PT Duration 6 weeks    PT Treatment/Interventions ADLs/Self Care Home Management;Aquatic Therapy;Cryotherapy;Electrical Stimulation;DME Instruction;Gait training;Stair training;Functional mobility training;Therapeutic activities;Therapeutic exercise;Balance training;Patient/family education;Neuromuscular re-education;Passive range of motion;Dry needling;Energy conservation    PT Next Visit Plan Progress to gait training wiht SPC.   TKE based exercises.  Continue ROM and strengthening exercises.    PT Home Exercise Plan QS, heel slides, hamstring stretch, knee flexion drive on step    Consulted and Agree with Plan of Care Patient;Family member/caregiver    Family Member Consulted wife           Patient will benefit from skilled therapeutic intervention in order to improve the following deficits and impairments:  Abnormal gait,Decreased activity tolerance,Decreased balance,Decreased mobility,Decreased knowledge of use of DME,Decreased endurance,Decreased range of motion,Decreased strength,Increased  edema,Difficulty walking,Improper body mechanics,Pain  Visit Diagnosis: Difficulty in walking, not elsewhere classified  Muscle weakness (generalized)  Other abnormalities of gait and mobility     Problem List Patient Active Problem List   Diagnosis Date Noted  . S/P total knee replacement, left 11/11/20 11/25/2020  . Primary localized osteoarthritis of knee 11/11/2020  . Primary osteoarthritis of left knee   . Encounter for screening colonoscopy 05/09/2018  . Polyphagia 05/09/2018  . Complication of anesthesia 05/09/2018  . Superior labrum anterior-to-posterior (SLAP) tear of right shoulder 03/29/2018  . History of fusion of cervical spine 12/28/2017  . Status post lumbar spinal fusion 11/15/2017  . RESTLESS LEGS SYNDROME 08/02/2009  . HYPERLIPIDEMIA 07/23/2009  . OBSTRUCTIVE SLEEP APNEA 07/23/2009   2:38 PM, 11/28/20 M. Sherlyn Lees, PT, DPT Physical Therapist- [place] Office Number: [phone]  [place] [address] [place], [place], [address] Phone: [phone]   Fax:  [phone]  Name: NIKOLAY DEMETRIOU MRN: [id number] Date of Birth: 09/16/1955

## 2020-12-01 ENCOUNTER — Ambulatory Visit (HOSPITAL_COMMUNITY): Payer: Medicare Other

## 2020-12-01 ENCOUNTER — Other Ambulatory Visit: Payer: Self-pay

## 2020-12-01 ENCOUNTER — Encounter (HOSPITAL_COMMUNITY): Payer: Self-pay

## 2020-12-01 DIAGNOSIS — M109 Gout, unspecified: Secondary | ICD-10-CM | POA: Diagnosis not present

## 2020-12-01 DIAGNOSIS — M6281 Muscle weakness (generalized): Secondary | ICD-10-CM | POA: Diagnosis not present

## 2020-12-01 DIAGNOSIS — R2689 Other abnormalities of gait and mobility: Secondary | ICD-10-CM | POA: Diagnosis not present

## 2020-12-01 DIAGNOSIS — E785 Hyperlipidemia, unspecified: Secondary | ICD-10-CM | POA: Diagnosis not present

## 2020-12-01 DIAGNOSIS — M1990 Unspecified osteoarthritis, unspecified site: Secondary | ICD-10-CM | POA: Diagnosis not present

## 2020-12-01 DIAGNOSIS — R262 Difficulty in walking, not elsewhere classified: Secondary | ICD-10-CM | POA: Diagnosis not present

## 2020-12-01 NOTE — Patient Instructions (Signed)
Terminal Knee Extension (Standing)    Facing anchor with right knee slightly bent and green tubing just above knee, gently pull knee back straight. Do not overextend knee. Repeat 10____ times per set. Do _1___ sets per session. Do _1___ sessions per day.  http://orth.exer.us/666   Copyright  VHI. All rights reserved.

## 2020-12-01 NOTE — Therapy (Signed)
[place], [place], [address] Phone: [phone]   Fax:  [phone]  Physical Therapy Treatment  Patient Details  Name: [name] MRN: [id number] Date of Birth: Apr 06, 1956 Referring Provider (PT): Arther Abbott, MD   Encounter Date: 12/01/2020   PT End of Session - 12/01/20 0845    Visit Number 4    Number of Visits 18    Date for PT Re-Evaluation 01/05/21    Authorization Type UHC Medicare, no auth, no VL    Progress Note Due on Visit 10    PT Start Time 0847    PT Stop Time 0932    PT Time Calculation (min) 45 min    Activity Tolerance Patient tolerated treatment well    Behavior During Therapy [facility] for tasks assessed/performed           Past Medical History:  Diagnosis Date  . Anxiety    some panic attacks- off medications  . Arthritis   . Complication of anesthesia    woke up during surgery- Elbow surgery, 2009 and again with foot surgeryry   . Depression   . GERD (gastroesophageal reflux disease)    once in awhile  . Hypercholesteremia   . Sleep apnea    has lost 'abunch of weight' its been yrs since i used it    Past Surgical History:  Procedure Laterality Date  . ANTERIOR CERVICAL DECOMP/DISCECTOMY FUSION N/A 12/21/2017   Procedure: C3-4 ANTERIOR CERVICAL DECOMPRESSION/DISCECTOMY FUSION, ALLOGRAFT, PLATE;  Surgeon: Marybelle Killings, MD;  Location: [place];  Service: Orthopedics;  Laterality: N/A;  . CATARACT EXTRACTION Right   . CATARACT EXTRACTION Left   . COLONOSCOPY  2008   Dr. Oneida Alar: Hyperplastic polyp removed, diverticulosis.  . COLONOSCOPY WITH PROPOFOL N/A 07/11/2018   Procedure: COLONOSCOPY WITH PROPOFOL;  Surgeon: Danie Binder, MD;  [facility];  Service: Endoscopy;  Laterality: N/A;  7:30am  . FOOT SURGERY    . left elbow     . LUMBAR LAMINECTOMY     back in 2015  . MASS EXCISION Left 09/12/2019   Procedure: EXCISION GOUTY TOPHI;  Surgeon: Caprice Beaver,  DPM;  [facility];  Service: Podiatry;  Laterality: Left;  . POLYPECTOMY  07/11/2018   Procedure: POLYPECTOMY;  Surgeon: Danie Binder, MD;  [facility];  Service: Endoscopy;;  colon  . TOTAL KNEE ARTHROPLASTY Left 11/11/2020   Procedure: LEFT TOTAL KNEE ARTHROPLASTY;  Surgeon: Carole Civil, MD;  Location[facility];  Service: Orthopedics;  Laterality: Left;    There were no vitals filed for this visit.   Subjective Assessment - 12/01/20 0845    Subjective Has been using the [facility] for the most part over the last 3 days. Has been backing off on pain meds. Uses hydrocodone at night. Took Tramadol this morning.    Currently in Pain? Yes    Pain Score 3     Pain Location Knee    Pain Orientation Left;Posterior;Lateral    Pain Descriptors / Indicators Tightness    Pain Onset 1 to 4 weeks ago    Aggravating Factors  sitting             [facility] Adult PT Treatment/Exercise - 12/01/20 0001      Knee/Hip Exercises: Stretches   Passive Hamstring Stretch Left;30 seconds;5 reps    Gastroc Stretch Both;3 reps;30 seconds    Gastroc Stretch Limitations slantboard      Knee/Hip Exercises:  Aerobic   Stationary Bike seat 16-14 4 total minutes retro and fwd      Knee/Hip Exercises: Standing   Heel Raises Both;3 sets;10 reps;2 seconds    Knee Flexion Strengthening;Left;3 sets;10 reps    Knee Flexion Limitations 3    Terminal Knee Extension Strengthening;AROM;Left;1 set;10 reps    Theraband Level (Terminal Knee Extension) Level 3 (Green)    Terminal Knee Extension Limitations 5 sec hold    Lunge Walking - Round Trips lunge stretch on 12" step 10 sec hold x10    Other Standing Knee Exercises stair taps x 2 min 3 lbs ankle weights      Knee/Hip Exercises: Seated   Long Arc Quad Strengthening;Left;3 sets;10 reps    Long Arc Quad Weight 6 lbs.    Long [employer] Limitations 5" holds      Knee/Hip Exercises: Supine   Quad Sets Strengthening;Left;10 reps;1 set;5 reps    [employer] Limitations 5" holds    Knee Extension AROM    Knee Extension Limitations 3 degrees lacking    Knee Flexion AROM    Knee Flexion Limitations 118 degrees             PT Education - 12/01/20 1035    Education Details Discussed purpose and technique of interventions throughout session. Advanced HEP.    Person(s) Educated Patient    Methods Explanation;Handout    Comprehension Tactile cues required;Verbal cues required;Need further instruction            PT Short Term Goals - 12/01/20 0846      PT SHORT TERM GOAL #1   Title Patient will report at least 50% improvement in overall symptoms and function to demonstrate overall improved functional ability    Baseline 12/01/20 - patient reports 75% improvement, stair climbing largest deficit    Time 3    Period Weeks    Status Partially Met    Target Date 12/15/20      PT SHORT TERM GOAL #2   Title Patient will be independent with HEP in order to improve functional outcomes.    Time 3    Period Weeks    Status Achieved    Target Date 12/15/20      PT SHORT TERM GOAL #3   Title Demo full left knee extension and 120 degrees flexion left knee to facilitate normalized gait mechanics and transfers    Baseline 12/01/20 - lacking 3 degrees full extension to 118 degrees flexion    Time 3    Period Weeks    Status New    Target Date 12/15/20      PT SHORT TERM GOAL #4   Title Climb/descend 4 stairs with BHR and reciprocal pattern    Baseline 12/01/20 - patient reports attempting stairs at home without complaints of pain, just some discomfort    Time 3    Period Weeks    Status On-going    Target Date 12/15/20             PT Long Term Goals - 12/01/20 0924      PT LONG TERM GOAL #1   Title Patient will improve on FOTO score to meet predicted outcomes to improve functional independence    Baseline 69% function    Time 6    Period Weeks    Status On-going      PT LONG TERM GOAL #2   Title Demo improved ambulation  speed and quality as evidenced by distance of  450 ft during 2MWT using LRAD    Baseline 12/01/20 - ambulating within clinic with [facility] using fair/good gait mechanics    Time 6    Period Weeks    Status On-going                 Plan - 12/01/20 0845    Clinical Impression Statement Progressing well with POC with function improving ambulating with SPC with fair/good gait mechanics. Advised patient wearing athletic shoes would be preferred over sandals for PT sessions. Session focused on open and closed chain terminal knee extension exercises, leg strengthening and left knee ROM exercises. Left flexion AROM increased to 118 degrees from 110. Patient was able to progress recumbent bike to seat 14 at end of 4 minutes with full forward revolution. Patient exhibiting difficulty with VMO activation with open and closed kinetic chain exercises today. Added standing terminal knee extension with green theraband to home exercise program.    Personal Factors and Comorbidities Time since onset of injury/illness/exacerbation    Examination-Activity Limitations Bend;Carry;Lift;Toileting;Stand;Stairs;Squat;Reach Overhead;Locomotion Level;Transfers    Examination-Participation Restrictions Cleaning;Community Activity;Driving;Yard Work    Stability/Clinical Decision Making Stable/Uncomplicated    Rehab Potential Excellent    PT Frequency 3x / week    PT Duration 6 weeks    PT Treatment/Interventions ADLs/Self Care Home Management;Aquatic Therapy;Cryotherapy;Electrical Stimulation;DME Instruction;Gait training;Stair training;Functional mobility training;Therapeutic activities;Therapeutic exercise;Balance training;Patient/family education;Neuromuscular re-education;Passive range of motion;Dry needling;Energy conservation    PT Next Visit Plan Progress to gait training wiht SPC.   TKE based exercises.  Continue ROM and strengthening exercises.    PT Home Exercise Plan QS, heel slides, hamstring stretch, knee flexion  drive on step; 07/20/09 - TKE green theraband    Consulted and Agree with Plan of Care Patient    Family Member Consulted --           Patient will benefit from skilled therapeutic intervention in order to improve the following deficits and impairments:  Abnormal gait,Decreased activity tolerance,Decreased balance,Decreased mobility,Decreased knowledge of use of DME,Decreased endurance,Decreased range of motion,Decreased strength,Increased edema,Difficulty walking,Improper body mechanics,Pain  Visit Diagnosis: Difficulty in walking, not elsewhere classified  Muscle weakness (generalized)  Other abnormalities of gait and mobility     Problem List Patient Active Problem List   Diagnosis Date Noted  . S/P total knee replacement, left 11/11/20 11/25/2020  . Primary localized osteoarthritis of knee 11/11/2020  . Primary osteoarthritis of left knee   . Encounter for screening colonoscopy 05/09/2018  . Polyphagia 05/09/2018  . Complication of anesthesia 05/09/2018  . Superior labrum anterior-to-posterior (SLAP) tear of right shoulder 03/29/2018  . History of fusion of cervical spine 12/28/2017  . Status post lumbar spinal fusion 11/15/2017  . RESTLESS LEGS SYNDROME 08/02/2009  . HYPERLIPIDEMIA 07/23/2009  . OBSTRUCTIVE SLEEP APNEA 07/23/2009   Floria Raveling. Hartnett-Rands, MS, PT Per [place] [phone]  Jeannie Done 12/01/2020, 10:43 AM  [place], [place], [address] Phone: [phone]   Fax:  [phone]  Name: [name] MRN: [id number] Date of Birth: March 08, 1956

## 2020-12-03 ENCOUNTER — Encounter (HOSPITAL_COMMUNITY): Payer: Medicare Other | Admitting: Physical Therapy

## 2020-12-05 ENCOUNTER — Encounter (HOSPITAL_COMMUNITY): Payer: Self-pay

## 2020-12-05 ENCOUNTER — Ambulatory Visit (HOSPITAL_COMMUNITY): Payer: Medicare Other

## 2020-12-05 ENCOUNTER — Other Ambulatory Visit: Payer: Self-pay

## 2020-12-05 DIAGNOSIS — M6281 Muscle weakness (generalized): Secondary | ICD-10-CM | POA: Diagnosis not present

## 2020-12-05 DIAGNOSIS — R262 Difficulty in walking, not elsewhere classified: Secondary | ICD-10-CM | POA: Diagnosis not present

## 2020-12-05 DIAGNOSIS — R2689 Other abnormalities of gait and mobility: Secondary | ICD-10-CM

## 2020-12-05 NOTE — Patient Instructions (Signed)
Access Code: [email or web address] URL: [email or web address] Date: 12/05/2020 Prepared by: Sherlyn Lees  Exercises Seated Table Hamstring Stretch - 1 x daily - 7 x weekly - 3 sets - 3 reps - 60 sec hold Supine ITB Stretch with Strap - 1 x daily - 7 x weekly - 3 sets - 10 reps - 60 sec hold Prone Quadriceps Stretch with Strap - 1 x daily - 7 x weekly - 3 sets - 3 reps - 60 sec hold

## 2020-12-05 NOTE — Therapy (Signed)
[place], [place], [address] Phone: [phone]   Fax:  [phone]  Physical Therapy Treatment  Patient Details  Name: [name] MRN: [id number] Date of Birth: 01/26/1956 Referring Provider (PT): Arther Abbott, MD   Encounter Date: 12/05/2020   PT End of Session - 12/05/20 1258    Visit Number 5    Number of Visits 18    Date for PT Re-Evaluation 01/05/21    Authorization Type UHC Medicare, no auth, no VL    Progress Note Due on Visit 10    PT Start Time 1300    PT Stop Time 1345    PT Time Calculation (min) 45 min    Activity Tolerance Patient tolerated treatment well    Behavior During Therapy [facility] for tasks assessed/performed           Past Medical History:  Diagnosis Date  . Anxiety    some panic attacks- off medications  . Arthritis   . Complication of anesthesia    woke up during surgery- Elbow surgery, 2009 and again with foot surgeryry   . Depression   . GERD (gastroesophageal reflux disease)    once in awhile  . Hypercholesteremia   . Sleep apnea    has lost 'abunch of weight' its been yrs since i used it    Past Surgical History:  Procedure Laterality Date  . ANTERIOR CERVICAL DECOMP/DISCECTOMY FUSION N/A 12/21/2017   Procedure: C3-4 ANTERIOR CERVICAL DECOMPRESSION/DISCECTOMY FUSION, ALLOGRAFT, PLATE;  Surgeon: Marybelle Killings, MD;  Location: [facility];  Service: Orthopedics;  Laterality: N/A;  . CATARACT EXTRACTION Right   . CATARACT EXTRACTION Left   . COLONOSCOPY  2008   Dr. Oneida Alar: Hyperplastic polyp removed, diverticulosis.  . COLONOSCOPY WITH PROPOFOL N/A 07/11/2018   Procedure: COLONOSCOPY WITH PROPOFOL;  Surgeon: Danie Binder, MD;  [facility];  Service: Endoscopy;  Laterality: N/A;  7:30am  . FOOT SURGERY    . left elbow     . LUMBAR LAMINECTOMY     back in 2015  . MASS EXCISION Left 09/12/2019   Procedure: EXCISION GOUTY TOPHI;  Surgeon: Caprice Beaver,  DPM;  [facility];  Service: Podiatry;  Laterality: Left;  . POLYPECTOMY  07/11/2018   Procedure: POLYPECTOMY;  Surgeon: Danie Binder, MD;  [facility];  Service: Endoscopy;;  colon  . TOTAL KNEE ARTHROPLASTY Left 11/11/2020   Procedure: LEFT TOTAL KNEE ARTHROPLASTY;  Surgeon: Carole Civil, MD;  [facility];  Service: Orthopedics;  Laterality: Left;    There were no vitals filed for this visit.   Subjective Assessment - 12/05/20 1303    Subjective Pt reports left knee has been feeling more sore lately, but notes he has been far more active as well.  No unusual swelling or discoloration noted.    Currently in Pain? Yes    Pain Score 4     Pain Location Knee    Pain Orientation Left;Lateral    Pain Descriptors / Indicators Tightness;Pressure    Pain Type Surgical pain    Pain Onset 1 to 4 weeks ago                             [facility] Adult PT Treatment/Exercise - 12/05/20 0001      Knee/Hip Exercises: Stretches   Passive Hamstring Stretch Left;60 seconds;4 reps;2 reps   sitting EOB with LLE on  bed. Then supine SLR hamstring stretch 2x60 sec   Quad Stretch Left;2 reps;60 seconds    ITB Stretch Left;2 reps;60 seconds      Knee/Hip Exercises: Aerobic   Stationary Bike seat 16-14 5 total minutes retro and fwd      Knee/Hip Exercises: Machines for Strengthening   Cybex Knee Flexion 6 plates, 3x10    Total Gym Leg Press 34 degrees, 3x10      Knee/Hip Exercises: Standing   Heel Raises Both;3 sets;10 reps;2 seconds    Terminal Knee Extension Strengthening    Theraband Level (Terminal Knee Extension) Other (comment)   Tower of Power!, 4 plates   Terminal Knee Extension Limitations 2 sec hold    Lunge Walking - Round Trips lunge stretch on 12" step 10 sec hold x10    Gait Training resisted retro/forward, 4 plates x 2 min      Knee/Hip Exercises: Seated   Sit to Sand 2 sets;10 reps   1x10 with 8# dumbell deadlift, 1x10 goblet squat                    PT Short Term Goals - 12/01/20 0846      PT SHORT TERM GOAL #1   Title Patient will report at least 50% improvement in overall symptoms and function to demonstrate overall improved functional ability    Baseline 12/01/20 - patient reports 75% improvement, stair climbing largest deficit    Time 3    Period Weeks    Status Partially Met    Target Date 12/15/20      PT SHORT TERM GOAL #2   Title Patient will be independent with HEP in order to improve functional outcomes.    Time 3    Period Weeks    Status Achieved    Target Date 12/15/20      PT SHORT TERM GOAL #3   Title Demo full left knee extension and 120 degrees flexion left knee to facilitate normalized gait mechanics and transfers    Baseline 12/01/20 - lacking 3 degrees full extension to 118 degrees flexion    Time 3    Period Weeks    Status New    Target Date 12/15/20      PT SHORT TERM GOAL #4   Title Climb/descend 4 stairs with BHR and reciprocal pattern    Baseline 12/01/20 - patient reports attempting stairs at home without complaints of pain, just some discomfort    Time 3    Period Weeks    Status On-going    Target Date 12/15/20             PT Long Term Goals - 12/01/20 0924      PT LONG TERM GOAL #1   Title Patient will improve on FOTO score to meet predicted outcomes to improve functional independence    Baseline 69% function    Time 6    Period Weeks    Status On-going      PT LONG TERM GOAL #2   Title Demo improved ambulation speed and quality as evidenced by distance of 450 ft during 2MWT using LRAD    Baseline 12/01/20 - ambulating within clinic with [facility] using fair/good gait mechanics    Time 6    Period Weeks    Status On-going                 Plan - 12/05/20 1337    Clinical Impression Statement Tolerating tx sessions well with  some new onset of left, lateral knee pain which pt outlines along insertion of IT band.  Tolerating increased resistance exercises  and demo improved symmetry with squat form with increased weight acceptance to LLE with improved quad facilitation appreciated. Added additional stretches for LLE to HEP    Personal Factors and Comorbidities Time since onset of injury/illness/exacerbation    Examination-Activity Limitations Bend;Carry;Lift;Toileting;Stand;Stairs;Squat;Reach Overhead;Locomotion Level;Transfers    Examination-Participation Restrictions Cleaning;Community Activity;Driving;Yard Work    Stability/Clinical Decision Making Stable/Uncomplicated    Rehab Potential Excellent    PT Frequency 3x / week    PT Duration 6 weeks    PT Treatment/Interventions ADLs/Self Care Home Management;Aquatic Therapy;Cryotherapy;Electrical Stimulation;DME Instruction;Gait training;Stair training;Functional mobility training;Therapeutic activities;Therapeutic exercise;Balance training;Patient/family education;Neuromuscular re-education;Passive range of motion;Dry needling;Energy conservation    PT Next Visit Plan Progress to gait training wiht SPC.   TKE based exercises.  Continue ROM and strengthening exercises.    PT Home Exercise Plan QS, heel slides, hamstring stretch, knee flexion drive on step; 5/85/92 - TKE green theraband    Consulted and Agree with Plan of Care Patient           Patient will benefit from skilled therapeutic intervention in order to improve the following deficits and impairments:  Abnormal gait,Decreased activity tolerance,Decreased balance,Decreased mobility,Decreased knowledge of use of DME,Decreased endurance,Decreased range of motion,Decreased strength,Increased edema,Difficulty walking,Improper body mechanics,Pain  Visit Diagnosis: Difficulty in walking, not elsewhere classified  Muscle weakness (generalized)  Other abnormalities of gait and mobility     Problem List Patient Active Problem List   Diagnosis Date Noted  . S/P total knee replacement, left 11/11/20 11/25/2020  . Primary localized  osteoarthritis of knee 11/11/2020  . Primary osteoarthritis of left knee   . Encounter for screening colonoscopy 05/09/2018  . Polyphagia 05/09/2018  . Complication of anesthesia 05/09/2018  . Superior labrum anterior-to-posterior (SLAP) tear of right shoulder 03/29/2018  . History of fusion of cervical spine 12/28/2017  . Status post lumbar spinal fusion 11/15/2017  . RESTLESS LEGS SYNDROME 08/02/2009  . HYPERLIPIDEMIA 07/23/2009  . OBSTRUCTIVE SLEEP APNEA 07/23/2009   1:48 PM, 12/05/20 M. Sherlyn Lees, PT, DPT Physical Therapist- [place] Office Number: [phone]  [place] [address] [place], [place], [address] Phone: [phone]   Fax:  [phone]  Name: AZAZEL FRANZE MRN: [id number] Date of Birth: 1956-07-15

## 2020-12-08 ENCOUNTER — Ambulatory Visit (HOSPITAL_COMMUNITY): Payer: Medicare Other

## 2020-12-10 ENCOUNTER — Ambulatory Visit (HOSPITAL_COMMUNITY): Payer: Medicare Other

## 2020-12-12 ENCOUNTER — Ambulatory Visit (HOSPITAL_COMMUNITY): Payer: Medicare Other | Admitting: Physical Therapy

## 2020-12-12 ENCOUNTER — Encounter (HOSPITAL_COMMUNITY): Payer: Self-pay | Admitting: Physical Therapy

## 2020-12-12 ENCOUNTER — Other Ambulatory Visit: Payer: Self-pay

## 2020-12-12 DIAGNOSIS — R262 Difficulty in walking, not elsewhere classified: Secondary | ICD-10-CM | POA: Diagnosis not present

## 2020-12-12 DIAGNOSIS — M6281 Muscle weakness (generalized): Secondary | ICD-10-CM | POA: Diagnosis not present

## 2020-12-12 DIAGNOSIS — R2689 Other abnormalities of gait and mobility: Secondary | ICD-10-CM | POA: Diagnosis not present

## 2020-12-12 NOTE — Therapy (Signed)
[place], [place], [address] Phone: [phone]   Fax:  [phone]  Physical Therapy Treatment  Patient Details  Name: [name] MRN: [id number] Date of Birth: 1955-12-25 Referring Provider (PT): Arther Abbott, MD   Encounter Date: 12/12/2020   PT End of Session - 12/12/20 1041    Visit Number 6    Number of Visits 18    Date for PT Re-Evaluation 01/05/21    Authorization Type UHC Medicare, no auth, no VL    Progress Note Due on Visit 10    PT Start Time 0900    PT Stop Time 0938    PT Time Calculation (min) 38 min    Activity Tolerance Patient tolerated treatment well    Behavior During Therapy [facility] for tasks assessed/performed           Past Medical History:  Diagnosis Date  . Anxiety    some panic attacks- off medications  . Arthritis   . Complication of anesthesia    woke up during surgery- Elbow surgery, 2009 and again with foot surgeryry   . Depression   . GERD (gastroesophageal reflux disease)    once in awhile  . Hypercholesteremia   . Sleep apnea    has lost 'abunch of weight' its been yrs since i used it    Past Surgical History:  Procedure Laterality Date  . ANTERIOR CERVICAL DECOMP/DISCECTOMY FUSION N/A 12/21/2017   Procedure: C3-4 ANTERIOR CERVICAL DECOMPRESSION/DISCECTOMY FUSION, ALLOGRAFT, PLATE;  Surgeon: Marybelle Killings, MD;  Location: [facility];  Service: Orthopedics;  Laterality: N/A;  . CATARACT EXTRACTION Right   . CATARACT EXTRACTION Left   . COLONOSCOPY  2008   Dr. Oneida Alar: Hyperplastic polyp removed, diverticulosis.  . COLONOSCOPY WITH PROPOFOL N/A 07/11/2018   Procedure: COLONOSCOPY WITH PROPOFOL;  Surgeon: Danie Binder, MD;  [facility];  Service: Endoscopy;  Laterality: N/A;  7:30am  . FOOT SURGERY    . left elbow     . LUMBAR LAMINECTOMY     back in 2015  . MASS EXCISION Left 09/12/2019   Procedure: EXCISION GOUTY TOPHI;  Surgeon: Caprice Beaver,  DPM;  [facility];  Service: Podiatry;  Laterality: Left;  . POLYPECTOMY  07/11/2018   Procedure: POLYPECTOMY;  Surgeon: Danie Binder, MD;  [facility];  Service: Endoscopy;;  colon  . TOTAL KNEE ARTHROPLASTY Left 11/11/2020   Procedure: LEFT TOTAL KNEE ARTHROPLASTY;  Surgeon: Carole Civil, MD;  [facility];  Service: Orthopedics;  Laterality: Left;    There were no vitals filed for this visit.   Subjective Assessment - 12/12/20 0902    Subjective Pt reports slight pain, exercises going well at home.    Currently in Pain? Yes    Pain Score 2     Pain Location Knee    Pain Orientation Left    Pain Onset 1 to 4 weeks ago                             [facility] Adult PT Treatment/Exercise - 12/12/20 0001      Knee/Hip Exercises: Stretches   Passive Hamstring Stretch Left;3 reps;60 seconds   XL box     Knee/Hip Exercises: Aerobic   Stationary Bike seat 12, 5 min, fwd and back.      Knee/Hip Exercises: Standing   Terminal Knee Extension Strengthening    Theraband Level (  Terminal Knee Extension) Other (comment)   level 4 x15, level 5 x10   Terminal Knee Extension Limitations 2-4 sec hold.    Forward Step Up Left;2 sets;20 reps;Hand Hold: 1;Step Height: 6"   push off   Wall Squat 1 set;10 reps;5 seconds   slides, increased pain with back against wall   Lunge Walking - Round Trips 12" step rocking, 2x1 min, good ROM throughout    SLS good B, avg 15-20 sec before UE assist.      Knee/Hip Exercises: Supine   Quad Sets Strengthening;Left   pillow under for increased stretch                 PT Education - 12/12/20 0916    Education Details Scar massage, knee mobilizations, hamstring stretch on high step.    Person(s) Educated Patient    Methods Explanation;Demonstration;Tactile cues    Comprehension Verbalized understanding            PT Short Term Goals - 12/01/20 0846      PT SHORT TERM GOAL #1   Title Patient will report  at least 50% improvement in overall symptoms and function to demonstrate overall improved functional ability    Baseline 12/01/20 - patient reports 75% improvement, stair climbing largest deficit    Time 3    Period Weeks    Status Partially Met    Target Date 12/15/20      PT SHORT TERM GOAL #2   Title Patient will be independent with HEP in order to improve functional outcomes.    Time 3    Period Weeks    Status Achieved    Target Date 12/15/20      PT SHORT TERM GOAL #3   Title Demo full left knee extension and 120 degrees flexion left knee to facilitate normalized gait mechanics and transfers    Baseline 12/01/20 - lacking 3 degrees full extension to 118 degrees flexion    Time 3    Period Weeks    Status New    Target Date 12/15/20      PT SHORT TERM GOAL #4   Title Climb/descend 4 stairs with BHR and reciprocal pattern    Baseline 12/01/20 - patient reports attempting stairs at home without complaints of pain, just some discomfort    Time 3    Period Weeks    Status On-going    Target Date 12/15/20             PT Long Term Goals - 12/01/20 0924      PT LONG TERM GOAL #1   Title Patient will improve on FOTO score to meet predicted outcomes to improve functional independence    Baseline 69% function    Time 6    Period Weeks    Status On-going      PT LONG TERM GOAL #2   Title Demo improved ambulation speed and quality as evidenced by distance of 450 ft during 2MWT using LRAD    Baseline 12/01/20 - ambulating within clinic with [facility] using fair/good gait mechanics    Time 6    Period Weeks    Status On-going                 Plan - 12/12/20 1042    Clinical Impression Statement Cont report intermittent L medial knee pain with step ups and wall slides, but eases with knee flexion in <30 sec. Cont demo good active and passive knee flexion, with slight  limitation in TKE endurance and ROM, especially seen in step up TKE. Demo good SLS bilaterally, with increased  ankle activation allowing for improved safe navigation of environment.    Personal Factors and Comorbidities Time since onset of injury/illness/exacerbation    Examination-Activity Limitations Bend;Carry;Lift;Toileting;Stand;Stairs;Squat;Reach Overhead;Locomotion Level;Transfers    Examination-Participation Restrictions Cleaning;Community Activity;Driving;Yard Work    Stability/Clinical Decision Making Stable/Uncomplicated    Rehab Potential Excellent    PT Frequency 3x / week    PT Duration 6 weeks    PT Treatment/Interventions ADLs/Self Care Home Management;Aquatic Therapy;Cryotherapy;Electrical Stimulation;DME Instruction;Gait training;Stair training;Functional mobility training;Therapeutic activities;Therapeutic exercise;Balance training;Patient/family education;Neuromuscular re-education;Passive range of motion;Dry needling;Energy conservation    PT Next Visit Plan Progress to gait training wiht SPC.   TKE based exercises.  Continue ROM and strengthening exercises.    PT Home Exercise Plan QS, heel slides, hamstring stretch, knee flexion drive on step; 3/49/49 - TKE green theraband 5/27: step up with power up, hamstring streth with either foot propped or on elevated surface.    Consulted and Agree with Plan of Care Patient           Patient will benefit from skilled therapeutic intervention in order to improve the following deficits and impairments:  Abnormal gait,Decreased activity tolerance,Decreased balance,Decreased mobility,Decreased knowledge of use of DME,Decreased endurance,Decreased range of motion,Decreased strength,Increased edema,Difficulty walking,Improper body mechanics,Pain  Visit Diagnosis: Difficulty in walking, not elsewhere classified  Muscle weakness (generalized)  Other abnormalities of gait and mobility     Problem List Patient Active Problem List   Diagnosis Date Noted  . S/P total knee replacement, left 11/11/20 11/25/2020  . Primary localized  osteoarthritis of knee 11/11/2020  . Primary osteoarthritis of left knee   . Encounter for screening colonoscopy 05/09/2018  . Polyphagia 05/09/2018  . Complication of anesthesia 05/09/2018  . Superior labrum anterior-to-posterior (SLAP) tear of right shoulder 03/29/2018  . History of fusion of cervical spine 12/28/2017  . Status post lumbar spinal fusion 11/15/2017  . RESTLESS LEGS SYNDROME 08/02/2009  . HYPERLIPIDEMIA 07/23/2009  . OBSTRUCTIVE SLEEP APNEA 07/23/2009    10:48 AM,12/12/20 Domenic Moras, PT, DPT Physical Therapist at [place], [place], [address] Phone: [phone]   Fax:  [phone]  Name: [name] MRN: [id number] Date of Birth: 1955-07-30

## 2020-12-16 ENCOUNTER — Encounter (HOSPITAL_COMMUNITY): Payer: Self-pay

## 2020-12-16 ENCOUNTER — Ambulatory Visit (HOSPITAL_COMMUNITY): Payer: Medicare Other

## 2020-12-16 ENCOUNTER — Other Ambulatory Visit: Payer: Self-pay

## 2020-12-16 DIAGNOSIS — R262 Difficulty in walking, not elsewhere classified: Secondary | ICD-10-CM

## 2020-12-16 DIAGNOSIS — R2689 Other abnormalities of gait and mobility: Secondary | ICD-10-CM | POA: Diagnosis not present

## 2020-12-16 DIAGNOSIS — M6281 Muscle weakness (generalized): Secondary | ICD-10-CM | POA: Diagnosis not present

## 2020-12-16 NOTE — Therapy (Addendum)
[facility] [address] [place], [place], [address] Phone: [phone]   Fax:  [phone]  Physical Therapy Treatment and Discharge Summary  Patient Details  Name: [name] MRN: [id number] Date of Birth: Jun 20, 1956 Referring Provider (PT): Arther Abbott, MD  PHYSICAL THERAPY DISCHARGE SUMMARY  Visits from Start of Care: 7  Current functional level related to goals / functional outcomes: All goals met, no functional limitations at this time   Remaining deficits: None   Education / Equipment: Comprehensive HEP Plan: Patient agrees to discharge.  Patient goals were met. Patient is being discharged due to meeting the stated rehab goals.  ?????      Encounter Date: 12/16/2020   PT End of Session - 12/16/20 1413    Visit Number 7    Number of Visits 18    Date for PT Re-Evaluation 01/05/21    Authorization Type [employer] Medicare, no auth, no VL    Progress Note Due on Visit 10    PT Start Time 1403    PT Stop Time 1445    PT Time Calculation (min) 42 min    Activity Tolerance Patient tolerated treatment well    Behavior During Therapy WFL for tasks assessed/performed           Past Medical History:  Diagnosis Date  . Anxiety    some panic attacks- off medications  . Arthritis   . Complication of anesthesia    woke up during surgery- Elbow surgery, 2009 and again with foot surgeryry   . Depression   . GERD (gastroesophageal reflux disease)    once in awhile  . Hypercholesteremia   . Sleep apnea    has lost 'abunch of weight' its been yrs since i used it    Past Surgical History:  Procedure Laterality Date  . ANTERIOR CERVICAL DECOMP/DISCECTOMY FUSION N/A 12/21/2017   Procedure: C3-4 ANTERIOR CERVICAL DECOMPRESSION/DISCECTOMY FUSION, ALLOGRAFT, PLATE;  Surgeon: Marybelle Killings, MD;  Location: [facility];  Service: Orthopedics;  Laterality: N/A;  . CATARACT EXTRACTION Right   . CATARACT EXTRACTION Left   . COLONOSCOPY   2008   Dr. Oneida Alar: Hyperplastic polyp removed, diverticulosis.  . COLONOSCOPY WITH PROPOFOL N/A 07/11/2018   Procedure: COLONOSCOPY WITH PROPOFOL;  Surgeon: Danie Binder, MD;  [facility];  Service: Endoscopy;  Laterality: N/A;  7:30am  . FOOT SURGERY    . left elbow     . LUMBAR LAMINECTOMY     back in 2015  . MASS EXCISION Left 09/12/2019   Procedure: EXCISION GOUTY TOPHI;  Surgeon: Caprice Beaver, DPM;  Location[facility];  Service: Podiatry;  Laterality: Left;  . POLYPECTOMY  07/11/2018   Procedure: POLYPECTOMY;  Surgeon: Danie Binder, MD;  [facility];  Service: Endoscopy;;  colon  . TOTAL KNEE ARTHROPLASTY Left 11/11/2020   Procedure: LEFT TOTAL KNEE ARTHROPLASTY;  Surgeon: Carole Civil, MD;  Location[facility];  Service: Orthopedics;  Laterality: Left;    There were no vitals filed for this visit.   Subjective Assessment - 12/16/20 1412    Subjective Knee is feeling pretty good today.  Main difficulty going up and down stairs.    Currently in Pain? Yes    Pain Score 1     Pain Location Knee    Pain Orientation Left    Pain Descriptors / Indicators Tightness;Sore    Pain Type Surgical pain    Pain Onset 1 to 4 weeks ago  Pain Frequency Intermittent    Aggravating Factors  sitting    Pain Relieving Factors ice, CPM, movement, elevation              [facility] PT Assessment - 12/16/20 0001      Assessment   Medical Diagnosis left TKR    Referring Provider (PT) Arther Abbott, MD    Onset Date/Surgical Date 11/11/20    Hand Dominance Right    Next MD Visit 12/18/20    Prior Therapy [facility]      AROM   AROM Assessment Site Knee    Right/Left Knee Left    Left Knee Extension 0    Left Knee Flexion 122      Strength   Strength Assessment Site Knee    Right/Left Knee Left    Left Knee Flexion 4+/5   was 3+   Left Knee Extension 4+/5   was 3+                        [facility] Adult PT Treatment/Exercise - 12/16/20 0001       Ambulation/Gait   Ambulation/Gait Yes    Ambulation/Gait Assistance 7: Independent    Ambulation Distance (Feet) 472 Feet    Assistive device None    Gait Pattern Step-through pattern    Gait Comments 2MWT      Knee/Hip Exercises: Aerobic   Stationary Bike seat 12, 5 min, fwd      Knee/Hip Exercises: Machines for Strengthening   Cybex Knee Flexion 6 plates, 3x10    Cybex Leg Press 4Pl 3x 10      Knee/Hip Exercises: Standing   Lateral Step Up Left;10 reps;Hand Hold: 2;Step Height: 4"    Forward Step Up Left;2 sets;10 reps;Hand Hold: 1;Step Height: 6"    Step Down Left;10 reps;Hand Hold: 1;Step Height: 4";Step Height: 6"    SLS Rt 20" Lt 11" max    SLS with Vectors 3x 5" BLE with 1 HHA                    PT Short Term Goals - 12/16/20 1439      PT SHORT TERM GOAL #1   Title Patient will report at least 50% improvement in overall symptoms and function to demonstrate overall improved functional ability    Baseline 12/16/20:  90% improvements, still has occasional stiffness, reports stairs are improving.  12/01/20 - patient reports 75% improvement, stair climbing largest deficit    Status Achieved      PT SHORT TERM GOAL #2   Title Patient will be independent with HEP in order to improve functional outcomes.    Status Achieved      PT SHORT TERM GOAL #3   Title Demo full left knee extension and 120 degrees flexion left knee to facilitate normalized gait mechanics and transfers    Baseline 12/16/20: 0-120 degrees 12/01/20 - lacking 3 degrees full extension to 118 degrees flexion    Status Achieved      PT SHORT TERM GOAL #4   Title Climb/descend 4 stairs with BHR and reciprocal pattern    Baseline 12/16/20:  Able to demonstrate reciprocal pattern with 1 HR; 12/01/20 - patient reports attempting stairs at home without complaints of pain, just some discomfort    Status Achieved             PT Long Term Goals - 12/16/20 2056      PT LONG TERM GOAL #1  Title  Patient will improve on FOTO score to meet predicted outcomes to improve functional independence    Baseline 69% function; 5/31: unable to find on FOTO.    Status Unable to assess      PT LONG TERM GOAL #2   Title Demo improved ambulation speed and quality as evidenced by distance of 450 ft during 2MWT using LRAD    Baseline 12/16/20: 2MWT no AD 439f;  12/01/20 - [facility]    Status Achieved                 Plan - 12/16/20 2058    Clinical Impression Statement Reviewed goals prior MD apt with the following findings:  Pt has met 4/4 STG and 1/23 LTGs.  Pt able to demonstrate AROM 0-122 degrees, ambulates without AD with proper gait mechanics, no reports of pain wiht functional tasks, reoprts 90% improvements.  Unable to find on FOTO so unable to assess that goal.  Reviewed current HEP to continue to home.    Personal Factors and Comorbidities Time since onset of injury/illness/exacerbation    Examination-Activity Limitations Bend;Carry;Lift;Toileting;Stand;Stairs;Squat;Reach Overhead;Locomotion Level;Transfers    Examination-Participation Restrictions Cleaning;Community Activity;Driving;Yard Work    Stability/Clinical Decision Making Stable/Uncomplicated    CDesigner, jewelleryLow    Rehab Potential Excellent    PT Frequency 3x / week    PT Duration 6 weeks    PT Treatment/Interventions ADLs/Self Care Home Management;Aquatic Therapy;Cryotherapy;Electrical Stimulation;DME Instruction;Gait training;Stair training;Functional mobility training;Therapeutic activities;Therapeutic exercise;Balance training;Patient/family education;Neuromuscular re-education;Passive range of motion;Dry needling;Energy conservation    PT Next Visit Plan DC to HEP per all goals met.    PT Home Exercise Plan QS, heel slides, hamstring stretch, knee flexion drive on step; 51/51/76- TKE green theraband 5/27: step up with power up, hamstring streth with either  foot propped or on elevated surface.    Consulted and Agree with Plan of Care Patient           Patient will benefit from skilled therapeutic intervention in order to improve the following deficits and impairments:  Abnormal gait,Decreased activity tolerance,Decreased balance,Decreased mobility,Decreased knowledge of use of DME,Decreased endurance,Decreased range of motion,Decreased strength,Increased edema,Difficulty walking,Improper body mechanics,Pain  Visit Diagnosis: Difficulty in walking, not elsewhere classified  Muscle weakness (generalized)  Other abnormalities of gait and mobility     Problem List Patient Active Problem List   Diagnosis Date Noted  . S/P total knee replacement, left 11/11/20 11/25/2020  . Primary localized osteoarthritis of knee 11/11/2020  . Primary osteoarthritis of left knee   . Encounter for screening colonoscopy 05/09/2018  . Polyphagia 05/09/2018  . Complication of anesthesia 05/09/2018  . Superior labrum anterior-to-posterior (SLAP) tear of right shoulder 03/29/2018  . History of fusion of cervical spine 12/28/2017  . Status post lumbar spinal fusion 11/15/2017  . RESTLESS LEGS SYNDROME 08/02/2009  . HYPERLIPIDEMIA 07/23/2009  . OBSTRUCTIVE SLEEP APNEA 07/23/2009   CIhor Austin LPTA/CLT; CBIS [phone] CAldona Lento5/31/2022, 9:03 PM   7:48 AM, 12/22/20 M. KSherlyn Lees PT, DPT Physical Therapist- [place] Office Number: [phone]  [place]SW[place] [address]Phone: [phone]  Fax:  [phone] Name: Peter EBRONMRN: [id number] of Birth: [birth year]

## 2020-12-18 ENCOUNTER — Ambulatory Visit (INDEPENDENT_AMBULATORY_CARE_PROVIDER_SITE_OTHER): Payer: Medicare Other | Admitting: Orthopedic Surgery

## 2020-12-18 ENCOUNTER — Encounter (HOSPITAL_COMMUNITY): Payer: Medicare Other

## 2020-12-18 ENCOUNTER — Other Ambulatory Visit: Payer: Self-pay

## 2020-12-18 DIAGNOSIS — Z96652 Presence of left artificial knee joint: Secondary | ICD-10-CM

## 2020-12-18 NOTE — Progress Notes (Signed)
Postop visit status post left total knee  The patient is doing well.  He has regained his range of motion 0-1 25 and he has no pain in his left knee  The knee looks great there are no signs of infection is stable he is walking without a limp  I will see him for his annual x-ray  Encounter Diagnosis  Name Primary?  . S/P total knee replacement, left 11/11/20 Yes

## 2020-12-19 ENCOUNTER — Encounter (HOSPITAL_COMMUNITY): Payer: Medicare Other

## 2020-12-22 ENCOUNTER — Encounter (HOSPITAL_COMMUNITY): Payer: Medicare Other

## 2020-12-24 ENCOUNTER — Encounter (HOSPITAL_COMMUNITY): Payer: Medicare Other | Admitting: Physical Therapy

## 2020-12-26 ENCOUNTER — Encounter (HOSPITAL_COMMUNITY): Payer: Medicare Other

## 2021-02-17 IMAGING — DX DG CHEST 1V PORT
2 series · 2 of 2 positions shown · non-contrast
Comparison: 10/05/2017

CLINICAL DATA: COVID positive

EXAM:
PORTABLE CHEST 1 VIEW

[chest ap (1 of 2)]
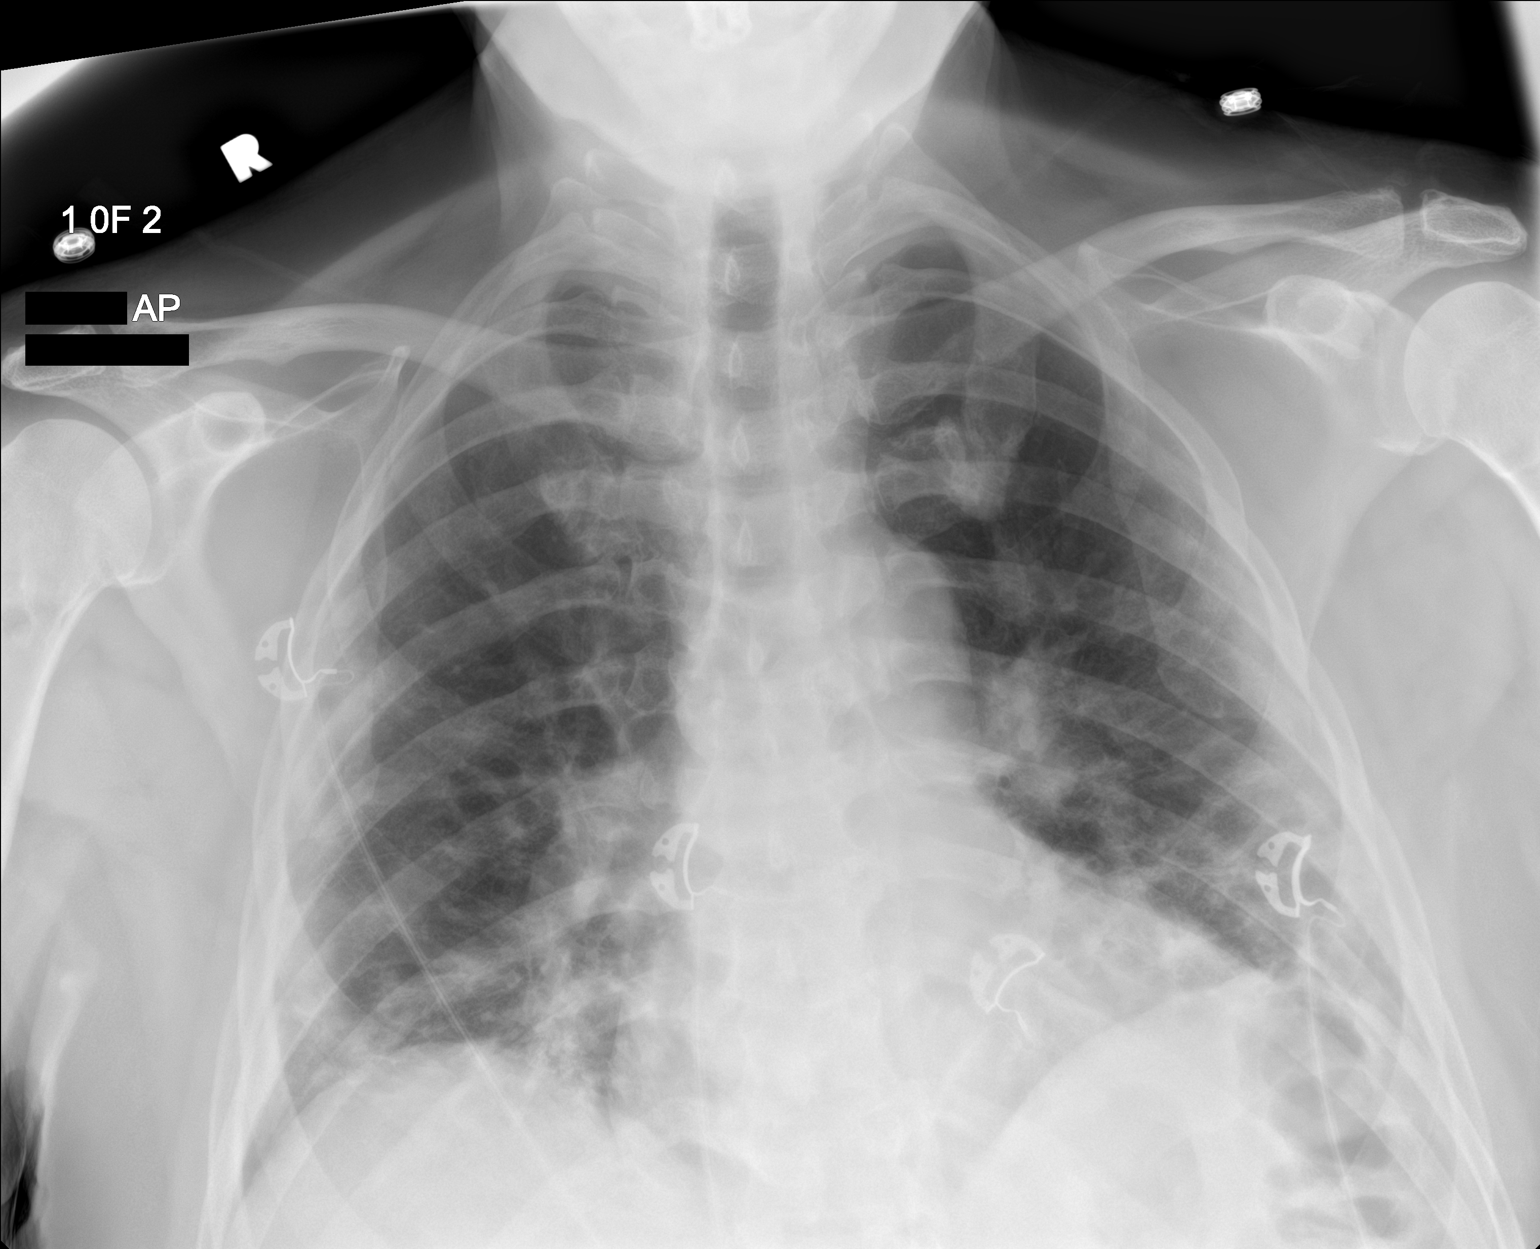

[chest ap (2 of 2)]
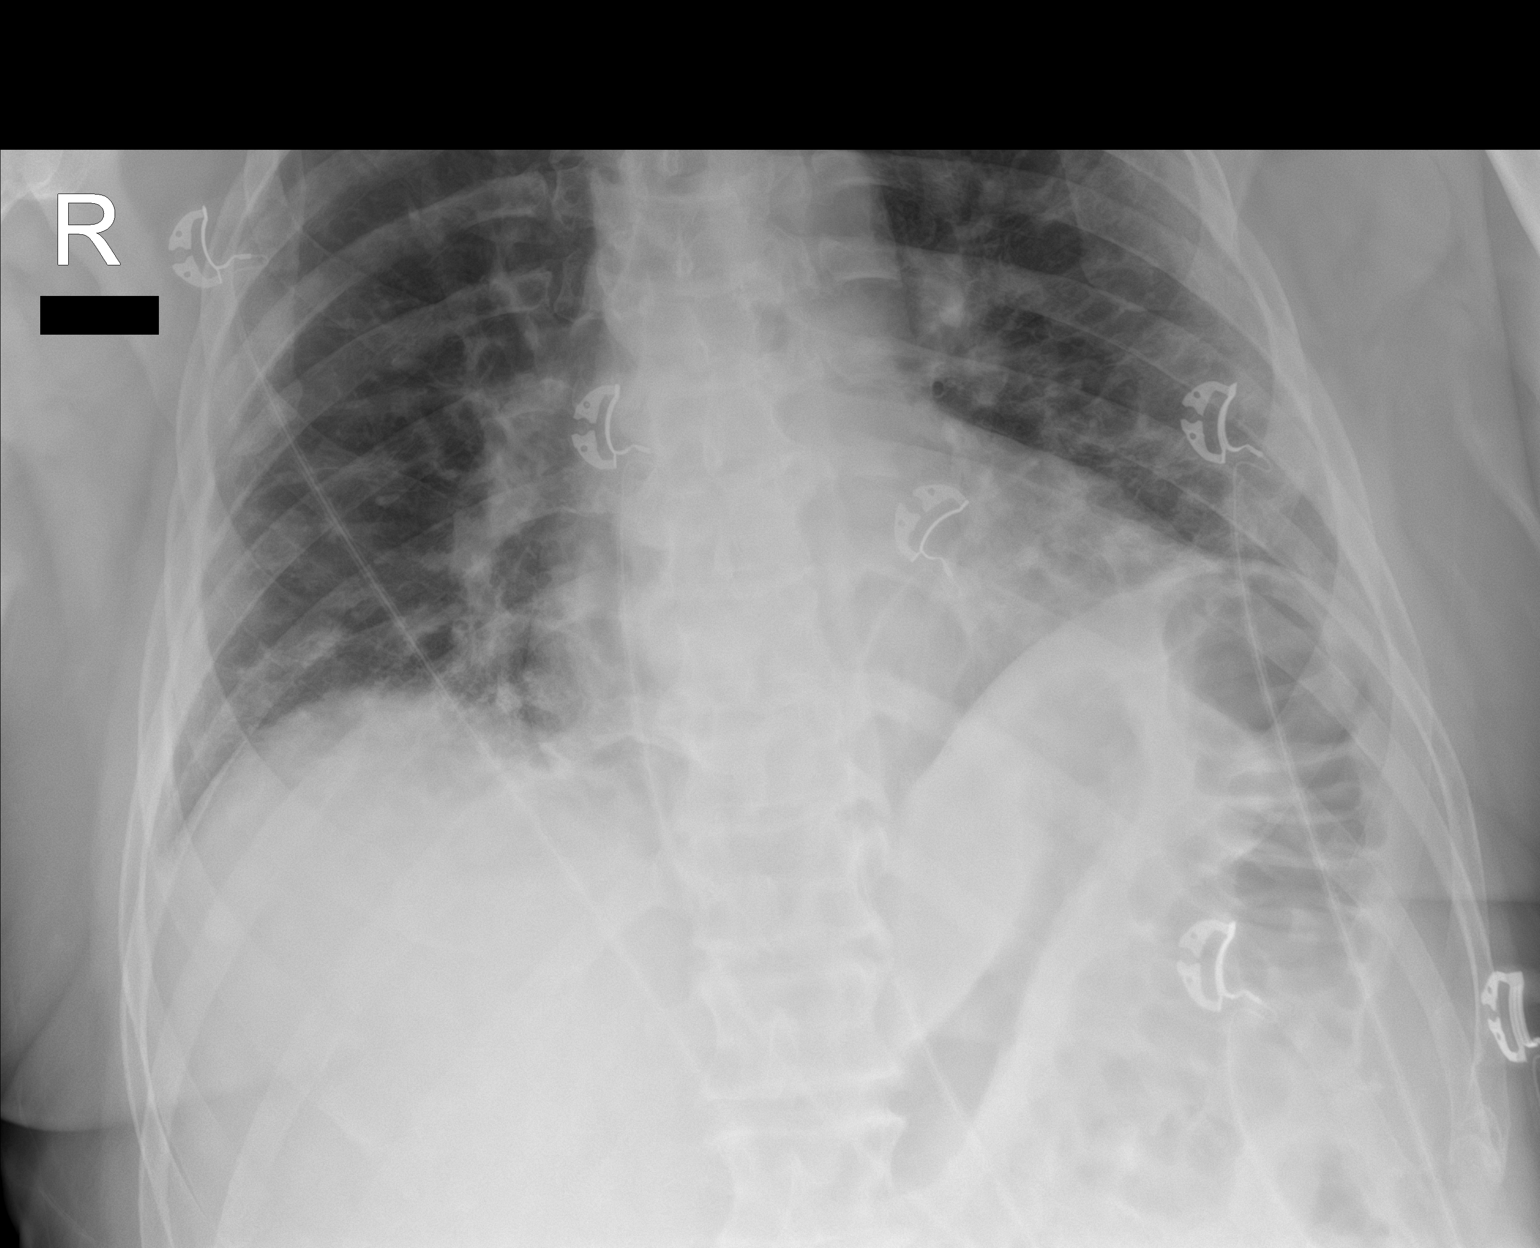

[2 of 2 positions shown; findings below may reference images not displayed]

FINDINGS: Cardiomediastinal contours are stable.

Patchy bilateral ill-defined nodular opacities with peripheral and
basilar predominance.

No signs of pleural effusion.

No acute bone finding.
IMPRESSION: Findings compatible with [id number] infection as provided in the
history.

## 2021-10-21 ENCOUNTER — Ambulatory Visit: Payer: Medicare Other

## 2021-10-21 ENCOUNTER — Ambulatory Visit (INDEPENDENT_AMBULATORY_CARE_PROVIDER_SITE_OTHER): Payer: Medicare Other | Admitting: Orthopedic Surgery

## 2021-10-21 DIAGNOSIS — M1711 Unilateral primary osteoarthritis, right knee: Secondary | ICD-10-CM | POA: Diagnosis not present

## 2021-10-21 DIAGNOSIS — Z96652 Presence of left artificial knee joint: Secondary | ICD-10-CM | POA: Diagnosis not present

## 2021-10-21 DIAGNOSIS — M1712 Unilateral primary osteoarthritis, left knee: Secondary | ICD-10-CM

## 2021-10-21 NOTE — Progress Notes (Signed)
FOLLOW UP  ? ?Encounter Diagnoses  ?Name Primary?  ? S/P total knee replacement, left 11/11/20   ? Arthritis of right knee Yes  ? Arthritis of left knee   ? ? ? ?Chief Complaint  ?Patient presents with  ? s/p total knee replacement left  ?  DOS 11/11/20  ? ? ? ?[name] is 1 year status post total knee replacement November 11, 2020 he is here for his annual x-ray ? ?The left knee is functioning well no problems with it so far ? ?His right knee has osteoarthritis as well he does complain of a recent injury where he felt something pop in the knee and he has pain in the anteromedial tibia ? ?He also notes tightness in the back of the knee ? ?Exam of the right knee shows he has excellent range of motion 125 degrees slight flexion contracture some tenderness over the pes tendon otherwise stable no effusion ? ?Left knee full extension and 120 degrees of flexion stable in extension as well mild laxity on the push pull test ? ?X-rays of the left knee show stable implant no loosening ? ?Left knee status post left total knee stable x-ray 1 year ? ?Right knee ice and Bengay for the pes tendon strain and the patient will decide for elective surgery when the knee is bothering him ? ? ?

## 2021-10-22 ENCOUNTER — Ambulatory Visit: Payer: Medicare Other | Admitting: Orthopedic Surgery

## 2021-10-28 ENCOUNTER — Telehealth: Payer: Self-pay | Admitting: Orthopedic Surgery

## 2021-10-28 ENCOUNTER — Other Ambulatory Visit: Payer: Self-pay

## 2021-10-28 MED ORDER — TRAMADOL HCL 50 MG PO TABS: 50.0000 mg | ORAL_TABLET | ORAL | 0 refills | Status: DC

## 2021-10-28 NOTE — Telephone Encounter (Signed)
Patient came in the office he forgot to ask for a refill on his medicine last week  ? ?traMADol (ULTRAM) 50 MG tablet  ? ?Pharmacy: [facility] in [place]  ? ?

## 2021-10-28 NOTE — Telephone Encounter (Signed)
Request sent to provider.

## 2021-12-17 ENCOUNTER — Ambulatory Visit: Payer: Medicare Other | Admitting: Orthopedic Surgery

## 2022-02-23 DIAGNOSIS — F32A Depression, unspecified: Secondary | ICD-10-CM | POA: Diagnosis not present

## 2022-02-23 DIAGNOSIS — R030 Elevated blood-pressure reading, without diagnosis of hypertension: Secondary | ICD-10-CM | POA: Diagnosis not present

## 2022-02-23 DIAGNOSIS — M1990 Unspecified osteoarthritis, unspecified site: Secondary | ICD-10-CM | POA: Diagnosis not present

## 2022-02-23 DIAGNOSIS — M109 Gout, unspecified: Secondary | ICD-10-CM | POA: Diagnosis not present

## 2022-02-23 DIAGNOSIS — E785 Hyperlipidemia, unspecified: Secondary | ICD-10-CM | POA: Diagnosis not present

## 2022-03-04 DIAGNOSIS — I480 Paroxysmal atrial fibrillation: Secondary | ICD-10-CM | POA: Diagnosis not present

## 2022-03-04 DIAGNOSIS — E785 Hyperlipidemia, unspecified: Secondary | ICD-10-CM | POA: Diagnosis not present

## 2022-03-04 DIAGNOSIS — Z23 Encounter for immunization: Secondary | ICD-10-CM | POA: Diagnosis not present

## 2022-03-04 DIAGNOSIS — R030 Elevated blood-pressure reading, without diagnosis of hypertension: Secondary | ICD-10-CM | POA: Diagnosis not present

## 2022-03-04 DIAGNOSIS — M1990 Unspecified osteoarthritis, unspecified site: Secondary | ICD-10-CM | POA: Diagnosis not present

## 2022-03-10 ENCOUNTER — Other Ambulatory Visit: Payer: Self-pay | Admitting: *Deleted

## 2022-03-10 NOTE — Patient Outreach (Signed)
  Care Coordination   03/10/2022 Name: [name] MRN: [id number] DOB: Apr 03, 1956   Care Coordination Outreach Attempts:  An unsuccessful telephone outreach was attempted today to offer the patient information about available care coordination services as a benefit of their health plan.   Follow Up Plan:  Additional outreach attempts will be made to offer the patient care coordination information and services.   Encounter Outcome:  No Answer  Care Coordination Interventions Activated:  No   Care Coordination Interventions:  No, not indicated    Valente David, RN, MSN, [facility] Care Coordinator [phone]

## 2022-03-15 ENCOUNTER — Other Ambulatory Visit: Payer: Self-pay | Admitting: *Deleted

## 2022-03-15 NOTE — Patient Outreach (Signed)
  Care Coordination   03/15/2022 Name: [name] MRN: [id number] DOB: [birth year]   Care Coordination Outreach Attempts:  A second unsuccessful outreach was attempted today to offer the patient with information about available care coordination services as a benefit of their health plan.     Follow Up Plan:  Additional outreach attempts will be made to offer the patient care coordination information and services.   Encounter Outcome:  Pt. Request to Call Back  Care Coordination Interventions Activated:  No   Care Coordination Interventions:  No, not indicated    Valente David, RN, MSN, [facility] Care Management Care Management Coordinator [phone]

## 2022-03-18 ENCOUNTER — Other Ambulatory Visit: Payer: Self-pay | Admitting: *Deleted

## 2022-03-18 NOTE — Patient Outreach (Signed)
  Care Coordination   03/18/2022 Name: [name] MRN: [id number] DOB: 1955-10-18   Care Coordination Outreach Attempts:  A third unsuccessful outreach was attempted today to offer the patient with information about available care coordination services as a benefit of their health plan.   Follow Up Plan:  No further outreach attempts will be made at this time. We have been unable to contact the patient to offer or enroll patient in care coordination services  Encounter Outcome:  No Answer  Care Coordination Interventions Activated:  No   Care Coordination Interventions:  No, not indicated    Valente David, RN, MSN, [facility] Care Management Care Management Coordinator [phone]

## 2022-05-25 DIAGNOSIS — R030 Elevated blood-pressure reading, without diagnosis of hypertension: Secondary | ICD-10-CM | POA: Diagnosis not present

## 2022-05-25 DIAGNOSIS — M65142 Other infective (teno)synovitis, left hand: Secondary | ICD-10-CM | POA: Diagnosis not present

## 2022-06-24 IMAGING — DX DG KNEE 1-2V PORT*L*
2 series · 2 of 2 positions shown · non-contrast
Comparison: 08/20/2020.

CLINICAL DATA: Postop total knee replacement.

EXAM:
PORTABLE LEFT KNEE - 1-2 VIEW

[knee ap]
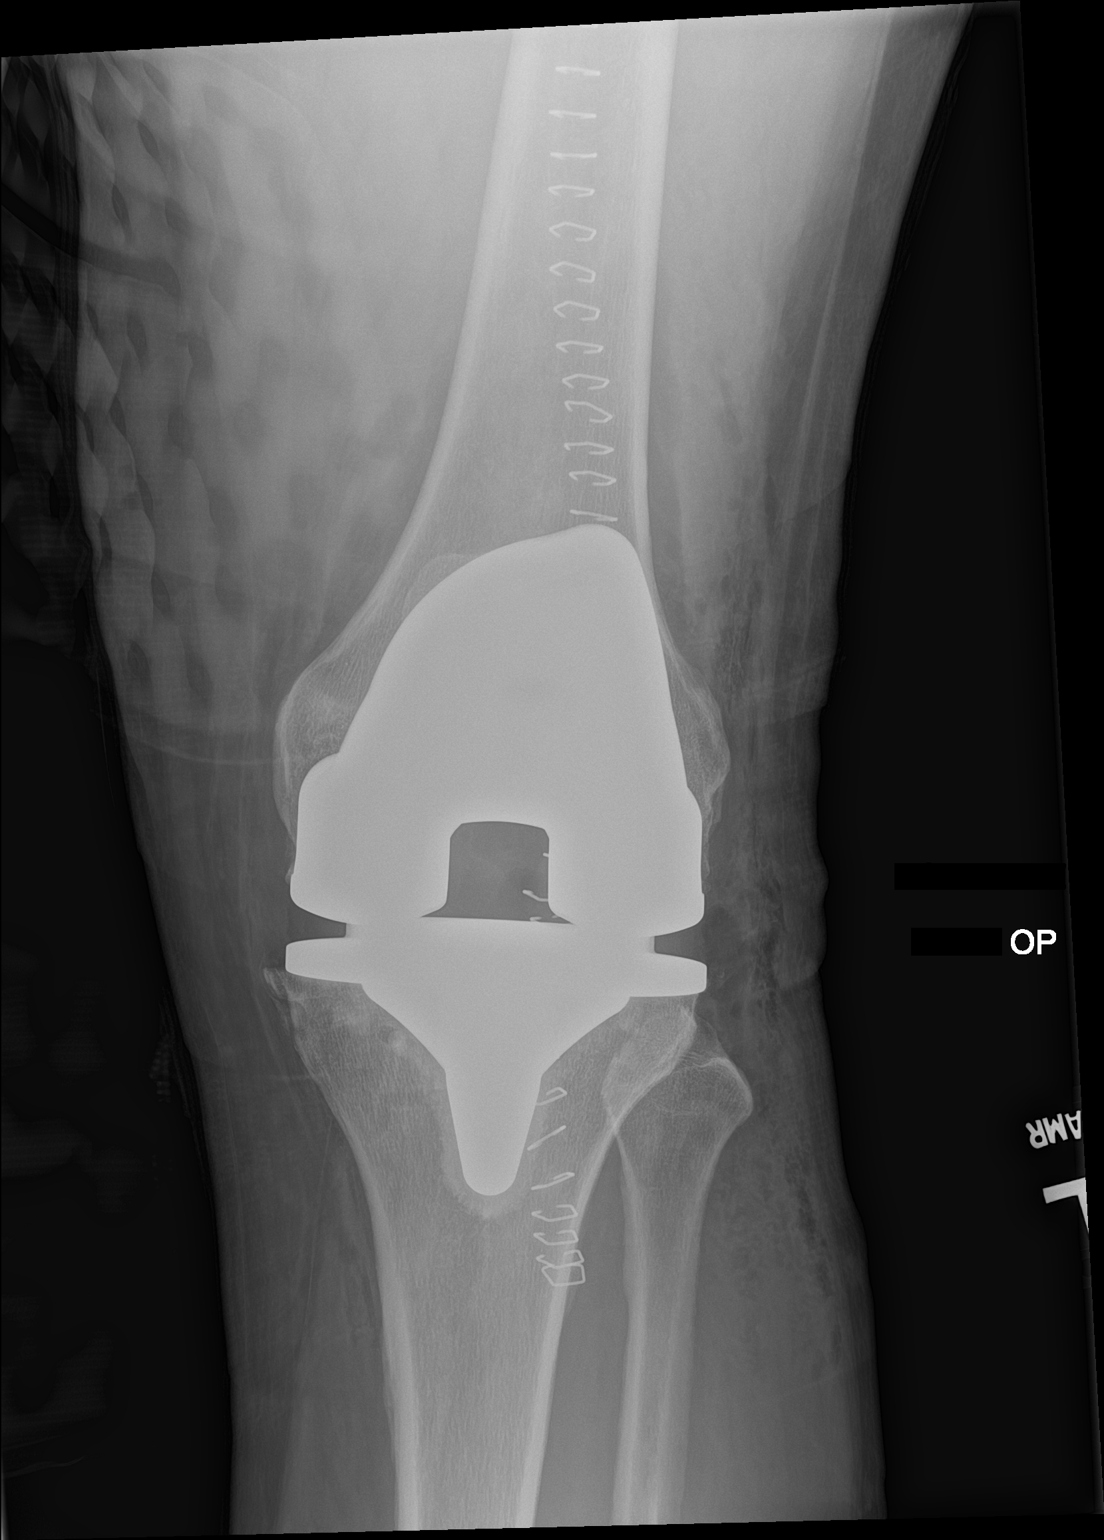

[knee lat]
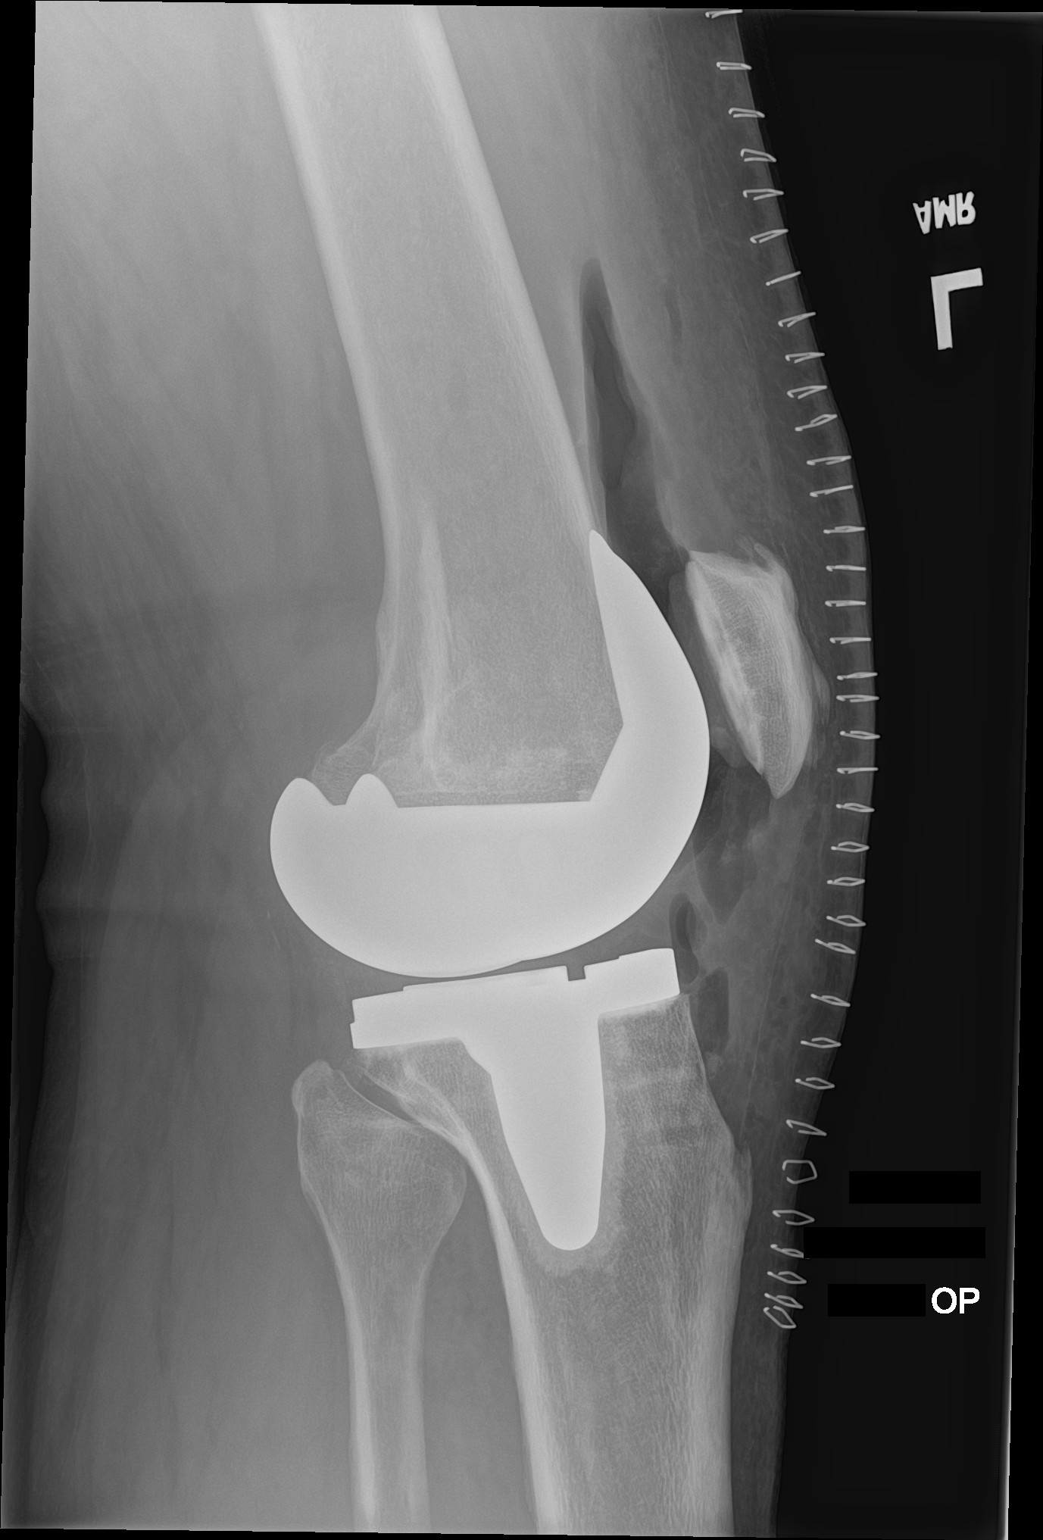

[2 of 2 positions shown; findings below may reference images not displayed]

FINDINGS: Total left knee replacement. Hardware intact. Anatomic alignment. No
acute bony abnormality.
IMPRESSION: Total left knee replacement with anatomic alignment.

## 2022-10-21 ENCOUNTER — Other Ambulatory Visit (INDEPENDENT_AMBULATORY_CARE_PROVIDER_SITE_OTHER): Payer: Medicare Other

## 2022-10-21 ENCOUNTER — Encounter: Payer: Self-pay | Admitting: Orthopedic Surgery

## 2022-10-21 ENCOUNTER — Ambulatory Visit: Payer: Medicare Other | Admitting: Orthopedic Surgery

## 2022-10-21 VITALS — BP 149/79 | HR 82 | Ht 68.0 in | Wt 212.0 lb

## 2022-10-21 DIAGNOSIS — Z96652 Presence of left artificial knee joint: Secondary | ICD-10-CM | POA: Diagnosis not present

## 2022-10-21 DIAGNOSIS — M1711 Unilateral primary osteoarthritis, right knee: Secondary | ICD-10-CM

## 2022-10-21 DIAGNOSIS — M1712 Unilateral primary osteoarthritis, left knee: Secondary | ICD-10-CM

## 2022-10-21 NOTE — Progress Notes (Signed)
Chief Complaint  Patient presents with   Knee Pain    Bilateral/ states right knee painful today     Annual follow-up left total knee done on Dec 11, 2020 other than some crepitance that left knee is functioning well however  The right knee is more painful he is requiring wrap with an Ace wrap to control his pain  Examination of the left knee exhibits a range of motion arc of 125 degrees and it is stable.  There is some crepitation in the patellofemoral area  On the right knee has tenderness over the medial compartment the knee is in varus alignment it is otherwise stable with good quadriceps control skin is normal  Imaging shows a stable left total knee with no signs of loosening or malalignment  The right knee shows grade 4 arthritis  We discussed possible surgical intervention at this time and the patient would like to have surgery in September.  I will see him in August for preop for right total knee  Encounter Diagnoses  Name Primary?   Arthritis of right knee Yes   Arthritis of left knee    S/P total knee replacement, left 11/11/20

## 2022-10-22 ENCOUNTER — Ambulatory Visit: Payer: Medicare Other | Admitting: Orthopedic Surgery

## 2022-10-27 ENCOUNTER — Ambulatory Visit: Payer: Medicare Other | Admitting: Orthopedic Surgery

## 2023-02-17 ENCOUNTER — Ambulatory Visit: Payer: Medicare Other | Admitting: Orthopedic Surgery

## 2023-02-17 VITALS — BP 136/73 | HR 82 | Ht 68.0 in | Wt 212.0 lb

## 2023-02-17 DIAGNOSIS — M1711 Unilateral primary osteoarthritis, right knee: Secondary | ICD-10-CM | POA: Diagnosis not present

## 2023-02-17 DIAGNOSIS — Z01818 Encounter for other preprocedural examination: Secondary | ICD-10-CM

## 2023-02-17 NOTE — Patient Instructions (Signed)
Your surgery will be at [facility] by Dr Harrison  plan to be in hospital overnight. The hospital will contact you with a preoperative appointment to discuss Anesthesia.  Please arrive on time or 15 minutes early for the preoperative appointment, they have a very tight schedule if you are late or do not come in your surgery will be cancelled.  The phone number for the preop area is [phone]. Please bring your medications with you for the appointment. They will tell you the arrival time for surgery and medication instructions when you have your preoperative evaluation. Do not wear nail polish the day of your surgery and if you take Phentermine you need to stop this medication ONE WEEK prior to your surgery. f you take Invokana, Farxiga, Jardiance, or Steglatro) - Hold 72 hours before the procedure.  If you take Ozempic,  Bydureon or Trulicity do not take for 8 days before your surgery. If you take Victoza, Rybelsis, Saxenda or Adlyxi stop 24 hours before the procedure. Please arrive at the hospital 2 hours before procedure if scheduled at 9:30 or later in the day or at the time the nurse tells you at your preoperative visit.   If you have my chart do not use the time given in my chart use the time given to you by the nurse during your preoperative visit.   Your surgery  time may change. Please be available for phone calls the day of your surgery and the day before. The Short Stay department may need to discuss changes about your surgery time. Not reaching the you could lead to procedure delays and possible cancellation.  You must have a ride home and someone to stay with you for 24 to 48 hours. The person taking you home will receive and sign for the your discharge instructions.  Please be prepared to give your support person's name and telephone number to Central Registration. Dr Harrison will need that name and phone number post procedure.   You will also get a call from a representative of [employer], they have a machine that you will use in the first few weeks after surgery. It is called a CPM.   You will have home physical therapy for 2 weeks after surgery, the home health agency will call you before or just following the surgery to set up visits. [facility] is the agency we normally use, unless you request another agency.   You will get a call also from outpatient therapy for therapy starting when the home therapy is done.  If you have questions or need to Reschedule the surgery, call the office ask for Amy.    

## 2023-02-17 NOTE — Addendum Note (Signed)
Addended byCaffie Damme on: 02/17/2023 03:49 PM   Modules accepted: Orders

## 2023-02-17 NOTE — Progress Notes (Signed)
Chief Complaint  Patient presents with   Knee Pain    Right knee pain    67 year old male preop for right total knee arthroplasty status post left total knee arthroplasty in April 2022  History of C-spine fusion history of lumbar fusion  Also has sleep apnea hyperlipidemia  On phentermine asked to stop it prior to surgery  He has disabling knee pain as we have noted on prior visits  He wants to have his knee replaced sometime in late August or September  Currently his review of systems is negative  Problem list, medical hx, medications and allergies reviewed   Past Medical History:  Diagnosis Date   Anxiety    some panic attacks- off medications   Arthritis    Complication of anesthesia    woke up during surgery- Elbow surgery, 2009 and again with foot surgeryry    Depression    GERD (gastroesophageal reflux disease)    once in awhile   Hypercholesteremia    Sleep apnea    has lost 'abunch of weight' its been yrs since i used it   Past Surgical History:  Procedure Laterality Date   ANTERIOR CERVICAL DECOMP/DISCECTOMY FUSION N/A 12/21/2017   Procedure: C3-4 ANTERIOR CERVICAL DECOMPRESSION/DISCECTOMY FUSION, ALLOGRAFT, PLATE;  Surgeon: Eldred Manges, MD;  Location[facility];  Service: Orthopedics;  Laterality: N/A;   CATARACT EXTRACTION Right    CATARACT EXTRACTION Left    COLONOSCOPY  2008   Dr. Darrick Penna: Hyperplastic polyp removed, diverticulosis.   COLONOSCOPY WITH PROPOFOL N/A 07/11/2018   Procedure: COLONOSCOPY WITH PROPOFOL;  Surgeon: West Bali, MD;  [facility];  Service: Endoscopy;  Laterality: N/A;  7:30am   FOOT SURGERY     left elbow      LUMBAR LAMINECTOMY     back in 2015   MASS EXCISION Left 09/12/2019   Procedure: EXCISION GOUTY TOPHI;  Surgeon: Ferman Hamming, DPM;  [facility];  Service: Podiatry;  Laterality: Left;   POLYPECTOMY  07/11/2018   Procedure: POLYPECTOMY;  Surgeon: West Bali, MD;  [facility];   Service: Endoscopy;;  colon   TOTAL KNEE ARTHROPLASTY Left 11/11/2020   Procedure: LEFT TOTAL KNEE ARTHROPLASTY;  Surgeon: Vickki Hearing, MD;  Location[facility];  Service: Orthopedics;  Laterality: Left;   Problem list, medical hx, medications and allergies reviewed  Social History   Tobacco Use   Smoking status: Former    Types: Cigarettes   Smokeless tobacco: Never  Vaping Use   Vaping status: Never Used  Substance Use Topics   Alcohol use: No    Comment: recover alcolol 1985   Drug use: No   Allergies  Allergen Reactions   Codeine Other (See Comments)    Out of right state of mind.    Meloxicam Itching     BP 136/73   Pulse 82   Ht 5\' 8"  (1.727 m)   Wt 212 lb (96.2 kg)   BMI 32.23 kg/m   Physical Exam Vitals and nursing note reviewed.  Constitutional:      General: He is not in acute distress.    Appearance: Normal appearance. He is not ill-appearing, toxic-appearing or diaphoretic.  HENT:     Head: Normocephalic and atraumatic.     Nose: No congestion or rhinorrhea.     Mouth/Throat:     Pharynx: No oropharyngeal exudate.  Eyes:     General: No scleral icterus.       Right eye: No discharge.  Left eye: No discharge.     Extraocular Movements: Extraocular movements intact.     Pupils: Pupils are equal, round, and reactive to light.  Cardiovascular:     Rate and Rhythm: Normal rate.     Pulses: Normal pulses.  Pulmonary:     Effort: Pulmonary effort is normal.     Breath sounds: No stridor.  Abdominal:     General: Abdomen is flat. There is no distension.     Palpations: Abdomen is soft.  Musculoskeletal:     Right lower leg: No edema.     Left lower leg: No edema.     Comments: Right knee skin envelope is amenable to surgery  He is tender over the medial lateral joint lines with no effusion  He has full extension and full flexion of his knee up to 125 degrees  There is no instability muscle tone and strength are normal neurovascular exam  is intact in the right lower extremity  Left knee has a previous total knee functioning well  Skin:    General: Skin is warm.     Capillary Refill: Capillary refill takes less than 2 seconds.     Findings: No bruising, ecchymosis or erythema.  Neurological:     Mental Status: He is alert and oriented to person, place, and time.     Sensory: No sensory deficit.     Deep Tendon Reflexes: Reflexes are normal and symmetric.  Psychiatric:        Mood and Affect: Mood normal.        Behavior: Behavior normal.        Thought Content: Thought content normal.        Judgment: Judgment normal.    Last imaging was in April 2024  Alignment slight varus I would say with osteophytes medially narrowing and sclerosis medially as well there is also a small lateral osteophyte posteriorly small osteophyte on the tibial side nothing really on the femur there is some calcifications in the quadriceps tendon  He has a well-functioning left total knee arthroplasty as well  The procedure has been fully reviewed with the patient; The risks and benefits of surgery have been discussed and explained and understood. Alternative treatment has also been reviewed, questions were encouraged and answered. The postoperative plan is also been reviewed.  Right total knee arthroplasty PREFERS GENERAL ANESTHESIA

## 2023-02-17 NOTE — Addendum Note (Signed)
Addended byCaffie Damme on: 02/17/2023 03:40 PM   Modules accepted: Orders

## 2023-02-21 ENCOUNTER — Telehealth: Payer: Self-pay | Admitting: Orthopedic Surgery

## 2023-02-21 NOTE — Telephone Encounter (Signed)
Dr. Mort Sawyers pt - pt lvm that he wants to reschedule his surgery.  [phone]

## 2023-02-23 ENCOUNTER — Telehealth: Payer: Self-pay | Admitting: Orthopedic Surgery

## 2023-02-23 NOTE — Telephone Encounter (Signed)
DR. Ginette Otto,   Harvie Heck came in the office to talk with you, he states he has to reschedule his surgery please call him back.

## 2023-02-23 NOTE — Telephone Encounter (Signed)
Left message for him to call back we will pick another day told him several are open just to call back

## 2023-02-24 NOTE — Telephone Encounter (Signed)
Dr. Mort Sawyers pt - pt lvm for Amy, he stated that he would like to reschedule his surgery for any Tuesday the middle of September or the end of September.

## 2023-02-25 NOTE — Telephone Encounter (Signed)
Amy,   Harvie Heck called and wants to get a message to you. If she can give him the last Tuesday in September would be great.   Please call him back at [phone]

## 2023-03-01 NOTE — Telephone Encounter (Signed)
Called him we RS to 04/12/23

## 2023-03-29 ENCOUNTER — Other Ambulatory Visit: Payer: Self-pay | Admitting: Orthopedic Surgery

## 2023-03-29 DIAGNOSIS — M1711 Unilateral primary osteoarthritis, right knee: Secondary | ICD-10-CM

## 2023-04-06 ENCOUNTER — Encounter (HOSPITAL_COMMUNITY)
Admission: RE | Admit: 2023-04-06 | Discharge: 2023-04-06 | Disposition: A | Discharge status: Home / Self Care | Payer: Medicare Other | Source: Ambulatory Visit | Attending: Orthopedic Surgery | Admitting: Orthopedic Surgery

## 2023-04-06 ENCOUNTER — Encounter (HOSPITAL_COMMUNITY): Payer: Self-pay

## 2023-04-06 VITALS — BP 150/80 | HR 69 | Temp 97.8°F | Resp 18 | Ht 68.0 in | Wt 212.1 lb

## 2023-04-06 DIAGNOSIS — I493 Ventricular premature depolarization: Secondary | ICD-10-CM | POA: Diagnosis not present

## 2023-04-06 DIAGNOSIS — G4733 Obstructive sleep apnea (adult) (pediatric): Secondary | ICD-10-CM | POA: Diagnosis not present

## 2023-04-06 DIAGNOSIS — Z01818 Encounter for other preprocedural examination: Secondary | ICD-10-CM | POA: Diagnosis not present

## 2023-04-06 DIAGNOSIS — M1711 Unilateral primary osteoarthritis, right knee: Secondary | ICD-10-CM | POA: Diagnosis not present

## 2023-04-06 LAB — CBC WITH DIFFERENTIAL/PLATELET
Abs Immature Granulocytes: 0.02 10*3/uL (ref 0.00–0.07)
Basophils Absolute: 0.1 10*3/uL (ref 0.0–0.1)
Basophils Relative: 1 %
Eosinophils Absolute: 0.2 10*3/uL (ref 0.0–0.5)
Eosinophils Relative: 2 %
HCT: 41.8 % (ref 39.0–52.0)
Hemoglobin: 14.2 g/dL (ref 13.0–17.0)
Immature Granulocytes: 0 %
Lymphocytes Relative: 32 %
Lymphs Abs: 2.3 10*3/uL (ref 0.7–4.0)
MCH: 29 pg (ref 26.0–34.0)
MCHC: 34 g/dL (ref 30.0–36.0)
MCV: 85.3 fL (ref 80.0–100.0)
Monocytes Absolute: 0.6 10*3/uL (ref 0.1–1.0)
Monocytes Relative: 9 %
Neutro Abs: 3.9 10*3/uL (ref 1.7–7.7)
Neutrophils Relative %: 56 %
Platelets: 258 10*3/uL (ref 150–400)
RBC: 4.9 MIL/uL (ref 4.22–5.81)
RDW: 13.3 % (ref 11.5–15.5)
WBC: 7 10*3/uL (ref 4.0–10.5)
nRBC: 0 % (ref 0.0–0.2)

## 2023-04-06 LAB — BASIC METABOLIC PANEL WITH GFR
Anion gap: 12 (ref 5–15)
BUN: 13 mg/dL (ref 8–23)
CO2: 23 mmol/L (ref 22–32)
Calcium: 9.3 mg/dL (ref 8.9–10.3)
Chloride: 100 mmol/L (ref 98–111)
Creatinine, Ser: 0.86 mg/dL (ref 0.61–1.24)
GFR, Estimated: >60 mL/min (ref >60)
Glucose, Bld: 104 mg/dL — ABNORMAL HIGH (ref 70–99)
Potassium: 4.3 mmol/L (ref 3.5–5.1)
Sodium: 135 mmol/L (ref 135–145)

## 2023-04-06 LAB — PREPARE RBC (CROSSMATCH)

## 2023-04-06 LAB — SURGICAL PCR SCREEN
MRSA, PCR: NEGATIVE
Staphylococcus aureus: NEGATIVE

## 2023-04-06 NOTE — Patient Instructions (Addendum)
[name]  04/06/2023     @PREFPERIOPPHARMACY @   Your procedure is scheduled on  04/12/2023.   Report to [facility] at  0600 A.M.   Call this number if you have problems the morning of surgery:  [phone]  If you experience any cold or flu symptoms such as cough, fever, chills, shortness of breath, etc. between now and your scheduled surgery, please notify us at the above number.   Remember:  Do not eat or drink after midnight.         Your last dose of phentermine should have been on 9/16.      Take these medicines the morning of surgery with A SIP OF WATER                                              None.    Do not wear jewelry, make-up or nail polish, including gel polish,  artificial nails, or any other type of covering on natural nails (fingers and  toes).  Do not wear lotions, powders, or perfumes, or deodorant.  Do not shave 48 hours prior to surgery.  Men may shave face and neck.  Do not bring valuables to the hospital.  [facility] is not responsible for any belongings or valuables.  Contacts, dentures or bridgework may not be worn into surgery.  Leave your suitcase in the car.  After surgery it may be brought to your room.  For patients admitted to the hospital, discharge time will be determined by your treatment team.  Patients discharged the day of surgery will not be allowed to drive home.    Special instructions:   DO NOT smoke tobacco or vape for 24 hours before your procedure.  Please read over the following fact sheets that you were given. Pain Booklet, Coughing and Deep Breathing, Blood Transfusion Information, Total Joint Packet, MRSA Information, Surgical Site Infection Prevention, Anesthesia Post-op Instructions, and Care and Recovery After Surgery        Total Knee Replacement, Care After This sheet gives you information about how to care for yourself after your procedure. Your health care provider may also give you more  specific instructions. If you have problems or questions, contact your health care provider. What can I expect after the procedure? After the procedure, it is common to have: Redness, pain, and swelling at the incision area. Stiffness. Discomfort. A small amount of blood or clear fluid coming from your incision. Follow these instructions at home: Medicines Take over-the-counter and prescription medicines only as told by your health care provider. If you were prescribed a blood thinner (anticoagulant), take it as told by your health care provider. Ask your health care provider if the medicine prescribed to you: Requires you to avoid driving or using machinery. Can cause constipation. You may need to take these actions to prevent or treat constipation: Drink enough fluid to keep your urine pale yellow. Take over-the-counter or prescription medicines. Eat foods that are high in fiber, such as beans, whole grains, and fresh fruits and vegetables. Limit foods that are high in fat and processed sugars, such as fried or sweet foods. Incision care  Follow instructions from your health care provider about how to take care of your incision. Make sure you: Wash your hands with soap and water for at least 20 seconds  before and after you change your bandage (dressing). If soap and water are not available, use hand sanitizer. Change your dressing as told by your health care provider. Leave stitches (sutures), staples, skin glue, or adhesive strips in place. These skin closures may need to stay in place for 2 weeks or longer. If adhesive strip edges start to loosen and curl up, you may trim the loose edges. Do not remove adhesive strips completely unless your health care provider tells you to do that. Do not take baths, swim, or use a hot tub until your health care provider approves. Check your incision area every day for signs of infection. Check for: More redness, swelling, or pain. More fluid or  blood. Warmth. Pus or a bad smell. Activity Rest as told by your health care provider. Avoid sitting for a long time without moving. Get up to take short walks every 1-2 hours. This is important to improve blood flow and breathing. Ask for help if you feel weak or unsteady. Follow instructions from your health care provider about using a walker, crutches, or a cane. You may use your legs to support (bear) your body weight as told by your health care provider. Follow instructions about how much weight you may safely support on your affected leg (weight-bearing restrictions). A physical therapist may show you how to get out of a bed and chair and how to go up and down stairs. You will first do this with a walker, crutches, or a cane and then without any of these devices. Once you are able to walk without a limp, you may stop using a walker, crutches, or a cane. Do exercises as told by your health care provider or physical therapist. Avoid high-impact activities, including running, jumping rope, and doing jumping jacks. Do not play contact sports until your health care provider approves. Return to your normal activities as told by your health care provider. Ask your health care provider what activities are safe for you. Managing pain, stiffness, and swelling  If directed, put ice on your knee. To do this: Put ice in a plastic bag or use the icing device (cold flow pad) that you were given. Follow instructions from your health care provider about how to use the icing device. Place a towel between your skin and the bag or between your skin and the icing device. Leave the ice on for 20 minutes, 2-3 times a day. Remove the ice if your skin turns bright red. This is very important. If you cannot feel pain, heat, or cold, you have a greater risk of damage to the area. Move your toes often to reduce stiffness and swelling. Raise (elevate) your leg above the level of your heart while you are sitting or  lying down. Use several pillows to keep your leg straight. Do not put a pillow just under the knee. If the knee is bent for a long time, this may lead to stiffness. Wear elastic knee support as told by your health care provider. Safety  To help prevent falls, keep floors clear of objects you may trip over. Place items that you may need within easy reach. Wear an apron or tool belt with pockets for carrying objects. This leaves your hands free to help with your balance. Ask your health care provider when it is safe to drive. General instructions Wear compression stockings as told by your health care provider. These stockings help to prevent blood clots and reduce swelling in your legs. Continue with breathing  exercises. This helps prevent lung infection. Do not use any products that contain nicotine or tobacco. These products include cigarettes, chewing tobacco, and vaping devices, such as e-cigarettes. These can delay healing after surgery. If you need help quitting, ask your health care provider. Tell your health care provider if you plan to have dental work. Also: Tell your dentist about your joint replacement. Ask your health care provider if there are any special instructions you need to follow before having dental care and routine cleanings. Keep all follow-up visits. This is important. Contact a health care provider if: You have a fever or chills. You have a cough or feel short of breath. Your medicine is not controlling your pain. You have any of these signs of infection: More redness, swelling, or pain around your incision. More fluid or blood coming from your incision. Warmth coming from your incision. Pus or a bad smell coming from your incision. You fall. Get help right away if: You have severe pain. You have trouble breathing. You have chest pain. You have redness, swelling, pain, or warmth in your calf or leg. Your incision breaks open after sutures or staples are  removed. These symptoms may represent a serious problem that is an emergency. Do not wait to see if the symptoms will go away. Get medical help right away. Call your local emergency services (911 in the [place] Do not drive yourself to the hospital. Summary After the procedure, it is common to have pain and swelling at the incision area, a small amount of blood or fluid coming from your incision, and stiffness. Follow instructions from your health care provider about how to take care of your incision. Use crutches, a walker, or a cane as told by your health care provider. This information is not intended to replace advice given to you by your health care provider. Make sure you discuss any questions you have with your health care provider. Document Revised: 12/24/2019 Document Reviewed: 12/25/2019 Elsevier Patient Education  2024 [employer]. General Anesthesia, Adult, Care After The following information offers guidance on how to care for yourself after your procedure. Your health care provider may also give you more specific instructions. If you have problems or questions, contact your health care provider. What can I expect after the procedure? After the procedure, it is common for people to: Have pain or discomfort at the IV site. Have nausea or vomiting. Have a sore throat or hoarseness. Have trouble concentrating. Feel cold or chills. Feel weak, sleepy, or tired (fatigue). Have soreness and body aches. These can affect parts of the body that were not involved in surgery. Follow these instructions at home: For the time period you were told by your health care provider:  Rest. Do not participate in activities where you could fall or become injured. Do not drive or use machinery. Do not drink alcohol. Do not take sleeping pills or medicines that cause drowsiness. Do not make important decisions or sign legal documents. Do not take care of children on your own. General  instructions Drink enough fluid to keep your urine pale yellow. If you have sleep apnea, surgery and certain medicines can increase your risk for breathing problems. Follow instructions from your health care provider about wearing your sleep device: Anytime you are sleeping, including during daytime naps. While taking prescription pain medicines, sleeping medicines, or medicines that make you drowsy. Return to your normal activities as told by your health care provider. Ask your health care provider what activities are  safe for you. Take over-the-counter and prescription medicines only as told by your health care provider. Do not use any products that contain nicotine or tobacco. These products include cigarettes, chewing tobacco, and vaping devices, such as e-cigarettes. These can delay incision healing after surgery. If you need help quitting, ask your health care provider. Contact a health care provider if: You have nausea or vomiting that does not get better with medicine. You vomit every time you eat or drink. You have pain that does not get better with medicine. You cannot urinate or have bloody urine. You develop a skin rash. You have a fever. Get help right away if: You have trouble breathing. You have chest pain. You vomit blood. These symptoms may be an emergency. Get help right away. Call 911. Do not wait to see if the symptoms will go away. Do not drive yourself to the hospital. Summary After the procedure, it is common to have a sore throat, hoarseness, nausea, vomiting, or to feel weak, sleepy, or fatigue. For the time period you were told by your health care provider, do not drive or use machinery. Get help right away if you have difficulty breathing, have chest pain, or vomit blood. These symptoms may be an emergency. This information is not intended to replace advice given to you by your health care provider. Make sure you discuss any questions you have with your health  care provider. Document Revised: 10/02/2021 Document Reviewed: 10/02/2021 Elsevier Patient Education  2024 [employer].

## 2023-04-08 DIAGNOSIS — M1990 Unspecified osteoarthritis, unspecified site: Secondary | ICD-10-CM | POA: Diagnosis not present

## 2023-04-08 DIAGNOSIS — E785 Hyperlipidemia, unspecified: Secondary | ICD-10-CM | POA: Diagnosis not present

## 2023-04-11 NOTE — H&P (Signed)
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   History of C-spine fusion history of lumbar fusion   Also has sleep apnea hyperlipidemia   On phentermine asked to stop it prior to surgery   He has disabling knee pain as we have noted on prior visits     Currently his review of systems is negative   Problem list, medical hx, medications and allergies reviewed    Family History  Problem Relation Age of Onset   Heart disease Mother    Heart disease Father    Other Cousin        colostomy   Brain cancer Paternal Uncle    Lung cancer Brother         Past Medical History:  Diagnosis Date   Anxiety      some panic attacks- off medications   Arthritis     Complication of anesthesia      woke up during surgery- Elbow surgery, 2009 and again with foot surgeryry    Depression     GERD (gastroesophageal reflux disease)      once in awhile   Hypercholesteremia     Sleep apnea      has lost 'abunch of weight' its been yrs since i used it             Past Surgical History:  Procedure Laterality Date   ANTERIOR CERVICAL DECOMP/DISCECTOMY FUSION N/A 12/21/2017    Procedure: C3-4 ANTERIOR CERVICAL DECOMPRESSION/DISCECTOMY FUSION, ALLOGRAFT, PLATE;  Surgeon: Eldred Manges, MD;  Location: [facility] OR;  Service: Orthopedics;  Laterality: N/A;   CATARACT EXTRACTION Right     CATARACT EXTRACTION Left     COLONOSCOPY   2008    Dr. Darrick Penna: Hyperplastic polyp removed, diverticulosis.   COLONOSCOPY WITH PROPOFOL N/A 07/11/2018    Procedure: COLONOSCOPY WITH PROPOFOL;  Surgeon: West Bali, MD;  Location: [facility] SUITE;  Service: Endoscopy;  Laterality: N/A;  7:30am   FOOT SURGERY       left elbow         LUMBAR LAMINECTOMY        back in 2015   MASS EXCISION Left 09/12/2019    Procedure: EXCISION GOUTY TOPHI;  Surgeon: Ferman Hamming, DPM;  Location: [facility] ORS;  Service: Podiatry;  Laterality: Left;   POLYPECTOMY   07/11/2018    Procedure: POLYPECTOMY;  Surgeon: West Bali, MD;  Location: [facility];  Service: Endoscopy;;  colon   TOTAL KNEE ARTHROPLASTY Left 11/11/2020    Procedure: LEFT TOTAL KNEE ARTHROPLASTY;  Surgeon: Vickki Hearing, MD;  Location: [facility] ORS;  Service: Orthopedics;  Laterality: Left;        Problem list, medical hx, medications and allergies reviewed  Social History  Social History         Tobacco Use   Smoking status: Former      Types: Cigarettes   Smokeless tobacco: Never  Vaping Use   Vaping status: Never Used  Substance Use Topics   Alcohol use: No      Comment: recover alcolol 1985   Drug use: No      Allergies  Allergies  Allergen Reactions   Codeine Other (See Comments)      Out of right state of mind.    Meloxicam Itching       No current facility-administered medications for this encounter.  Current Outpatient Medications:    atorvastatin (LIPITOR) 40 MG tablet, Take 40 mg by mouth at bedtime. , Disp: , Rfl:    escitalopram (LEXAPRO) 20 MG tablet, Take 20 mg by mouth every evening. , Disp: , Rfl:    phentermine 37.5 MG capsule, Take 37.5 mg by mouth every morning., Disp: , Rfl: stop before surgery     BP 136/73   Pulse 82   Ht 5\' 8"  (1.727 m)   Wt 212 lb (96.2 kg)   BMI 32.23 kg/m    Physical Exam Vitals and nursing note reviewed.  Constitutional:      General: He is not in acute distress.    Appearance: Normal appearance. He is not ill-appearing, toxic-appearing or diaphoretic.  HENT:     Head: Normocephalic and atraumatic.     Nose: No congestion or rhinorrhea.     Mouth/Throat:     Pharynx: No oropharyngeal exudate.  Eyes:     General: No scleral icterus.       Right eye: No discharge.        Left eye:  No discharge.     Extraocular Movements: Extraocular movements intact.     Pupils: Pupils are equal, round, and reactive to light.  Cardiovascular:     Rate and Rhythm: Normal rate.     Pulses: Normal pulses.  Pulmonary:     Effort: Pulmonary effort is normal.     Breath sounds: No stridor.  Abdominal:     General: Abdomen is flat. There is no distension.     Palpations: Abdomen is soft.  Musculoskeletal:     Right lower leg: No edema.     Left lower leg: No edema.     Comments: Right knee skin envelope is amenable to surgery   He is tender over the medial lateral joint lines with no effusion   He has full extension and full flexion of his knee up to 125 degrees   There is no instability muscle tone and strength are normal neurovascular exam is intact in the right lower extremity   Left knee has a previous total knee functioning well  Skin:    General: Skin is warm.     Capillary Refill: Capillary refill takes less than 2 seconds.     Findings: No bruising, ecchymosis or erythema.  Neurological:     Mental Status: He is alert and oriented to person, place, and time.     Sensory: No sensory deficit.     Deep Tendon Reflexes: Reflexes are normal and symmetric.  Psychiatric:        Mood and Affect: Mood normal.        Behavior: Behavior normal.        Thought Content: Thought content normal.        Judgment: Judgment normal.      Last imaging was in April 2024   Alignment slight varus I would say with osteophytes medially narrowing and sclerosis medially as well there is also a small lateral osteophyte posteriorly small osteophyte on the tibial side nothing really on the femur there is some calcifications in the quadriceps tendon   He has a well-functioning left total knee arthroplasty as well   The procedure has been fully reviewed with the  patient; The risks and benefits of surgery have been discussed and explained and understood. Alternative treatment has also been  reviewed, questions were encouraged and answered. The postoperative plan is also been reviewed.   Right total knee arthroplasty PREFERS GENERAL ANESTHESIA

## 2023-04-12 ENCOUNTER — Ambulatory Visit (HOSPITAL_BASED_OUTPATIENT_CLINIC_OR_DEPARTMENT_OTHER): Payer: Medicare Other | Admitting: Certified Registered"

## 2023-04-12 ENCOUNTER — Observation Stay (HOSPITAL_COMMUNITY)
Admission: RE | Admit: 2023-04-12 | Discharge: 2023-04-13 | Disposition: A | Discharge status: Home Health | Payer: Medicare Other | Attending: Orthopedic Surgery | Admitting: Orthopedic Surgery

## 2023-04-12 ENCOUNTER — Encounter (HOSPITAL_COMMUNITY): Payer: Self-pay | Admitting: Orthopedic Surgery

## 2023-04-12 ENCOUNTER — Encounter (HOSPITAL_COMMUNITY)
Admission: RE | Disposition: A | Discharge status: Home Health | Payer: Self-pay | Source: Home / Self Care | Attending: Orthopedic Surgery

## 2023-04-12 ENCOUNTER — Ambulatory Visit (HOSPITAL_COMMUNITY): Payer: Medicare Other | Admitting: Certified Registered"

## 2023-04-12 ENCOUNTER — Ambulatory Visit (HOSPITAL_COMMUNITY): Payer: Medicare Other

## 2023-04-12 ENCOUNTER — Other Ambulatory Visit: Payer: Self-pay

## 2023-04-12 DIAGNOSIS — M1711 Unilateral primary osteoarthritis, right knee: Secondary | ICD-10-CM

## 2023-04-12 DIAGNOSIS — Z96652 Presence of left artificial knee joint: Secondary | ICD-10-CM | POA: Diagnosis not present

## 2023-04-12 DIAGNOSIS — Z471 Aftercare following joint replacement surgery: Secondary | ICD-10-CM | POA: Diagnosis not present

## 2023-04-12 DIAGNOSIS — Z96651 Presence of right artificial knee joint: Secondary | ICD-10-CM | POA: Diagnosis not present

## 2023-04-12 DIAGNOSIS — R609 Edema, unspecified: Secondary | ICD-10-CM | POA: Diagnosis not present

## 2023-04-12 DIAGNOSIS — Z87891 Personal history of nicotine dependence: Secondary | ICD-10-CM | POA: Insufficient documentation

## 2023-04-12 HISTORY — PX: TOTAL KNEE ARTHROPLASTY: SHX125

## 2023-04-12 LAB — CBC
HCT: 39.3 % (ref 39.0–52.0)
Hemoglobin: 12.9 g/dL — ABNORMAL LOW (ref 13.0–17.0)
MCH: 28.5 pg (ref 26.0–34.0)
MCHC: 32.8 g/dL (ref 30.0–36.0)
MCV: 86.8 fL (ref 80.0–100.0)
Platelets: 211 10*3/uL (ref 150–400)
RBC: 4.53 MIL/uL (ref 4.22–5.81)
RDW: 13.2 % (ref 11.5–15.5)
WBC: 10.9 10*3/uL — ABNORMAL HIGH (ref 4.0–10.5)
nRBC: 0 % (ref 0.0–0.2)

## 2023-04-12 SURGERY — ARTHROPLASTY, KNEE, TOTAL
Anesthesia: General | Site: Knee | Laterality: Right

## 2023-04-12 MED ORDER — ORAL CARE MOUTH RINSE: 15.0000 mL | Freq: Once | OROMUCOSAL | Status: AC

## 2023-04-12 MED ORDER — MIDAZOLAM HCL 2 MG/2ML IJ SOLN
INTRAMUSCULAR | Status: AC
  Filled 2023-04-12: qty 2

## 2023-04-12 MED ORDER — TRAMADOL HCL 50 MG PO TABS
50.0000 mg | ORAL_TABLET | Freq: Four times a day (QID) | ORAL | Status: DC
  Administered 2023-04-12 – 2023-04-13 (×3): 50 mg via ORAL
  Filled 2023-04-12 (×3): qty 1

## 2023-04-12 MED ORDER — CEFAZOLIN SODIUM-DEXTROSE 2-4 GM/100ML-% IV SOLN
2.0000 g | Freq: Three times a day (TID) | INTRAVENOUS | Status: AC
  Administered 2023-04-12 – 2023-04-13 (×2): 2 g via INTRAVENOUS
  Filled 2023-04-12 (×2): qty 100

## 2023-04-12 MED ORDER — FENTANYL CITRATE PF 50 MCG/ML IJ SOSY
25.0000 ug | PREFILLED_SYRINGE | INTRAMUSCULAR | Status: DC | PRN
  Administered 2023-04-12 (×2): 50 ug via INTRAVENOUS
  Filled 2023-04-12 (×2): qty 1

## 2023-04-12 MED ORDER — BUPIVACAINE-MELOXICAM ER 200-6 MG/7ML IJ SOLN
INTRAMUSCULAR | Status: DC | PRN
  Administered 2023-04-12: 400 mg

## 2023-04-12 MED ORDER — EPHEDRINE SULFATE-NACL 50-0.9 MG/10ML-% IV SOSY
PREFILLED_SYRINGE | INTRAVENOUS | Status: DC | PRN
Start: 2023-04-12 — End: 2023-04-12
  Administered 2023-04-12 (×2): 10 mg via INTRAVENOUS
  Administered 2023-04-12: 5 mg via INTRAVENOUS

## 2023-04-12 MED ORDER — ASPIRIN 81 MG PO CHEW
81.0000 mg | CHEWABLE_TABLET | Freq: Two times a day (BID) | ORAL | Status: DC
  Administered 2023-04-12 – 2023-04-13 (×2): 81 mg via ORAL
  Filled 2023-04-12 (×2): qty 1

## 2023-04-12 MED ORDER — ONDANSETRON HCL 4 MG/2ML IJ SOLN: 4.0000 mg | Freq: Four times a day (QID) | INTRAMUSCULAR | Status: DC | PRN

## 2023-04-12 MED ORDER — EPHEDRINE 5 MG/ML INJ
INTRAVENOUS | Status: AC
  Filled 2023-04-12: qty 5

## 2023-04-12 MED ORDER — PHENYLEPHRINE 80 MCG/ML (10ML) SYRINGE FOR IV PUSH (FOR BLOOD PRESSURE SUPPORT)
PREFILLED_SYRINGE | INTRAVENOUS | Status: AC
  Filled 2023-04-12: qty 10

## 2023-04-12 MED ORDER — METOCLOPRAMIDE HCL 10 MG PO TABS: 5.0000 mg | ORAL_TABLET | Freq: Three times a day (TID) | ORAL | Status: DC | PRN

## 2023-04-12 MED ORDER — MORPHINE SULFATE (PF) 2 MG/ML IV SOLN: 0.5000 mg | INTRAVENOUS | Status: DC | PRN

## 2023-04-12 MED ORDER — HYDROCODONE-ACETAMINOPHEN 5-325 MG PO TABS: 1.0000 | ORAL_TABLET | ORAL | Status: DC | PRN

## 2023-04-12 MED ORDER — 0.9 % SODIUM CHLORIDE (POUR BTL) OPTIME
TOPICAL | Status: DC | PRN
  Administered 2023-04-12: 1000 mL

## 2023-04-12 MED ORDER — ONDANSETRON HCL 4 MG/2ML IJ SOLN
4.0000 mg | Freq: Once | INTRAMUSCULAR | Status: AC
  Administered 2023-04-12: 4 mg via INTRAVENOUS
  Filled 2023-04-12: qty 2

## 2023-04-12 MED ORDER — HYDROCODONE-ACETAMINOPHEN 7.5-325 MG PO TABS: 1.0000 | ORAL_TABLET | ORAL | Status: DC | PRN

## 2023-04-12 MED ORDER — DEXAMETHASONE SODIUM PHOSPHATE 10 MG/ML IJ SOLN
INTRAMUSCULAR | Status: DC | PRN
  Administered 2023-04-12: 10 mg via INTRAVENOUS

## 2023-04-12 MED ORDER — HYDROMORPHONE HCL 1 MG/ML IJ SOLN
INTRAMUSCULAR | Status: AC
  Filled 2023-04-12: qty 1

## 2023-04-12 MED ORDER — FENTANYL CITRATE (PF) 100 MCG/2ML IJ SOLN
INTRAMUSCULAR | Status: DC | PRN
  Administered 2023-04-12: 100 ug via INTRAVENOUS
  Administered 2023-04-12 (×2): 50 ug via INTRAVENOUS

## 2023-04-12 MED ORDER — MENTHOL 3 MG MT LOZG: 1.0000 | LOZENGE | OROMUCOSAL | Status: DC | PRN

## 2023-04-12 MED ORDER — ATORVASTATIN CALCIUM 40 MG PO TABS
40.0000 mg | ORAL_TABLET | Freq: Every day | ORAL | Status: DC
  Administered 2023-04-12: 40 mg via ORAL
  Filled 2023-04-12: qty 1

## 2023-04-12 MED ORDER — ACETAMINOPHEN 500 MG PO TABS
500.0000 mg | ORAL_TABLET | Freq: Four times a day (QID) | ORAL | Status: DC
  Administered 2023-04-12 – 2023-04-13 (×3): 500 mg via ORAL
  Filled 2023-04-12 (×3): qty 1

## 2023-04-12 MED ORDER — ESCITALOPRAM OXALATE 10 MG PO TABS
20.0000 mg | ORAL_TABLET | Freq: Every evening | ORAL | Status: DC
  Administered 2023-04-12: 20 mg via ORAL
  Filled 2023-04-12: qty 2

## 2023-04-12 MED ORDER — DOCUSATE SODIUM 100 MG PO CAPS
100.0000 mg | ORAL_CAPSULE | Freq: Two times a day (BID) | ORAL | Status: DC
  Administered 2023-04-12 – 2023-04-13 (×2): 100 mg via ORAL
  Filled 2023-04-12 (×3): qty 1

## 2023-04-12 MED ORDER — DEXAMETHASONE SODIUM PHOSPHATE 10 MG/ML IJ SOLN
10.0000 mg | Freq: Once | INTRAMUSCULAR | Status: AC
  Administered 2023-04-13: 10 mg via INTRAVENOUS
  Filled 2023-04-12: qty 1

## 2023-04-12 MED ORDER — METOCLOPRAMIDE HCL 5 MG/ML IJ SOLN: 5.0000 mg | Freq: Three times a day (TID) | INTRAMUSCULAR | Status: DC | PRN

## 2023-04-12 MED ORDER — POLYETHYLENE GLYCOL 3350 17 G PO PACK
17.0000 g | PACK | Freq: Every day | ORAL | Status: DC
  Administered 2023-04-12: 17 g via ORAL
  Filled 2023-04-12 (×3): qty 1

## 2023-04-12 MED ORDER — FENTANYL CITRATE (PF) 250 MCG/5ML IJ SOLN
INTRAMUSCULAR | Status: AC
  Filled 2023-04-12: qty 5

## 2023-04-12 MED ORDER — METHOCARBAMOL 500 MG PO TABS: 500.0000 mg | ORAL_TABLET | Freq: Four times a day (QID) | ORAL | Status: DC | PRN

## 2023-04-12 MED ORDER — POVIDONE-IODINE 10 % EX SWAB
2.0000 | Freq: Once | CUTANEOUS | Status: AC
  Administered 2023-04-12: 2 via TOPICAL

## 2023-04-12 MED ORDER — DEXAMETHASONE SODIUM PHOSPHATE 10 MG/ML IJ SOLN
INTRAMUSCULAR | Status: AC
  Filled 2023-04-12: qty 1

## 2023-04-12 MED ORDER — ROCURONIUM BROMIDE 10 MG/ML (PF) SYRINGE
PREFILLED_SYRINGE | INTRAVENOUS | Status: AC
  Filled 2023-04-12: qty 10

## 2023-04-12 MED ORDER — ACETAMINOPHEN 325 MG PO TABS
325.0000 mg | ORAL_TABLET | Freq: Four times a day (QID) | ORAL | Status: DC | PRN
  Administered 2023-04-13: 650 mg via ORAL
  Filled 2023-04-12: qty 2

## 2023-04-12 MED ORDER — PROPOFOL 10 MG/ML IV BOLUS
INTRAVENOUS | Status: AC
  Filled 2023-04-12: qty 20

## 2023-04-12 MED ORDER — OXYCODONE HCL 5 MG PO TABS
5.0000 mg | ORAL_TABLET | Freq: Once | ORAL | Status: AC
  Administered 2023-04-12: 5 mg via ORAL
  Filled 2023-04-12: qty 1

## 2023-04-12 MED ORDER — PHENYLEPHRINE 80 MCG/ML (10ML) SYRINGE FOR IV PUSH (FOR BLOOD PRESSURE SUPPORT)
PREFILLED_SYRINGE | INTRAVENOUS | Status: DC | PRN
Start: 2023-04-12 — End: 2023-04-12
  Administered 2023-04-12: 80 ug via INTRAVENOUS
  Administered 2023-04-12: 480 ug via INTRAVENOUS
  Administered 2023-04-12: 80 ug via INTRAVENOUS
  Administered 2023-04-12 (×2): 160 ug via INTRAVENOUS

## 2023-04-12 MED ORDER — ONDANSETRON HCL 4 MG/2ML IJ SOLN
INTRAMUSCULAR | Status: DC | PRN
  Administered 2023-04-12: 4 mg via INTRAVENOUS

## 2023-04-12 MED ORDER — SUGAMMADEX SODIUM 200 MG/2ML IV SOLN
INTRAVENOUS | Status: DC | PRN
  Administered 2023-04-12: 200 mg via INTRAVENOUS

## 2023-04-12 MED ORDER — LIDOCAINE HCL (CARDIAC) PF 100 MG/5ML IV SOSY
PREFILLED_SYRINGE | INTRAVENOUS | Status: DC | PRN
  Administered 2023-04-12: 80 mg via INTRATRACHEAL

## 2023-04-12 MED ORDER — PROPOFOL 10 MG/ML IV BOLUS
INTRAVENOUS | Status: DC | PRN
  Administered 2023-04-12: 180 mg via INTRAVENOUS

## 2023-04-12 MED ORDER — PHENOL 1.4 % MT LIQD: 1.0000 | OROMUCOSAL | Status: DC | PRN

## 2023-04-12 MED ORDER — ROCURONIUM BROMIDE 10 MG/ML (PF) SYRINGE
PREFILLED_SYRINGE | INTRAVENOUS | Status: DC | PRN
  Administered 2023-04-12 (×2): 50 mg via INTRAVENOUS

## 2023-04-12 MED ORDER — ONDANSETRON HCL 4 MG PO TABS: 4.0000 mg | ORAL_TABLET | Freq: Four times a day (QID) | ORAL | Status: DC | PRN

## 2023-04-12 MED ORDER — HYDROMORPHONE HCL 1 MG/ML IJ SOLN
INTRAMUSCULAR | Status: DC | PRN
Start: 2023-04-12 — End: 2023-04-12
  Administered 2023-04-12 (×2): .5 mg via INTRAVENOUS

## 2023-04-12 MED ORDER — OXYCODONE HCL 5 MG/5ML PO SOLN: 5.0000 mg | Freq: Once | ORAL | Status: DC | PRN

## 2023-04-12 MED ORDER — SODIUM CHLORIDE 0.9 % IR SOLN
Status: DC | PRN
  Administered 2023-04-12: 3000 mL

## 2023-04-12 MED ORDER — CHLORHEXIDINE GLUCONATE 0.12 % MT SOLN
15.0000 mL | Freq: Once | OROMUCOSAL | Status: AC
  Administered 2023-04-12: 15 mL via OROMUCOSAL

## 2023-04-12 MED ORDER — MIDAZOLAM HCL 2 MG/2ML IJ SOLN
INTRAMUSCULAR | Status: DC | PRN
  Administered 2023-04-12: 2 mg via INTRAVENOUS

## 2023-04-12 MED ORDER — LACTATED RINGERS IV SOLN: INTRAVENOUS | Status: DC

## 2023-04-12 MED ORDER — METHOCARBAMOL 1000 MG/10ML IJ SOLN
500.0000 mg | Freq: Once | INTRAVENOUS | Status: AC
  Administered 2023-04-12: 500 mg via INTRAVENOUS
  Filled 2023-04-12: qty 5

## 2023-04-12 MED ORDER — PREGABALIN 50 MG PO CAPS
50.0000 mg | ORAL_CAPSULE | Freq: Once | ORAL | Status: AC
  Administered 2023-04-12: 50 mg via ORAL
  Filled 2023-04-12: qty 1

## 2023-04-12 MED ORDER — METHOCARBAMOL 1000 MG/10ML IJ SOLN: 500.0000 mg | Freq: Four times a day (QID) | INTRAVENOUS | Status: DC | PRN

## 2023-04-12 MED ORDER — LIDOCAINE HCL (PF) 2 % IJ SOLN
INTRAMUSCULAR | Status: AC
  Filled 2023-04-12: qty 5

## 2023-04-12 MED ORDER — TRANEXAMIC ACID-NACL 1000-0.7 MG/100ML-% IV SOLN
1000.0000 mg | INTRAVENOUS | Status: AC
  Administered 2023-04-12: 1000 mg via INTRAVENOUS
  Filled 2023-04-12: qty 100

## 2023-04-12 MED ORDER — TRANEXAMIC ACID-NACL 1000-0.7 MG/100ML-% IV SOLN: 1000.0000 mg | Freq: Once | INTRAVENOUS | Status: DC

## 2023-04-12 MED ORDER — OXYCODONE HCL 5 MG PO TABS: 5.0000 mg | ORAL_TABLET | Freq: Once | ORAL | Status: DC | PRN

## 2023-04-12 MED ORDER — ONDANSETRON HCL 4 MG/2ML IJ SOLN: 4.0000 mg | Freq: Once | INTRAMUSCULAR | Status: DC | PRN

## 2023-04-12 MED ORDER — ROPIVACAINE HCL 5 MG/ML IJ SOLN
INTRAMUSCULAR | Status: AC
  Filled 2023-04-12: qty 30

## 2023-04-12 MED ORDER — BUPIVACAINE-MELOXICAM ER 200-6 MG/7ML IJ SOLN
INTRAMUSCULAR | Status: AC
  Filled 2023-04-12: qty 2

## 2023-04-12 MED ORDER — ONDANSETRON HCL 4 MG/2ML IJ SOLN
INTRAMUSCULAR | Status: AC
  Filled 2023-04-12: qty 2

## 2023-04-12 MED ORDER — CEFAZOLIN SODIUM-DEXTROSE 2-4 GM/100ML-% IV SOLN
2.0000 g | INTRAVENOUS | Status: AC
  Administered 2023-04-12: 2 g via INTRAVENOUS
  Filled 2023-04-12: qty 100

## 2023-04-12 MED ORDER — SODIUM CHLORIDE 0.9 % IV SOLN: INTRAVENOUS | Status: DC

## 2023-04-12 SURGICAL SUPPLY — 58 items
ATTUNE PS FEM RT SZ 6 CEM KNEE (Femur) IMPLANT
BANDAGE ESMARK 6X9 LF (GAUZE/BANDAGES/DRESSINGS) ×1 IMPLANT
BASEPLATE TIB CMT FB PCKT SZ6 (Knees) IMPLANT
BLADE SAGITTAL 25.0X1.27X90 (BLADE) ×1 IMPLANT
BLADE SAW SGTL 11.0X1.19X90.0M (BLADE) ×1 IMPLANT
BLADE SURG SZ10 CARB STEEL (BLADE) ×1 IMPLANT
BNDG CMPR 9X6 STRL LF SNTH (GAUZE/BANDAGES/DRESSINGS) ×1
BNDG ESMARK 6X9 LF (GAUZE/BANDAGES/DRESSINGS) ×1
BSPLAT TIB 6 CMNT FXBRNG STRL (Knees) ×1 IMPLANT
CEMENT HV SMART SET (Cement) ×2 IMPLANT
CLOTH BEACON ORANGE TIMEOUT ST (SAFETY) ×1 IMPLANT
COOLER ICEMAN CLASSIC (MISCELLANEOUS) ×1 IMPLANT
COVER LIGHT HANDLE STERIS (MISCELLANEOUS) ×2 IMPLANT
CUFF TOURN SGL QUICK 34 (TOURNIQUET CUFF) ×1
CUFF TRNQT CYL 34X4.125X (TOURNIQUET CUFF) ×1 IMPLANT
DRAPE BACK TABLE (DRAPES) ×1 IMPLANT
DRAPE EXTREMITY T 121X128X90 (DISPOSABLE) ×1 IMPLANT
DRESSING AQUACEL AG ADV 3.5X12 (MISCELLANEOUS) ×1 IMPLANT
DRSG AQUACEL AG ADV 3.5X12 (MISCELLANEOUS) ×1
DURAPREP 26ML APPLICATOR (WOUND CARE) ×2 IMPLANT
ELECT REM PT RETURN 9FT ADLT (ELECTROSURGICAL) ×1
ELECTRODE REM PT RTRN 9FT ADLT (ELECTROSURGICAL) ×1 IMPLANT
GLOVE BIOGEL PI IND STRL 7.0 (GLOVE) ×6 IMPLANT
GLOVE BIOGEL PI IND STRL 8.5 (GLOVE) ×1 IMPLANT
GLOVE SKINSENSE STRL SZ8.0 LF (GLOVE) ×1 IMPLANT
GOWN STRL REUS W/TWL LRG LVL3 (GOWN DISPOSABLE) ×3 IMPLANT
GOWN STRL REUS W/TWL XL LVL3 (GOWN DISPOSABLE) ×1 IMPLANT
HANDPIECE INTERPULSE COAX TIP (DISPOSABLE) ×1
HOOD W/PEELAWAY (MISCELLANEOUS) ×4 IMPLANT
INSERT TIB PS FB ATTUNE SZ6X5 (Knees) IMPLANT
INST SET MAJOR BONE (KITS) ×1 IMPLANT
IV NS IRRIG 3000ML ARTHROMATIC (IV SOLUTION) ×1 IMPLANT
KIT BLADEGUARD II DBL (SET/KITS/TRAYS/PACK) ×1 IMPLANT
KIT TURNOVER KIT A (KITS) ×1 IMPLANT
MANIFOLD NEPTUNE II (INSTRUMENTS) ×1 IMPLANT
MARKER SKIN DUAL TIP RULER LAB (MISCELLANEOUS) ×1 IMPLANT
NS IRRIG 1000ML POUR BTL (IV SOLUTION) ×1 IMPLANT
PACK TOTAL JOINT (CUSTOM PROCEDURE TRAY) ×1 IMPLANT
PAD ARMBOARD 7.5X6 YLW CONV (MISCELLANEOUS) ×1 IMPLANT
PAD COLD SHLDR SM WRAP-ON (PAD) ×1 IMPLANT
PATELLA MEDIAL ATTUN 35MM KNEE (Knees) IMPLANT
PILLOW KNEE EXTENSION 0 DEG (MISCELLANEOUS) ×1 IMPLANT
PIN/DRILL PACK ORTHO 1/8X3.0 (PIN) ×1 IMPLANT
POSITIONER HEAD 8X9X4 ADT (SOFTGOODS) ×1 IMPLANT
SAW OSC TIP CART 19.5X105X1.3 (SAW) ×1 IMPLANT
SET BASIN LINEN APH (SET/KITS/TRAYS/PACK) ×1 IMPLANT
SET HNDPC FAN SPRY TIP SCT (DISPOSABLE) ×1 IMPLANT
SOLUTION IRRIG SURGIPHOR (IV SOLUTION) ×1 IMPLANT
STAPLER VISISTAT 35W (STAPLE) ×1 IMPLANT
SUT BRALON NAB BRD #1 30IN (SUTURE) ×1 IMPLANT
SUT MNCRL 0 VIOLET CTX 36 (SUTURE) ×1 IMPLANT
SUT MON AB 0 CT1 (SUTURE) ×1 IMPLANT
SYR BULB IRRIG 60ML STRL (SYRINGE) ×1 IMPLANT
TOWEL OR 17X26 4PK STRL BLUE (TOWEL DISPOSABLE) ×1 IMPLANT
TOWER CARTRIDGE SMART MIX (DISPOSABLE) ×1 IMPLANT
TRAY FOLEY MTR SLVR 16FR STAT (SET/KITS/TRAYS/PACK) ×1 IMPLANT
WATER STERILE IRR 1000ML POUR (IV SOLUTION) ×2 IMPLANT
YANKAUER SUCT 12FT TUBE ARGYLE (SUCTIONS) ×1 IMPLANT

## 2023-04-12 NOTE — Progress Notes (Signed)
Assisted Dr. Johnnette Litter with right, adductor canal, ultrasound guided block. Side rails up, monitors on throughout procedure. See vital signs in flow sheet. Tolerated Procedure well.

## 2023-04-12 NOTE — Interval H&P Note (Signed)
History and Physical Interval Note:  04/12/2023 7:29 AM  [name]  has presented today for surgery, with the diagnosis of Osteoarthritis right knee.  The various methods of treatment have been discussed with the patient and family. After consideration of risks, benefits and other options for treatment, the patient has consented to  Procedure(s): TOTAL KNEE ARTHROPLASTY (Right) as a surgical intervention.  The patient's history has been reviewed, patient examined, no change in status, stable for surgery.  I have reviewed the patient's chart and labs.  Questions were answered to the patient's satisfaction.     Fuller Canada

## 2023-04-12 NOTE — Transfer of Care (Addendum)
Immediate Anesthesia Transfer of Care Note  Patient: [name]  Procedure(s) Performed: TOTAL KNEE ARTHROPLASTY (Right: Knee)  Patient Location: PACU  Anesthesia Type:General  Level of Consciousness: awake and patient cooperative  Airway & Oxygen Therapy: Patient Spontanous Breathing and Patient connected to face mask oxygen  Post-op Assessment: Report given to RN and Post -op Vital signs reviewed and stable  Post vital signs: Reviewed and stable  Last Vitals:  Vitals Value Taken Time  BP 131/73 04/12/23 0950  Temp 36.6 04/12/23  0949  Pulse 98 04/12/23 0954  Resp 18 04/12/23   0949  SpO2 99 % 04/12/23 0954  Vitals shown include unfiled device data.  Last Pain:  Vitals:   04/12/23 0642  TempSrc: Oral  PainSc: 2       Patients Stated Pain Goal: 6 (04/12/23 9604)  Complications: No notable events documented.

## 2023-04-12 NOTE — TOC Transition Note (Signed)
Transition of Care [facility]) - CM/SW Discharge Note   Patient Details  Name: [name] MRN: [id number] Date of Birth: Sep 16, 1955  [facility] CM/SW Contact:  Villa Herb, LCSWA Phone Number: 04/12/2023, 4:16 PM   Clinical Narrative:    [facility] consulted for [facility] needs. CSW noted PT is recommending HH PT for pt at D/C. CSW spoke to [place] with [facility] who states Dr. Mort Sawyers office has set up [facility] with their agency. [facility] has the needed [facility] in place already. TOC signing off.   Final next level of care: Home w Home Health Services Barriers to Discharge: Continued Medical Work up   Patient Goals and CMS Choice CMS Medicare.gov Compare Post Acute Care list provided to:: Patient Choice offered to / list presented to : Patient  Discharge Placement                         Discharge Plan and Services Additional resources added to the After Visit Summary for                            [facility] Arranged: PT [facility] Agency: [facility] Date [facility] Agency Contacted: 04/12/23   Representative spoke with at [facility] Agency: [facility]  Social Determinants of Health (SDOH) Interventions SDOH Screenings   Tobacco Use: Medium Risk (04/12/2023)     Readmission Risk Interventions     No data to display

## 2023-04-12 NOTE — Plan of Care (Signed)
  Problem: Acute Rehab PT Goals(only PT should resolve) Goal: Pt Will Go Sit To Supine/Side Outcome: Progressing Flowsheets (Taken 04/12/2023 1509) Pt will go Sit to Supine/Side:  with modified independence  Independently Goal: Patient Will Transfer Sit To/From Stand Outcome: Progressing Flowsheets (Taken 04/12/2023 1509) Patient will transfer sit to/from stand: with modified independence Goal: Pt Will Transfer Bed To Chair/Chair To Bed Outcome: Progressing Flowsheets (Taken 04/12/2023 1509) Pt will Transfer Bed to Chair/Chair to Bed: with modified independence Goal: Pt Will Ambulate Outcome: Progressing Flowsheets (Taken 04/12/2023 1509) Pt will Ambulate:  > 125 feet  with modified independence  with rolling walker   3:10 PM, 04/12/23 Ocie Bob, MPT Physical Therapist with [facility] [phone] office [phone] mobile phone

## 2023-04-12 NOTE — Anesthesia Procedure Notes (Signed)
Procedure Name: Intubation Date/Time: 04/12/2023 7:38 AM  Performed by: Oletha Cruel, CRNAPre-anesthesia Checklist: Patient identified, Emergency Drugs available, Suction available and Patient being monitored Patient Re-evaluated:Patient Re-evaluated prior to induction Oxygen Delivery Method: Circle system utilized Preoxygenation: Pre-oxygenation with 100% oxygen Induction Type: IV induction Ventilation: Mask ventilation without difficulty Laryngoscope Size: Mac and 4 Grade View: Grade II Tube type: Oral Number of attempts: 1 Airway Equipment and Method: Stylet Placement Confirmation: ETT inserted through vocal cords under direct vision, positive ETCO2, CO2 detector and breath sounds checked- equal and bilateral Secured at: 23 cm Tube secured with: Tape Dental Injury: Teeth and Oropharynx as per pre-operative assessment  Comments: Atraumatic intubation. Patient edentulous. Lips in preoperative condition.

## 2023-04-12 NOTE — Op Note (Signed)
Dictation for total knee replacement  04/12/2023  9:40 AM  Orthopaedic Surgery Operative Note (CSN: [id number])  [name]  04/12/1956 Date of Surgery: 04/12/2023   04/12/2023  9:38 AM  PATIENT:  [name]  67 y.o. male  PRE-OPERATIVE DIAGNOSIS:  Osteoarthritis right knee  POST-OPERATIVE DIAGNOSIS:  Osteoarthritis right knee  PROCEDURE:  Procedure(s): TOTAL KNEE ARTHROPLASTY (Right)  FINDINGS:  Medial compartment femur was completely exposed medial femoral condyle posterior horn medial meniscus was intact chondral changes on the tibial side with grade 2  Lateral side was completely intact with intact lateral meniscus  PCL and ACL are intact  Patella grade II chondromalacia  Preop flexion contracture with less than 10 degrees mild varus deformity  SURGEON:  Surgeons and Role:    Vickki Hearing, MD - Primary  PHYSICIAN ASSISTANT:   ASSISTANTS: cynthai and harley    ANESTHESIA:   general  EBL:  150 mL   BLOOD ADMINISTERED:none  DRAINS: none   LOCAL MEDICATIONS USED:  OTHER zinrelef  SPECIMEN:  No Specimen  DISPOSITION OF SPECIMEN:  N/A  COUNTS:  YES  TOURNIQUET:   Total Tourniquet Time Documented: Thigh (Right) - 87 minutes Total: Thigh (Right) - 87 minutes   DICTATION: .Reubin Milan Dictation  PLAN OF CARE: Admit for overnight observation  PATIENT DISPOSITION:  PACU - hemodynamically stable.   Delay start of Pharmacological VTE agent (>24hrs) due to surgical blood loss or risk of bleeding: not applicable  Bone cuts: Femur 10 Lateral tibia 9 Patella 9   Implants: Implant Name Type Inv. Item Serial No. Manufacturer Lot No. LRB No. Used Action  CEMENT HV SMART SET - [id number] Cement CEMENT HV SMART SET  [employer]PU[employer] [id number] Right 1 Implanted  BSPLAT TIB 6 CMNT FXBRNG STRL - [id number] Knees BSPLAT TIB 6 CMNT FXBRNG STRL  DEPU[employer]TH[employer] [id number] Right 1 Implanted  PATELLA MEDIAL ATTUN KNEE - [id number] Knees  PATELLA MEDIAL ATTUN KNEE  DEPUY ORTHOPAEDICS [id number] Right 1 Implanted  ATTUNE PS FEM RT SZ 6 CEM KNEE - [id number] Femur ATTUNE PS FEM RT SZ 6 CEM KNEE  DEPU[employer] ORTH[employer] [id number] Right 1 Implanted  INSERT TIB PS FB ATTUNE SZ6X5 - [id number] Knees INSERT TIB PS FB ATTUNE [id number]  DEPU[employer] ORTH[employer] [id number] Right 1 Implanted     Indications for Surgery:   [name] is a 67 y.o. male who succumbed to disabling knee pain and loss of function right knee secondary to osteoarthritis.  Benefits and risks of operative and nonoperative management were discussed prior to surgery with patient/guardian(s) and informed consent form was completed.  While all risks cannot be anticipated, specific risks including infection, need for additional surgery, stiffness, postop pain, infection, implant removal, loosening, infection requiring amputation, deep vein thrombosis, pulmonary embolus were discussed.   Procedure:    Details of surgery: The patient was identified by 2 approved identification mechanisms. The operative extremity was evaluated and found to be acceptable for surgical treatment today. The chart was reviewed. The surgical site was confirmed and marked. The patient had a saphenous nerve block postoperatively  The patient was taken to the operating room and given appropriate antibiotic Ancef 2 g. This is consistent with the SCIP protocol.  The patient was given the following anesthetic: General  The patient was then placed supine on the operating table. A Foley catheter was inserted. The operative extremity was prepped and draped sterilely from the toes to the groin.  Timeout was executed confirming the patient's name,  surgical site, antibiotic administration, x-rays available, and implants available.  The operative limb,  was exsanguinated with a six-inch Esmarch and the tourniquet was inflated to 300 mmHg.  A straight midline incision was made over the right KNEE with the knee in  flexion and taken down to the extensor mechanism. A medial arthrotomy was performed. The patella was everted and the patellofemoral soft tissue was released, along with the patellar fat pad.  The anterior cruciate ligament and PCL were resected.  The anterior horns of the lateral and medial meniscus were resected. The medial soft tissue sleeve was elevated to the mid coronal plane.  A three-eighths inch drill bit was used to enter the femoral canal which was decompressed with suction and irrigation until clear.   The distal femoral cutting guide was set for 10 mm distal resection,  5valgus alignment, for a right knee. The distal femur was resected and checked for flatness.  The attune sizing femoral guide was placed and the femur was preliminarily sized to a size larger than 5 but less than 6.   The external alignment guide for the tibial resection was then applied to the distal and proximal tibia and set for anatomic slope along with 9 MM resection  from the lateral.   Rotational alignment was set using the malleolus, the tibial tubercle and the tibial spines.  The proximal tibia was resected along with  residual menisci. The tibia was sized using a base plate to a size 6.   The extension gap was checked.  A 5 and 6 block were checked and the 5 was the best fit   A 4-in-1 cutting block was placed along with collateral ligament retractors.  Our target was proper rotation based on the epicondylar axis using Whitesides line as a guide as well.  Once the block was pinned in place we took the spacer block that balanced extension gap and placed it on top of the tibia under the femoral block.  This fit well in the standard cup position I did also try it with the block brought down but found that the angel wing indicated notching.  I did not move the block down 1.5 mm   Once I was satisfied with the spacer block collateral ligament retractors were placed and I completed the 4 distal femoral cuts    The extension gap was rechecked with the same spacer block which was a size 5   The correct sized notch cutting guide for the femur was then applied and the notch cut was made.  Trial reduction was completed using size 6 femur 6 tibia 5 insert trial implants. Patella tracking was normal  We then skeletonized the patella. It measured 25 in thickness and the patellar resection was set for -9.5 millimeters. the patellar resection was completed. The patella diameter measured 36 but we used a 35. We then drilled the peg holes for the patella.   The proximal tibia was prepared using the size 6 base plate.  Thorough irrigation was performed using saline  and the bone was dried and prepared for cement. The cement was mixed on the back table using third generation preparation techniques  The implants were then cemented in place and excess cement was removed. The cement was allowed to cure. Surgipor irrigation was placed followed by saline irrigation  Any excess bone fragments and cement was removed.  The extensor mechanism was closed with #1 Bralon suture in interrupted fashion with the knee in flexion followed by subcutaneous tissue  closure using 0 Monocryl suture in 2 layers  Zynrelef a total of 2 vials were injected prior to complete extensor mechanism closure.  The openings and the capsule were then closed with #1 Braylon   Skin approximation was performed using staples  A sterile dressing was applied, TED hose were placed on the operative extremity followed by Cryo/Cuff.  The patient was taken recovery room in stable condition  Postop plan: Weightbearing as tolerated CPM machine Immediate physical therapy Discharge tomorrow if stable

## 2023-04-12 NOTE — Evaluation (Signed)
Physical Therapy Evaluation Patient Details Name: [name] MRN: [id number] DOB: 03/31/1956 Today's Date: 04/12/2023   RIGHT KNEE ROM: 0 - 90 degrees AMBULATION DISTANCE: 100 feet using RW with Supervision   History of Present Illness  [name] is a 67 y/o male, s/p Right TKA on 04/12/23, with the diagnosis of Osteoarthritis right knee.  Clinical Impression  Patient instructed in and give written instructions for HEP with good carryover for completing, fair/good return for right heel to toe stepping without loss of balance and limited for gait training mostly due to fatigue and mild increase in right knee pain.  Patient tolerated sitting up in chair with RLE dangling after therapy with visitors in room.  Patient will benefit from continued skilled physical therapy in hospital and recommended venue below to increase strength, balance, endurance for safe ADLs and gait.          If plan is discharge home, recommend the following: A little help with walking and/or transfers;A little help with bathing/dressing/bathroom;Help with stairs or ramp for entrance;Assistance with cooking/housework   Can travel by private vehicle        Equipment Recommendations None recommended by PT  Recommendations for Other Services       Functional Status Assessment Patient has had a recent decline in their functional status and demonstrates the ability to make significant improvements in function in a reasonable and predictable amount of time.     Precautions / Restrictions Precautions Precautions: Fall Restrictions Weight Bearing Restrictions: Yes RLE Weight Bearing: Weight bearing as tolerated      Mobility  Bed Mobility Overal bed mobility: Needs Assistance Bed Mobility: Supine to Sit     Supine to sit: Supervision, Modified independent (Device/Increase time)     General bed mobility comments: good return for using LLE to hook and move RLE during supine to sitting     Transfers Overall transfer level: Needs assistance Equipment used: Rolling walker (2 wheels) Transfers: Sit to/from Stand, Bed to chair/wheelchair/BSC Sit to Stand: Supervision   Step pivot transfers: Supervision       General transfer comment: labored movement, required verbal cues for proper hand placement during sit to stands with fair/good carryover    Ambulation/Gait Ambulation/Gait assistance: Supervision Gait Distance (Feet): 100 Feet Assistive device: Rolling walker (2 wheels) Gait Pattern/deviations: Decreased step length - right, Decreased step length - left, Decreased stance time - right, Decreased stride length, Antalgic Gait velocity: decreased     General Gait Details: fair/good return for right heel to toe stepping without loss of balance, limited mostly due to fatigue and mild increase in right knee pain  Stairs            Wheelchair Mobility     Tilt Bed    Modified Rankin (Stroke Patients Only)       Balance Overall balance assessment: Needs assistance Sitting-balance support: Feet supported, No upper extremity supported Sitting balance-Leahy Scale: Good Sitting balance - Comments: seated at EOB   Standing balance support: During functional activity, Bilateral upper extremity supported, Reliant on assistive device for balance Standing balance-Leahy Scale: Fair Standing balance comment: fair/good using RW                             Pertinent Vitals/Pain Pain Assessment Pain Assessment: 0-10 Pain Score: 7  Pain Location: right knee Pain Descriptors / Indicators: Sore, Discomfort Pain Intervention(s): Limited activity within patient's tolerance, Monitored during session, Premedicated before  session, Repositioned    Home Living Family/patient expects to be discharged to:: Private residence Living Arrangements: Alone Available Help at Discharge: Family;Available 24 hours/day Type of Home: Mobile home Home Access: Stairs to  enter Entrance Stairs-Rails: Right;Left;Can reach both Entrance Stairs-Number of Steps: 3   Home Layout: One level Home Equipment: Agricultural consultant (2 wheels);BSC/3in1;Shower seat      Prior Function Prior Level of Function : Independent/Modified Independent;Driving             Mobility Comments: Community ambulator without AD, drives, shops ADLs Comments: Independent     Extremity/Trunk Assessment   Upper Extremity Assessment Upper Extremity Assessment: Overall WFL for tasks assessed    Lower Extremity Assessment Lower Extremity Assessment: Overall WFL for tasks assessed;RLE deficits/detail RLE Deficits / Details: grossly 4/5 RLE: Unable to fully assess due to pain RLE Sensation: WNL RLE Coordination: WNL    Cervical / Trunk Assessment Cervical / Trunk Assessment: Normal  Communication   Communication Communication: No apparent difficulties  Cognition Arousal: Alert Behavior During Therapy: WFL for tasks assessed/performed Overall Cognitive Status: Within Functional Limits for tasks assessed                                          General Comments      Exercises Total Joint Exercises Ankle Circles/Pumps: Supine, 10 reps, Right, AROM, Strengthening Quad Sets: AROM, Strengthening, Right, 10 reps, Supine Short Arc Quad: AROM, Strengthening, Right, 10 reps, Supine Heel Slides: AROM, Strengthening, Right, 10 reps, Supine Goniometric ROM: Right Knee: 0 - 90 degrees   Assessment/Plan    PT Assessment Patient needs continued PT services  PT Problem List Decreased strength;Decreased activity tolerance;Decreased balance;Decreased mobility;Decreased range of motion       PT Treatment Interventions DME instruction;Gait training;Stair training;Functional mobility training;Therapeutic activities;Therapeutic exercise;Balance training;Patient/family education    PT Goals (Current goals can be found in the Care Plan section)  Acute Rehab PT  Goals Patient Stated Goal: return home with family/friends to assist PT Goal Formulation: With patient Time For Goal Achievement: 04/14/23 Potential to Achieve Goals: Good    Frequency BID     Co-evaluation               AM-PAC PT "6 Clicks" Mobility  Outcome Measure Help needed turning from your back to your side while in a flat bed without using bedrails?: None Help needed moving from lying on your back to sitting on the side of a flat bed without using bedrails?: A Little Help needed moving to and from a bed to a chair (including a wheelchair)?: A Little Help needed standing up from a chair using your arms (e.g., wheelchair or bedside chair)?: A Little Help needed to walk in hospital room?: A Little Help needed climbing 3-5 steps with a railing? : A Little 6 Click Score: 19    End of Session   Activity Tolerance: Patient tolerated treatment well;Patient limited by fatigue Patient left: in chair;with call bell/phone within reach;with family/visitor present Nurse Communication: Mobility status PT Visit Diagnosis: Unsteadiness on feet (R26.81);Other abnormalities of gait and mobility (R26.89);Muscle weakness (generalized) (M62.81)    Time: 1610-9604 PT Time Calculation (min) (ACUTE ONLY): 30 min   Charges:   PT Evaluation $PT Eval Moderate Complexity: 1 Mod PT Treatments $Therapeutic Activity: 23-37 mins PT General Charges $$ ACUTE PT VISIT: 1 Visit         3:08  PM, 04/12/23 Ocie Bob, MPT Physical Therapist with [facility] [phone] office [phone] mobile phone

## 2023-04-12 NOTE — Anesthesia Preprocedure Evaluation (Signed)
Anesthesia Evaluation  Patient identified by MRN, date of birth, ID band Patient awake    Reviewed: Allergy & Precautions, H&P , NPO status , Patient's Chart, lab work & pertinent test results, reviewed documented beta blocker date and time   History of Anesthesia Complications (+) history of anesthetic complications  Airway Mallampati: II  TM Distance: >3 FB Neck ROM: full    Dental no notable dental hx.    Pulmonary neg pulmonary ROS, sleep apnea , former smoker   Pulmonary exam normal breath sounds clear to auscultation       Cardiovascular Exercise Tolerance: Good negative cardio ROS  Rhythm:regular Rate:Normal     Neuro/Psych  PSYCHIATRIC DISORDERS Anxiety Depression    negative neurological ROS  negative psych ROS   GI/Hepatic negative GI ROS, Neg liver ROS,GERD  ,,  Endo/Other  negative endocrine ROS    Renal/GU negative Renal ROS  negative genitourinary   Musculoskeletal   Abdominal   Peds  Hematology negative hematology ROS (+)   Anesthesia Other Findings   Reproductive/Obstetrics negative OB ROS                             Anesthesia Physical Anesthesia Plan  ASA: 2  Anesthesia Plan: General and General LMA   Post-op Pain Management: Regional block*   Induction:   PONV Risk Score and Plan: Ondansetron  Airway Management Planned:   Additional Equipment:   Intra-op Plan:   Post-operative Plan:   Informed Consent: I have reviewed the patients History and Physical, chart, labs and discussed the procedure including the risks, benefits and alternatives for the proposed anesthesia with the patient or authorized representative who has indicated his/her understanding and acceptance.     Dental Advisory Given  Plan Discussed with: CRNA  Anesthesia Plan Comments:        Anesthesia Quick Evaluation

## 2023-04-12 NOTE — Brief Op Note (Signed)
04/12/2023  9:38 AM  PATIENT:  [name]  67 y.o. male  PRE-OPERATIVE DIAGNOSIS:  Osteoarthritis right knee  POST-OPERATIVE DIAGNOSIS:  Osteoarthritis right knee  PROCEDURE:  Procedure(s): TOTAL KNEE ARTHROPLASTY (Right)  SURGEON:  Surgeons and Role:    Vickki Hearing, MD - Primary  PHYSICIAN ASSISTANT:   ASSISTANTS: cynthai and harley    ANESTHESIA:   general  EBL:  150 mL   BLOOD ADMINISTERED:none  DRAINS: none   LOCAL MEDICATIONS USED:  OTHER zinrelef  SPECIMEN:  No Specimen  DISPOSITION OF SPECIMEN:  N/A  COUNTS:  YES  TOURNIQUET:   Total Tourniquet Time Documented: Thigh (Right) - 87 minutes Total: Thigh (Right) - 87 minutes   DICTATION: .Reubin Milan Dictation  PLAN OF CARE: Admit for overnight observation  PATIENT DISPOSITION:  PACU - hemodynamically stable.   Delay start of Pharmacological VTE agent (>24hrs) due to surgical blood loss or risk of bleeding: not applicable

## 2023-04-13 DIAGNOSIS — Z87891 Personal history of nicotine dependence: Secondary | ICD-10-CM | POA: Diagnosis not present

## 2023-04-13 DIAGNOSIS — Z96652 Presence of left artificial knee joint: Secondary | ICD-10-CM | POA: Diagnosis not present

## 2023-04-13 DIAGNOSIS — M1711 Unilateral primary osteoarthritis, right knee: Secondary | ICD-10-CM | POA: Diagnosis not present

## 2023-04-13 LAB — BASIC METABOLIC PANEL
Anion gap: 9 (ref 5–15)
BUN: 16 mg/dL (ref 8–23)
CO2: 26 mmol/L (ref 22–32)
Calcium: 8.5 mg/dL — ABNORMAL LOW (ref 8.9–10.3)
Chloride: 101 mmol/L (ref 98–111)
Creatinine, Ser: 0.96 mg/dL (ref 0.61–1.24)
GFR, Estimated: >60 mL/min (ref >60)
Glucose, Bld: 140 mg/dL — ABNORMAL HIGH (ref 70–99)
Potassium: 4 mmol/L (ref 3.5–5.1)
Sodium: 136 mmol/L (ref 135–145)

## 2023-04-13 MED ORDER — DOCUSATE SODIUM 100 MG PO CAPS: 100.0000 mg | ORAL_CAPSULE | Freq: Two times a day (BID) | ORAL | 0 refills | Status: DC

## 2023-04-13 MED ORDER — TRAMADOL HCL 50 MG PO TABS: 50.0000 mg | ORAL_TABLET | Freq: Four times a day (QID) | ORAL | 0 refills | Status: DC

## 2023-04-13 MED ORDER — POLYETHYLENE GLYCOL 3350 17 G PO PACK: 17.0000 g | PACK | Freq: Every day | ORAL | 0 refills | Status: DC

## 2023-04-13 MED ORDER — HYDROCODONE-ACETAMINOPHEN 10-325 MG PO TABS: 1.0000 | ORAL_TABLET | ORAL | 0 refills | Status: DC | PRN

## 2023-04-13 MED ORDER — ASPIRIN 81 MG PO CHEW: 81.0000 mg | CHEWABLE_TABLET | Freq: Two times a day (BID) | ORAL | 0 refills | Status: DC

## 2023-04-13 MED ORDER — METHOCARBAMOL 500 MG PO TABS: 500.0000 mg | ORAL_TABLET | Freq: Four times a day (QID) | ORAL | 2 refills | Status: DC | PRN

## 2023-04-13 NOTE — Anesthesia Postprocedure Evaluation (Signed)
Anesthesia Post Note  Patient: Matsuo Rezendes Dimperio  Procedure(s) Performed: TOTAL KNEE ARTHROPLASTY (Right: Knee)  Patient location during evaluation: Phase II Anesthesia Type: General Level of consciousness: awake Pain management: pain level controlled Vital Signs Assessment: post-procedure vital signs reviewed and stable Respiratory status: spontaneous breathing and respiratory function stable Cardiovascular status: blood pressure returned to baseline and stable Postop Assessment: no headache and no apparent nausea or vomiting Anesthetic complications: no Comments: Late entry   No notable events documented.   Last Vitals:  Vitals:   04/12/23 2330 04/13/23 0459  BP: 130/80 (!) 149/88  Pulse: 92 97  Resp: 18 20  Temp: 36.7 C 37.2 C  SpO2: 96% 99%    Last Pain:  Vitals:   04/13/23 0908  TempSrc:   PainSc: 5                  Windell Norfolk

## 2023-04-13 NOTE — Progress Notes (Signed)
Patient ID: [name], male   DOB: 1956/02/20, 67 y.o.   MRN: [id number] Postop day 1 right total knee arthroplasty  BP (!) 149/88 (BP Location: Right Arm)   Pulse 97   Temp 98.9 F (37.2 C)   Resp 20   SpO2 99%   Pain is well-controlled  Exam today patient was awake alert oriented x 3 calf is supple DVT signs negative wound, dressing was clean     Latest Ref Rng & Units 04/12/2023   12:31 PM 04/06/2023    8:29 AM 11/12/2020    5:19 AM  CBC  WBC 4.0 - 10.5 K/uL 10.9  7.0  14.0   Hemoglobin 13.0 - 17.0 g/dL 40.9  81.1  91.4   Hematocrit 39.0 - 52.0 % 39.3  41.8  37.6   Platelets 150 - 400 K/uL 211  258  234       Latest Ref Rng & Units 04/13/2023    4:23 AM 04/06/2023    8:29 AM 11/12/2020    5:19 AM  BMP  Glucose 70 - 99 mg/dL 782  956  213   BUN 8 - 23 mg/dL 16  13  20    Creatinine 0.61 - 1.24 mg/dL 0.86  5.78  4.69   Sodium 135 - 145 mmol/L 136  135  136   Potassium 3.5 - 5.1 mmol/L 4.0  4.3  4.5   Chloride 98 - 111 mmol/L 101  100  101   CO2 22 - 32 mmol/L 26  23  25    Calcium 8.9 - 10.3 mg/dL 8.5  9.3  9.1    RIGHT KNEE ROM: 0 - 90 degrees AMBULATION DISTANCE: 100 feet using RW with Supervision  The patient can be discharged today follow-up in about 2 weeks to get the staples removed Home physical therapy Home CPM already delivered Cryo/Cuff Bone foam

## 2023-04-13 NOTE — Progress Notes (Signed)
Patient has rested well this shift.  Patient pain has been controlled with scheduled medication. Vitals stable, neuro checks have been wnl.  Patient foley has been removed, and patient has voided this am. Patient did ambulated to restroom with assistance from staff and walker. No acute events overnight.

## 2023-04-13 NOTE — Discharge Summary (Signed)
Physician Discharge Summary  Patient ID: [name] MRN: [id number] DOB/AGE: 1955/12/27 67 y.o.  Admit date: 04/12/2023 Discharge date: 04/13/2023  Admission Diagnoses: Osteoarthritis right knee  Discharge Diagnoses: Arthritis right knee  Discharged Condition: good  Procedure: Right total knee arthroplasty attune fixed-bearing posterior stabilized total knee size 6 femur size 6 tibia size 5 polyethylene insert size 35 patella  Hospital Course:  Implant Name Type Inv. Item Serial No. Manufacturer Lot No. LRB No. Used Action  CEMENT HV SMART SET - [id number] Cement CEMENT HV SMART SET  [employer] [id number] Right 2 Implanted  BSPLAT TIB 6 CMNT FXBRNG STRL - [id number] Knees BSPLAT TIB 6 CMNT FXBRNG STRL  DEPU[employer]TH[employer] [id number] Right 1 Implanted  PATELLA MEDIAL ATTUN KNEE - [id number] Knees PATELLA MEDIAL ATTUN KNEE  DEPUY ORTH[employer] [id number] Right 1 Implanted  ATTUNE PS FEM RT SZ 6 CEM KNEE - [id number] Femur ATTUNE PS FEM RT SZ 6 CEM KNEE  DEPU[employer] [id number] Right 1 Implanted  INSERT TIB PS FB ATTUNE SZ6X5 - [id number] Knees INSERT TIB PS FB ATTUNE SZ6X5  DEPU[employer]TH[employer] [id number] Right 1 Implanted         Discharge Exam: BP (!) 149/88 (BP Location: Right Arm)   Pulse 97   Temp 98.9 F (37.2 C)   Resp 20   SpO2 99%  Physical Exam Vitals and nursing note reviewed.  Constitutional:      Appearance: Normal appearance.  HENT:     Head: Normocephalic and atraumatic.  Eyes:     General: No scleral icterus.       Right eye: No discharge.        Left eye: No discharge.     Extraocular Movements: Extraocular movements intact.     Conjunctiva/sclera: Conjunctivae normal.     Pupils: Pupils are equal, round, and reactive to light.  Cardiovascular:     Rate and Rhythm: Normal rate.     Pulses: Normal pulses.  Musculoskeletal:     Comments: Neurovascular exam is intact for his lower and upper extremities his dressing is dry he has no  signs of DVT  Skin:    General: Skin is warm and dry.     Capillary Refill: Capillary refill takes less than 2 seconds.  Neurological:     General: No focal deficit present.     Mental Status: He is alert and oriented to person, place, and time.  Psychiatric:        Mood and Affect: Mood normal.        Behavior: Behavior normal.        Thought Content: Thought content normal.        Judgment: Judgment normal.       Disposition: Discharge disposition: 01-Home or Self Care       Discharge Instructions     Call MD / Call 911   Complete by: As directed    If you experience chest pain or shortness of breath, CALL 911 and be transported to the hospital emergency room.  If you develope a fever above 101 F, pus (white drainage) or increased drainage or redness at the wound, or calf pain, call your surgeon's office.   Constipation Prevention   Complete by: As directed    Drink plenty of fluids.  Prune juice may be helpful.  You may use a stool softener, such as Colace (over the counter) 100 mg twice a day.  Use MiraLax (over the counter) for constipation as needed.   Diet -  low sodium heart healthy   Complete by: As directed    Discharge instructions   Complete by: As directed    Continue exercises that you were taught in the hospital  CPM machine 6 hours a day start at 75 degrees advance 10 degrees/day as tolerated up to 120 degrees or maximum allowed on the CPM machine  Blue bone foam 30 minutes 3 times a day  Gray ice cuff: Change the water and ice every 6-8 hours as needed ice the knee 6 times a day for 30 minutes  White stockings wear for 4 weeks  Take 1 baby aspirin twice a day to prevent blood clots  Dressing do not change  No shower for 2 weeks   Increase activity slowly as tolerated   Complete by: As directed    Post-operative opioid taper instructions:   Complete by: As directed    POST-OPERATIVE OPIOID TAPER INSTRUCTIONS: It is important to wean off of your  opioid medication as soon as possible. If you do not need pain medication after your surgery it is ok to stop day one. Opioids include: Codeine, Hydrocodone(Norco, Vicodin), Oxycodone(Percocet, oxycontin) and hydromorphone amongst others.  Long term and even short term use of opiods can cause: Increased pain response Dependence Constipation Depression Respiratory depression And more.  Withdrawal symptoms can include Flu like symptoms Nausea, vomiting And more Techniques to manage these symptoms Hydrate well Eat regular healthy meals Stay active Use relaxation techniques(deep breathing, meditating, yoga) Do Not substitute Alcohol to help with tapering If you have been on opioids for less than two weeks and do not have pain than it is ok to stop all together.  Plan to wean off of opioids This plan should start within one week post op of your joint replacement. Maintain the same interval or time between taking each dose and first decrease the dose.  Cut the total daily intake of opioids by one tablet each day Next start to increase the time between doses. The last dose that should be eliminated is the evening dose.         Allergies as of 04/13/2023       Reactions   Codeine Other (See Comments)   Out of right state of mind.    Meloxicam Itching        Medication List     TAKE these medications    aspirin 81 MG chewable tablet Chew 1 tablet (81 mg total) by mouth 2 (two) times daily.   atorvastatin 40 MG tablet Commonly known as: LIPITOR Take 40 mg by mouth at bedtime.   docusate sodium 100 MG capsule Commonly known as: COLACE Take 1 capsule (100 mg total) by mouth 2 (two) times daily.   escitalopram 20 MG tablet Commonly known as: LEXAPRO Take 20 mg by mouth every evening.   HYDROcodone-acetaminophen 10-325 MG tablet Commonly known as: Norco Take 1 tablet by mouth every 4 (four) hours as needed.   methocarbamol 500 MG tablet Commonly known as:  ROBAXIN Take 1 tablet (500 mg total) by mouth every 6 (six) hours as needed for muscle spasms.   phentermine 37.5 MG capsule Take 37.5 mg by mouth every morning.   polyethylene glycol 17 g packet Commonly known as: MIRALAX / GLYCOLAX Take 17 g by mouth daily.   traMADol 50 MG tablet Commonly known as: ULTRAM Take 1 tablet (50 mg total) by mouth every 6 (six) hours.        Follow-up Information     Romeo Apple,  Fernande Boyden, MD Follow up on 04/27/2023.   Specialties: Orthopedic Surgery, Radiology Why: For wound re-check, For suture removal Contact information: [address] [place] [address] [phone]                 Signed: Fuller Canada 04/13/2023, 9:23 AM

## 2023-04-13 NOTE — Progress Notes (Signed)
Physical Therapy Treatment Patient Details Name: [name] MRN: [id number] DOB: [birth year] Today's Date: 04/13/2023  RIGHT KNEE ROM: 0 - 100 degrees AMBULATION DISTANCE: 150 feet using RW with Modified Independence/Supervision   History of Present Illness [name] is a 67 y/o male, s/p Right TKA on 04/12/23, with the diagnosis of Osteoarthritis right knee.    PT Comments  Patient demonstrates good return for completing right knee exercises, going up/down steps in stairwell using bilateral side rails without loss of balance and understanding acknowledged.  Patient demonstrates fair/good return for right heel to toe stepping during gait training without loss of balance and tolerated sitting up in chair with RLE dangling after therapy.  Patient will benefit from continued skilled physical therapy in hospital and recommended venue below to increase strength, balance, endurance for safe ADLs and gait.     If plan is discharge home, recommend the following: A little help with walking and/or transfers;A little help with bathing/dressing/bathroom;Help with stairs or ramp for entrance;Assistance with cooking/housework   Can travel by private vehicle        Equipment Recommendations  None recommended by PT    Recommendations for Other Services       Precautions / Restrictions Precautions Precautions: Fall Restrictions Weight Bearing Restrictions: Yes RLE Weight Bearing: Weight bearing as tolerated     Mobility  Bed Mobility Overal bed mobility: Modified Independent             General bed mobility comments: good return for using LLE to hook and move RLE during supine to sitting    Transfers Overall transfer level: Needs assistance   Transfers: Sit to/from Stand, Bed to chair/wheelchair/BSC Sit to Stand: Modified independent (Device/Increase time), Supervision   Step pivot transfers: Modified independent (Device/Increase time), Supervision       General  transfer comment: demonstrates fair/good return for proper hand placement during sit to stands after verbal cueing    Ambulation/Gait Ambulation/Gait assistance: Supervision, Modified independent (Device/Increase time) Gait Distance (Feet): 150 Feet Assistive device: Rolling walker (2 wheels) Gait Pattern/deviations: Decreased step length - right, Decreased step length - left, Decreased stance time - right, Decreased stride length, Antalgic Gait velocity: decreased     General Gait Details: slightly increased endurance/distance for gait training with fair/good return for right heel to toe stepping without loss of balance   Stairs Stairs: Yes Stairs assistance: Modified independent (Device/Increase time), Supervision Stair Management: Two rails, Step to pattern Number of Stairs: 5 General stair comments: demonstrates good return for going up/down steps in stair well using bilateral side rails without loss of balance and understanding acknowledged   Wheelchair Mobility     Tilt Bed    Modified Rankin (Stroke Patients Only)       Balance Overall balance assessment: Needs assistance Sitting-balance support: Feet supported, No upper extremity supported Sitting balance-Leahy Scale: Good Sitting balance - Comments: seated at EOB   Standing balance support: During functional activity, Bilateral upper extremity supported, Reliant on assistive device for balance Standing balance-Leahy Scale: Fair Standing balance comment: fair/good using RW                            Cognition Arousal: Alert Behavior During Therapy: WFL for tasks assessed/performed Overall Cognitive Status: Within Functional Limits for tasks assessed  Exercises Total Joint Exercises Ankle Circles/Pumps: Supine, 10 reps, Right, AROM, Strengthening Quad Sets: AROM, Strengthening, Right, 10 reps, Supine Short Arc Quad: AROM, Strengthening,  Right, 10 reps, Supine Heel Slides: AROM, Strengthening, Right, 10 reps, Supine Goniometric ROM: Right Knee: 0 - 100 degrees    General Comments        Pertinent Vitals/Pain Pain Assessment Pain Assessment: 0-10 Pain Score: 2  Pain Location: right knee Pain Descriptors / Indicators: Sore, Discomfort Pain Intervention(s): Limited activity within patient's tolerance, Monitored during session, Repositioned    Home Living                          Prior Function            PT Goals (current goals can now be found in the care plan section) Acute Rehab PT Goals Patient Stated Goal: return home with family/friends to assist PT Goal Formulation: With patient Time For Goal Achievement: 04/14/23 Potential to Achieve Goals: Good Progress towards PT goals: Progressing toward goals    Frequency    BID      PT Plan      Co-evaluation              AM-PAC PT "6 Clicks" Mobility   Outcome Measure  Help needed turning from your back to your side while in a flat bed without using bedrails?: None Help needed moving from lying on your back to sitting on the side of a flat bed without using bedrails?: None Help needed moving to and from a bed to a chair (including a wheelchair)?: A Little Help needed standing up from a chair using your arms (e.g., wheelchair or bedside chair)?: A Little Help needed to walk in hospital room?: A Little Help needed climbing 3-5 steps with a railing? : A Little 6 Click Score: 20    End of Session   Activity Tolerance: Patient tolerated treatment well;Patient limited by fatigue Patient left: in chair;with call bell/phone within reach Nurse Communication: Mobility status PT Visit Diagnosis: Unsteadiness on feet (R26.81);Other abnormalities of gait and mobility (R26.89);Muscle weakness (generalized) (M62.81)     Time: 4010-2725 PT Time Calculation (min) (ACUTE ONLY): 28 min  Charges:    $Gait Training: 8-22 mins $Therapeutic  Exercise: 8-22 mins PT General Charges $$ ACUTE PT VISIT: 1 Visit                     9:14 AM, 04/13/23 Ocie Bob, MPT Physical Therapist with [facility] [phone] office [phone] mobile phone

## 2023-04-14 DIAGNOSIS — E7800 Pure hypercholesterolemia, unspecified: Secondary | ICD-10-CM | POA: Diagnosis not present

## 2023-04-14 DIAGNOSIS — Z471 Aftercare following joint replacement surgery: Secondary | ICD-10-CM | POA: Diagnosis not present

## 2023-04-14 DIAGNOSIS — Z7982 Long term (current) use of aspirin: Secondary | ICD-10-CM | POA: Diagnosis not present

## 2023-04-14 DIAGNOSIS — Z96653 Presence of artificial knee joint, bilateral: Secondary | ICD-10-CM | POA: Diagnosis not present

## 2023-04-14 DIAGNOSIS — Z981 Arthrodesis status: Secondary | ICD-10-CM | POA: Diagnosis not present

## 2023-04-14 DIAGNOSIS — G4730 Sleep apnea, unspecified: Secondary | ICD-10-CM | POA: Diagnosis not present

## 2023-04-14 DIAGNOSIS — Z87891 Personal history of nicotine dependence: Secondary | ICD-10-CM | POA: Diagnosis not present

## 2023-04-14 DIAGNOSIS — K219 Gastro-esophageal reflux disease without esophagitis: Secondary | ICD-10-CM | POA: Diagnosis not present

## 2023-04-14 LAB — BPAM RBC
Blood Product Expiration Date: 202410082359
Blood Product Expiration Date: 202410112359
Unit Type and Rh: 5100
Unit Type and Rh: 5100

## 2023-04-14 LAB — TYPE AND SCREEN
ABO/RH(D): O POS
Antibody Screen: NEGATIVE
Unit division: 0
Unit division: 0

## 2023-04-16 DIAGNOSIS — Z87891 Personal history of nicotine dependence: Secondary | ICD-10-CM | POA: Diagnosis not present

## 2023-04-16 DIAGNOSIS — G4730 Sleep apnea, unspecified: Secondary | ICD-10-CM | POA: Diagnosis not present

## 2023-04-16 DIAGNOSIS — E7800 Pure hypercholesterolemia, unspecified: Secondary | ICD-10-CM | POA: Diagnosis not present

## 2023-04-16 DIAGNOSIS — Z96653 Presence of artificial knee joint, bilateral: Secondary | ICD-10-CM | POA: Diagnosis not present

## 2023-04-16 DIAGNOSIS — Z981 Arthrodesis status: Secondary | ICD-10-CM | POA: Diagnosis not present

## 2023-04-16 DIAGNOSIS — K219 Gastro-esophageal reflux disease without esophagitis: Secondary | ICD-10-CM | POA: Diagnosis not present

## 2023-04-16 DIAGNOSIS — Z7982 Long term (current) use of aspirin: Secondary | ICD-10-CM | POA: Diagnosis not present

## 2023-04-16 DIAGNOSIS — Z471 Aftercare following joint replacement surgery: Secondary | ICD-10-CM | POA: Diagnosis not present

## 2023-04-18 DIAGNOSIS — Z981 Arthrodesis status: Secondary | ICD-10-CM | POA: Diagnosis not present

## 2023-04-18 DIAGNOSIS — Z471 Aftercare following joint replacement surgery: Secondary | ICD-10-CM | POA: Diagnosis not present

## 2023-04-18 DIAGNOSIS — Z7982 Long term (current) use of aspirin: Secondary | ICD-10-CM | POA: Diagnosis not present

## 2023-04-18 DIAGNOSIS — K219 Gastro-esophageal reflux disease without esophagitis: Secondary | ICD-10-CM | POA: Diagnosis not present

## 2023-04-18 DIAGNOSIS — E7800 Pure hypercholesterolemia, unspecified: Secondary | ICD-10-CM | POA: Diagnosis not present

## 2023-04-18 DIAGNOSIS — Z87891 Personal history of nicotine dependence: Secondary | ICD-10-CM | POA: Diagnosis not present

## 2023-04-18 DIAGNOSIS — Z96653 Presence of artificial knee joint, bilateral: Secondary | ICD-10-CM | POA: Diagnosis not present

## 2023-04-18 DIAGNOSIS — G4730 Sleep apnea, unspecified: Secondary | ICD-10-CM | POA: Diagnosis not present

## 2023-04-19 ENCOUNTER — Encounter (HOSPITAL_COMMUNITY): Payer: Self-pay | Admitting: Orthopedic Surgery

## 2023-04-20 DIAGNOSIS — Z7982 Long term (current) use of aspirin: Secondary | ICD-10-CM | POA: Diagnosis not present

## 2023-04-20 DIAGNOSIS — E7800 Pure hypercholesterolemia, unspecified: Secondary | ICD-10-CM | POA: Diagnosis not present

## 2023-04-20 DIAGNOSIS — Z87891 Personal history of nicotine dependence: Secondary | ICD-10-CM | POA: Diagnosis not present

## 2023-04-20 DIAGNOSIS — G4730 Sleep apnea, unspecified: Secondary | ICD-10-CM | POA: Diagnosis not present

## 2023-04-20 DIAGNOSIS — K219 Gastro-esophageal reflux disease without esophagitis: Secondary | ICD-10-CM | POA: Diagnosis not present

## 2023-04-20 DIAGNOSIS — Z471 Aftercare following joint replacement surgery: Secondary | ICD-10-CM | POA: Diagnosis not present

## 2023-04-20 DIAGNOSIS — Z981 Arthrodesis status: Secondary | ICD-10-CM | POA: Diagnosis not present

## 2023-04-20 DIAGNOSIS — Z96653 Presence of artificial knee joint, bilateral: Secondary | ICD-10-CM | POA: Diagnosis not present

## 2023-04-22 DIAGNOSIS — Z96653 Presence of artificial knee joint, bilateral: Secondary | ICD-10-CM | POA: Diagnosis not present

## 2023-04-22 DIAGNOSIS — Z87891 Personal history of nicotine dependence: Secondary | ICD-10-CM | POA: Diagnosis not present

## 2023-04-22 DIAGNOSIS — Z981 Arthrodesis status: Secondary | ICD-10-CM | POA: Diagnosis not present

## 2023-04-22 DIAGNOSIS — Z7982 Long term (current) use of aspirin: Secondary | ICD-10-CM | POA: Diagnosis not present

## 2023-04-22 DIAGNOSIS — G4730 Sleep apnea, unspecified: Secondary | ICD-10-CM | POA: Diagnosis not present

## 2023-04-22 DIAGNOSIS — K219 Gastro-esophageal reflux disease without esophagitis: Secondary | ICD-10-CM | POA: Diagnosis not present

## 2023-04-22 DIAGNOSIS — Z471 Aftercare following joint replacement surgery: Secondary | ICD-10-CM | POA: Diagnosis not present

## 2023-04-22 DIAGNOSIS — E7800 Pure hypercholesterolemia, unspecified: Secondary | ICD-10-CM | POA: Diagnosis not present

## 2023-04-22 NOTE — Therapy (Signed)
OUTPATIENT PHYSICAL THERAPY LOWER EXTREMITY EVALUATION   Patient Name: [name] MRN: [id number] DOB:[birth year], 67 y.o., male Today's Date: 04/26/2023  END OF SESSION:  PT End of Session - 04/26/23 0929     Visit Number 1    Number of Visits 8    Date for PT Re-Evaluation 05/24/23    Authorization Type [employer] Medicare, no auth, no VL    Progress Note Due on Visit 8    PT Start Time 0929    PT Stop Time 1010    PT Time Calculation (min) 41 min    Activity Tolerance Patient tolerated treatment well    Behavior During Therapy [facility] for tasks assessed/performed             Past Medical History:  Diagnosis Date   Anxiety    some panic attacks- off medications   Arthritis    Complication of anesthesia    woke up during surgery- Elbow surgery, 2009 and again with foot surgeryry    Depression    GERD (gastroesophageal reflux disease)    once in awhile   Hypercholesteremia    Sleep apnea    has lost 'abunch of weight' its been yrs since i used it   Past Surgical History:  Procedure Laterality Date   ANTERIOR CERVICAL DECOMP/DISCECTOMY FUSION N/A 12/21/2017   Procedure: C3-4 ANTERIOR CERVICAL DECOMPRESSION/DISCECTOMY FUSION, ALLOGRAFT, PLATE;  Surgeon: Eldred Manges, MD;  Location: [facility] OR;  Service: Orthopedics;  Laterality: N/A;   CATARACT EXTRACTION Right    CATARACT EXTRACTION Left    COLONOSCOPY  2008   Dr. Darrick Penna: Hyperplastic polyp removed, diverticulosis.   COLONOSCOPY WITH PROPOFOL N/A 07/11/2018   Procedure: COLONOSCOPY WITH PROPOFOL;  Surgeon: West Bali, MD;  Location: [facility] ENDO SUITE;  Service: Endoscopy;  Laterality: N/A;  7:30am   FOOT SURGERY     left elbow      LUMBAR LAMINECTOMY     back in 2015   MASS EXCISION Left 09/12/2019   Procedure: EXCISION GOUTY TOPHI;  Surgeon: Ferman Hamming, DPM;  Location: [facility] ORS;  Service: Podiatry;  Laterality: Left;   POLYPECTOMY  07/11/2018   Procedure: POLYPECTOMY;  Surgeon: West Bali, MD;  Location: [facility]  ENDO SUITE;  Service: Endoscopy;;  colon   TOTAL KNEE ARTHROPLASTY Left 11/11/2020   Procedure: LEFT TOTAL KNEE ARTHROPLASTY;  Surgeon: Vickki Hearing, MD;  Location: [facility] ORS;  Service: Orthopedics;  Laterality: Left;   TOTAL KNEE ARTHROPLASTY Right 04/12/2023   Procedure: TOTAL KNEE ARTHROPLASTY;  Surgeon: Vickki Hearing, MD;  Location: [facility] ORS;  Service: Orthopedics;  Laterality: Right;   Patient Active Problem List   Diagnosis Date Noted   Primary localized osteoarthritis of right knee 04/12/2023   S/P total knee replacement, left 11/11/20 11/25/2020   Primary osteoarthritis of right knee 11/11/2020   Primary osteoarthritis of left knee    Encounter for screening colonoscopy 05/09/2018   Polyphagia 05/09/2018   Complication of anesthesia 05/09/2018   Superior labrum anterior-to-posterior (SLAP) tear of right shoulder 03/29/2018   History of fusion of cervical spine 12/28/2017   Status post lumbar spinal fusion 11/15/2017   RESTLESS LEGS SYNDROME 08/02/2009   HYPERLIPIDEMIA 07/23/2009   OBSTRUCTIVE SLEEP APNEA 07/23/2009    PCP: Carylon Perches MD  REFERRING PROVIDER: Vickki Hearing, MD  REFERRING DIAG: M17.11 (ICD-10-CM) - Arthritis of right knee  THERAPY DIAG:  Difficulty in walking, not elsewhere classified - Plan: PT plan of care cert/re-cert  Stiffness of right knee, not  elsewhere classified - Plan: PT plan of care cert/re-cert  Acute pain of right knee - Plan: PT plan of care cert/re-cert  Primary osteoarthritis of right knee - Plan: PT plan of care cert/re-cert  Rationale for Evaluation and Treatment: Rehabilitation  ONSET DATE: 04/12/23  SUBJECTIVE:   SUBJECTIVE STATEMENT: Right knee "worn out"; surgery 04/12/23 per Dr. Romeo Apple.  Went home the next day; had home health for a little bit and then sent for outpatient therapy.  Arrives today with a SPC.  Walking with cane about a week; sees [employer] 04/27/23; waterproof bandage remains  PERTINENT  HISTORY: Total knee replacement right planned for 04/12/23 needs evaluate and treat physical therapy to start on or about Oct 8th; history of L TKA PAIN:  Are you having pain? Yes: NPRS scale: 2/10 Pain location: Right knee Pain description: throbbing, tight, dull Aggravating factors: unknown Relieving factors: medication  PRECAUTIONS: None  RED FLAGS: None   WEIGHT BEARING RESTRICTIONS: No  FALLS:  Has patient fallen in last 6 months? No  LIVING ENVIRONMENT: Lives with: lives alone Lives in: Mobile home Stairs: Yes: External: 4 steps; none Has following equipment at home: Single point cane, Walker - 2 wheeled, shower chair, and bed side commode  OCCUPATION: do little part time jobs; wash dishes at [employer]  PLOF: Independent  PATIENT GOALS: be able to walk good  NEXT MD VISIT: 04/27/23  OBJECTIVE:  Note: Objective measures were completed at Evaluation unless otherwise noted.  DIAGNOSTIC FINDINGS: CLINICAL DATA:  Status post knee arthroplasty.   EXAM: PORTABLE RIGHT KNEE - 1-2 VIEW   COMPARISON:  None Available.   FINDINGS: Right knee arthroplasty in expected alignment. No periprosthetic lucency or fracture. There has been patellar resurfacing. Recent postsurgical change includes air and edema in the soft tissues and joint space. Anterior skin staples.   IMPRESSION: Right knee arthroplasty without immediate postoperative complication.  PATIENT SURVEYS:  FOTO 58  COGNITION: Overall cognitive status: Within functional limits for tasks assessed     SENSATION: WFL  EDEMA:  Normal for this time s/p   POSTURE: No Significant postural limitations  PALPATION: Normal for this time s/p  LOWER EXTREMITY ROM:  Active ROM Right eval Left eval  Hip flexion    Hip extension    Hip abduction    Hip adduction    Hip internal rotation    Hip external rotation    Knee flexion 110 130  Knee extension -5 (lacking) 0  Ankle dorsiflexion    Ankle  plantarflexion    Ankle inversion    Ankle eversion     (Blank rows = not tested)  LOWER EXTREMITY MMT:  MMT Right eval Left eval  Hip flexion 4+ 5  Hip extension    Hip abduction    Hip adduction    Hip internal rotation    Hip external rotation    Knee flexion (sitting mid range isometric) 4- 5  Knee extension 4- 5  Ankle dorsiflexion 4+ 5  Ankle plantarflexion    Ankle inversion    Ankle eversion     (Blank rows = not tested) FUNCTIONAL TESTS:  5 times sit to stand: 18.51 sec no UE assist 2 minute walk test: 319 ft with SPC  GAIT: Distance walked: 319 ft Assistive device utilized: Single point cane Level of assistance: Modified independence Comments: slight antalgic gait   TODAY'S TREATMENT:  DATE: 04/26/23 physical therapy evaluation and HEP instruction     PATIENT EDUCATION:  Education details: Patient educated on exam findings, POC, scope of PT, HEP, and what to expect next visit. Person educated: Patient Education method: Explanation, Demonstration, and Handouts Education comprehension: verbalized understanding, returned demonstration, verbal cues required, and tactile cues required  HOME EXERCISE PROGRAM: Access Code: [email or web address] URL: [email or web address] Date: 04/26/2023 Prepared by: [facility] - Rehab  Exercises - Sit to Stand  - 2 x daily - 7 x weekly - 1 sets - 10 reps - Supine Knee Extension Stretch on Towel Roll  - 2 x daily - 7 x weekly - 1 sets - 1 reps - 5 min hold - Prone Knee Extension Hang  - 2 x daily - 7 x weekly - 1 sets - 1 reps - 5 min hold  ASSESSMENT:  CLINICAL IMPRESSION: Patient is a 67 y.o. male who was seen today for physical therapy evaluation and treatment for M17.11 (ICD-10-CM) - Arthritis of right knee. Patient demonstrates muscle weakness, reduced ROM, and fascial restrictions which are likely  contributing to symptoms of pain and are negatively impacting patient ability to perform ADLs and functional mobility tasks. Patient will benefit from skilled physical therapy services to address these deficits to reduce pain and improve level of function with ADLs and functional mobility tasks.   OBJECTIVE IMPAIRMENTS: Abnormal gait, difficulty walking, decreased ROM, decreased strength, hypomobility, increased edema, increased fascial restrictions, impaired perceived functional ability, and pain.   ACTIVITY LIMITATIONS: bending, sitting, standing, squatting, stairs, transfers, and locomotion level  PARTICIPATION LIMITATIONS: meal prep, cleaning, laundry, community activity, occupation, and yard work  [facility] POTENTIAL: Good  CLINICAL DECISION MAKING: Stable/uncomplicated  EVALUATION COMPLEXITY: Low   GOALS: Goals reviewed with patient? No  SHORT TERM GOALS: Target date: 05/10/2023 patient will be independent with initial HEP  Baseline: Goal status: INITIAL  2.  Patient will self report 50% improvement to improve tolerance for functional activity  Baseline:  Goal status: INITIAL  LONG TERM GOALS: Target date: 05/24/2023   Patient will be independent in self management strategies to improve quality of life and functional outcomes.  Baseline:  Goal status: INITIAL  2.  Patient will self report 75% improvement to improve tolerance for functional activity  Baseline:  Goal status: INITIAL  3.  Patient will increase knee mobility to 0 to 120 to promote normal navigation of steps; step over step pattern  Baseline:  Goal status: INITIAL  4.  Patient will increase right leg MMTs to 5/5 without pain to promote return to ambulation community distances with minimal deviation.    Baseline:  Goal status: INITIAL  5.  Patient will increase distance on to 350 ft to demonstrate improved functional mobility walking household and community distances.   Baseline: 319 ft Goal  status: INITIAL  6.  Patient will improve FOTO score by 10  points to demonstrate improved perceived functional mobility Baseline: 55 Goal status: INITIAL   PLAN:  PT FREQUENCY: 2x/week  PT DURATION: 4 weeks  PLANNED INTERVENTIONS: Therapeutic exercises, Therapeutic activity, Neuromuscular re-education, Balance training, Gait training, Patient/Family education, Joint manipulation, Joint mobilization, Stair training, Orthotic/Fit training, DME instructions, Aquatic Therapy, Dry Needling, Electrical stimulation, Spinal manipulation, Spinal mobilization, Cryotherapy, Moist heat, Compression bandaging, scar mobilization, Splintting, Taping, Traction, Ultrasound, Ionotophoresis 4mg /ml Dexamethasone, and Manual therapy   PLAN FOR NEXT SESSION: Review HEP and goals; right knee mobility and strength; gait training and balance   10:08 AM, 04/26/23 Lacie Landry Small Reene Harlacher MPT [place] [facility] [place] [phone]

## 2023-04-25 DIAGNOSIS — Z981 Arthrodesis status: Secondary | ICD-10-CM | POA: Diagnosis not present

## 2023-04-25 DIAGNOSIS — E7800 Pure hypercholesterolemia, unspecified: Secondary | ICD-10-CM | POA: Diagnosis not present

## 2023-04-25 DIAGNOSIS — Z96653 Presence of artificial knee joint, bilateral: Secondary | ICD-10-CM | POA: Diagnosis not present

## 2023-04-25 DIAGNOSIS — K219 Gastro-esophageal reflux disease without esophagitis: Secondary | ICD-10-CM | POA: Diagnosis not present

## 2023-04-25 DIAGNOSIS — Z7982 Long term (current) use of aspirin: Secondary | ICD-10-CM | POA: Diagnosis not present

## 2023-04-25 DIAGNOSIS — Z471 Aftercare following joint replacement surgery: Secondary | ICD-10-CM | POA: Diagnosis not present

## 2023-04-25 DIAGNOSIS — G4730 Sleep apnea, unspecified: Secondary | ICD-10-CM | POA: Diagnosis not present

## 2023-04-25 DIAGNOSIS — Z87891 Personal history of nicotine dependence: Secondary | ICD-10-CM | POA: Diagnosis not present

## 2023-04-26 ENCOUNTER — Other Ambulatory Visit: Payer: Self-pay

## 2023-04-26 ENCOUNTER — Ambulatory Visit (HOSPITAL_COMMUNITY): Payer: Medicare Other | Attending: Orthopedic Surgery

## 2023-04-26 DIAGNOSIS — R262 Difficulty in walking, not elsewhere classified: Secondary | ICD-10-CM | POA: Insufficient documentation

## 2023-04-26 DIAGNOSIS — M25561 Pain in right knee: Secondary | ICD-10-CM | POA: Insufficient documentation

## 2023-04-26 DIAGNOSIS — M25661 Stiffness of right knee, not elsewhere classified: Secondary | ICD-10-CM | POA: Insufficient documentation

## 2023-04-26 DIAGNOSIS — M1711 Unilateral primary osteoarthritis, right knee: Secondary | ICD-10-CM | POA: Insufficient documentation

## 2023-04-27 ENCOUNTER — Encounter: Payer: Self-pay | Admitting: Orthopedic Surgery

## 2023-04-27 ENCOUNTER — Ambulatory Visit (INDEPENDENT_AMBULATORY_CARE_PROVIDER_SITE_OTHER): Payer: Medicare Other | Admitting: Orthopedic Surgery

## 2023-04-27 DIAGNOSIS — G8918 Other acute postprocedural pain: Secondary | ICD-10-CM

## 2023-04-27 DIAGNOSIS — Z96651 Presence of right artificial knee joint: Secondary | ICD-10-CM | POA: Insufficient documentation

## 2023-04-27 MED ORDER — TRAMADOL HCL 50 MG PO TABS: 50.0000 mg | ORAL_TABLET | Freq: Four times a day (QID) | ORAL | 0 refills | Status: DC

## 2023-04-27 MED ORDER — HYDROCODONE-ACETAMINOPHEN 10-325 MG PO TABS: 1.0000 | ORAL_TABLET | Freq: Three times a day (TID) | ORAL | 0 refills | Status: DC | PRN

## 2023-04-27 NOTE — Progress Notes (Signed)
Post op   Chief Complaint  Patient presents with   Post-op Follow-up    Right knee 04/12/23     Encounter Diagnosis  Name Primary?   S/P total knee replacement, right 04/12/23 Yes     Admit date: 04/12/2023 Discharge date: 04/13/2023   Admission Diagnoses: Osteoarthritis right knee   Discharge Diagnoses: Arthritis right knee   Discharged Condition: good   Procedure: Right total knee arthroplasty attune fixed-bearing posterior stabilized total knee size 6 femur size 6 tibia size 5 polyethylene insert size 35 patella   [name] is doing well he is now walking with a cane he is moving his knee very well his flexion is excellent he needs to work on extension just a little bit more  We will refill his hydrocodone and start the opioid taper he will also get some tramadol to decrease the amount of opioid he is taking  He will be on aspirin for the balance of 6 weeks stockings 2 more weeks  Continue therapy follow-up in 4 weeks  Medication List       TAKE these medications     aspirin 81 MG chewable tablet Chew 1 tablet (81 mg total) by mouth 2 (two) times daily.    atorvastatin 40 MG tablet Commonly known as: LIPITOR Take 40 mg by mouth at bedtime.    docusate sodium 100 MG capsule Commonly known as: COLACE Take 1 capsule (100 mg total) by mouth 2 (two) times daily.    escitalopram 20 MG tablet Commonly known as: LEXAPRO Take 20 mg by mouth every evening.    HYDROcodone-acetaminophen 10-325 MG tablet Commonly known as: Norco Take 1 tablet by mouth every 4 (four) hours as needed.    methocarbamol 500 MG tablet Commonly known as: ROBAXIN Take 1 tablet (500 mg total) by mouth every 6 (six) hours as needed for muscle spasms.    phentermine 37.5 MG capsule Take 37.5 mg by mouth every morning.    polyethylene glycol 17 g packet Commonly known as: MIRALAX / GLYCOLAX Take 17 g by mouth daily.    traMADol 50 MG tablet Commonly known as: ULTRAM Take 1 tablet (50 mg  total) by mouth every 6 (six) hours.    Meds ordered this encounter  Medications   HYDROcodone-acetaminophen (NORCO) 10-325 MG tablet    Sig: Take 1 tablet by mouth every 8 (eight) hours as needed.    Dispense:  20 tablet    Refill:  0   traMADol (ULTRAM) 50 MG tablet    Sig: Take 1 tablet (50 mg total) by mouth every 6 (six) hours.    Dispense:  30 tablet    Refill:  0

## 2023-04-28 ENCOUNTER — Other Ambulatory Visit: Payer: Self-pay | Admitting: Orthopedic Surgery

## 2023-04-28 ENCOUNTER — Ambulatory Visit (HOSPITAL_COMMUNITY): Payer: Medicare Other

## 2023-04-28 DIAGNOSIS — M25561 Pain in right knee: Secondary | ICD-10-CM

## 2023-04-28 DIAGNOSIS — R262 Difficulty in walking, not elsewhere classified: Secondary | ICD-10-CM

## 2023-04-28 DIAGNOSIS — M1711 Unilateral primary osteoarthritis, right knee: Secondary | ICD-10-CM | POA: Diagnosis not present

## 2023-04-28 DIAGNOSIS — M25661 Stiffness of right knee, not elsewhere classified: Secondary | ICD-10-CM

## 2023-04-28 NOTE — Therapy (Signed)
OUTPATIENT PHYSICAL THERAPY LOWER EXTREMITY TREATMENT Patient Name: [name] MRN: [id number] DOB:June 07, 1956, 67 y.o., male Today's Date: 04/28/2023  END OF SESSION:  PT End of Session - 04/28/23 0844     Visit Number 2    Number of Visits 8    Date for PT Re-Evaluation 05/24/23    Authorization Type [employer] Medicare, no auth, no VL    Progress Note Due on Visit 8    PT Start Time 0845    PT Stop Time 0925    PT Time Calculation (min) 40 min    Activity Tolerance Patient tolerated treatment well    Behavior During Therapy [facility] for tasks assessed/performed             Past Medical History:  Diagnosis Date   Anxiety    some panic attacks- off medications   Arthritis    Complication of anesthesia    woke up during surgery- Elbow surgery, 2009 and again with foot surgeryry    Depression    GERD (gastroesophageal reflux disease)    once in awhile   Hypercholesteremia    Sleep apnea    has lost 'abunch of weight' its been yrs since i used it   Past Surgical History:  Procedure Laterality Date   ANTERIOR CERVICAL DECOMP/DISCECTOMY FUSION N/A 12/21/2017   Procedure: C3-4 ANTERIOR CERVICAL DECOMPRESSION/DISCECTOMY FUSION, ALLOGRAFT, PLATE;  Surgeon: Eldred Manges, MD;  Location: [facility] OR;  Service: Orthopedics;  Laterality: N/A;   CATARACT EXTRACTION Right    CATARACT EXTRACTION Left    COLONOSCOPY  2008   Dr. Darrick Penna: Hyperplastic polyp removed, diverticulosis.   COLONOSCOPY WITH PROPOFOL N/A 07/11/2018   Procedure: COLONOSCOPY WITH PROPOFOL;  Surgeon: West Bali, MD;  Location: [facility] ENDO SUITE;  Service: Endoscopy;  Laterality: N/A;  7:30am   FOOT SURGERY     left elbow      LUMBAR LAMINECTOMY     back in 2015   MASS EXCISION Left 09/12/2019   Procedure: EXCISION GOUTY TOPHI;  Surgeon: Ferman Hamming, DPM;  Location: [facility] ORS;  Service: Podiatry;  Laterality: Left;   POLYPECTOMY  07/11/2018   Procedure: POLYPECTOMY;  Surgeon: West Bali, MD;  Location: [facility] END[facility]  SU[facility];  Service: Endoscopy;;  colon   TOTAL KNEE ARTHROPLASTY Left 11/11/2020   Procedure: LEFT TOTAL KNEE ARTHROPLASTY;  Surgeon: Vickki Hearing, MD;  Location: [facility] ORS;  Service: Orthopedics;  Laterality: Left;   TOTAL KNEE ARTHROPLASTY Right 04/12/2023   Procedure: TOTAL KNEE ARTHROPLASTY;  Surgeon: Vickki Hearing, MD;  Location: [facility] ORS;  Service: Orthopedics;  Laterality: Right;   Patient Active Problem List   Diagnosis Date Noted   S/P total knee replacement, right 04/12/23 04/27/2023   Primary localized osteoarthritis of right knee 04/12/2023   S/P total knee replacement, left 11/11/20 11/25/2020   Primary osteoarthritis of right knee 11/11/2020   Primary osteoarthritis of left knee    Encounter for screening colonoscopy 05/09/2018   Polyphagia 05/09/2018   Complication of anesthesia 05/09/2018   Superior labrum anterior-to-posterior (SLAP) tear of right shoulder 03/29/2018   History of fusion of cervical spine 12/28/2017   Status post lumbar spinal fusion 11/15/2017   RESTLESS LEGS SYNDROME 08/02/2009   HYPERLIPIDEMIA 07/23/2009   OBSTRUCTIVE SLEEP APNEA 07/23/2009    PCP: Carylon Perches MD  REFERRING PROVIDER: Vickki Hearing, MD  REFERRING DIAG: M17.11 (ICD-10-CM) - Arthritis of right knee  THERAPY DIAG:  Difficulty in walking, not elsewhere classified  Stiffness of right knee, not  elsewhere classified  Acute pain of right knee  Primary osteoarthritis of right knee  Rationale for Evaluation and Treatment: Rehabilitation  ONSET DATE: 04/12/23  SUBJECTIVE:   SUBJECTIVE STATEMENT: Good MD visit yesterday; stitches removed; 3/10 pain today  Eval:Right knee "worn out"; surgery 04/12/23 per Dr. Romeo Apple.  Went home the next day; had home health for a little bit and then sent for outpatient therapy.  Arrives today with a SPC.  Walking with cane about a week; sees [employer] 04/27/23; waterproof bandage remains  PERTINENT HISTORY: Total knee replacement  right planned for 04/12/23 needs evaluate and treat physical therapy to start on or about Oct 8th; history of L TKA PAIN:  Are you having pain? Yes: NPRS scale: 2/10 Pain location: Right knee Pain description: throbbing, tight, dull Aggravating factors: unknown Relieving factors: medication  PRECAUTIONS: None  RED FLAGS: None   WEIGHT BEARING RESTRICTIONS: No  FALLS:  Has patient fallen in last 6 months? No  LIVING ENVIRONMENT: Lives with: lives alone Lives in: Mobile home Stairs: Yes: External: 4 steps; none Has following equipment at home: Single point cane, Walker - 2 wheeled, shower chair, and bed side commode  OCCUPATION: do little part time jobs; wash dishes at [employer]  PLOF: Independent  PATIENT GOALS: be able to walk good  NEXT MD VISIT: 04/27/23  OBJECTIVE:  Note: Objective measures were completed at Evaluation unless otherwise noted.  DIAGNOSTIC FINDINGS: CLINICAL DATA:  Status post knee arthroplasty.   EXAM: PORTABLE RIGHT KNEE - 1-2 VIEW   COMPARISON:  None Available.   FINDINGS: Right knee arthroplasty in expected alignment. No periprosthetic lucency or fracture. There has been patellar resurfacing. Recent postsurgical change includes air and edema in the soft tissues and joint space. Anterior skin staples.   IMPRESSION: Right knee arthroplasty without immediate postoperative complication.  PATIENT SURVEYS:  FOTO 58  COGNITION: Overall cognitive status: Within functional limits for tasks assessed     SENSATION: WFL  EDEMA:  Normal for this time s/p   POSTURE: No Significant postural limitations  PALPATION: Normal for this time s/p  LOWER EXTREMITY ROM:  Active ROM Right eval Left eval Right 04/28/23  Hip flexion     Hip extension     Hip abduction     Hip adduction     Hip internal rotation     Hip external rotation     Knee flexion 110 130 120  Knee extension -5 (lacking) 0 -5 (lacking)  Ankle dorsiflexion     Ankle  plantarflexion     Ankle inversion     Ankle eversion      (Blank rows = not tested)  LOWER EXTREMITY MMT:  MMT Right eval Left eval  Hip flexion 4+ 5  Hip extension    Hip abduction    Hip adduction    Hip internal rotation    Hip external rotation    Knee flexion (sitting mid range isometric) 4- 5  Knee extension 4- 5  Ankle dorsiflexion 4+ 5  Ankle plantarflexion    Ankle inversion    Ankle eversion     (Blank rows = not tested) FUNCTIONAL TESTS:  5 times sit to stand: 18.51 sec no UE assist 2 minute walk test: 319 ft with SPC  GAIT: Distance walked: 319 ft Assistive device utilized: Single point cane Level of assistance: Modified independence Comments: slight antalgic gait   TODAY'S TREATMENT:  DATE:  04/28/23 Review of HEP and goals Standing: Heel/toe raises x 10 Slant board 3 x 20" Sit to stand no UE assist x 10 TKE with green theraband x 15 Bike seat 13 x 5' for mobility; full revolutions Knee drives for flexion on 2nd step (7" step) x 2' Hamstring stretch 2nd step 5 x 20" Supine AROM -5 to 120 today Supine knee extension stretch with 3# weight x 2' Manual knee extension stretch x 5 reps Hamstring stretch with strap 20" x 3 right    04/26/23 physical therapy evaluation and HEP instruction     PATIENT EDUCATION:  Education details: Patient educated on exam findings, POC, scope of PT, HEP, and what to expect next visit. Person educated: Patient Education method: Explanation, Demonstration, and Handouts Education comprehension: verbalized understanding, returned demonstration, verbal cues required, and tactile cues required  HOME EXERCISE PROGRAM: Access Code: [email or web address] URL: [email or web address] Date: 04/26/2023 Prepared by: [facility] - [facility] 10/10 hamstring stretch with strap  Exercises - Sit to Stand  - 2 x daily  - 7 x weekly - 1 sets - 10 reps - Supine Knee Extension Stretch on Towel Roll  - 2 x daily - 7 x weekly - 1 sets - 1 reps - 5 min hold - Prone Knee Extension Hang  - 2 x daily - 7 x weekly - 1 sets - 1 reps - 5 min hold  ASSESSMENT:  CLINICAL IMPRESSION: Today's session started with a review of HEP and goals; patient verbalizes agreement with set rehab goals.  Initiated exercise in the gym today with good response.  Noted slight lean to the left with sit to stand; improves with verbal cueing.  Added TKE's today to work to improve right knee extension.  Patient able to make full forward revolutions on bike today. Patient able to walk in PT clinic without cane. Met knee flexion goal today; continues with extension lag. Added hamstring stretch to HEP.    Patient will benefit from continued skilled therapy services to address deficits and promote return to optimal function.       Eval:Patient is a 68 y.o. male who was seen today for physical therapy evaluation and treatment for M17.11 (ICD-10-CM) - Arthritis of right knee. Patient demonstrates muscle weakness, reduced ROM, and fascial restrictions which are likely contributing to symptoms of pain and are negatively impacting patient ability to perform ADLs and functional mobility tasks. Patient will benefit from skilled physical therapy services to address these deficits to reduce pain and improve level of function with ADLs and functional mobility tasks.   OBJECTIVE IMPAIRMENTS: Abnormal gait, difficulty walking, decreased ROM, decreased strength, hypomobility, increased edema, increased fascial restrictions, impaired perceived functional ability, and pain.   ACTIVITY LIMITATIONS: bending, sitting, standing, squatting, stairs, transfers, and locomotion level  PARTICIPATION LIMITATIONS: meal prep, cleaning, laundry, community activity, occupation, and yard work  [facility] POTENTIAL: Good  CLINICAL DECISION MAKING: Stable/uncomplicated  EVALUATION  COMPLEXITY: Low   GOALS: Goals reviewed with patient? No  SHORT TERM GOALS: Target date: 05/10/2023 patient will be independent with initial HEP  Baseline: Goal status: in progress  2.  Patient will self report 50% improvement to improve tolerance for functional activity  Baseline:  Goal status: in progress  LONG TERM GOALS: Target date: 05/24/2023   Patient will be independent in self management strategies to improve quality of life and functional outcomes.  Baseline:  Goal status: IN progress  2.  Patient will self report 75% improvement to improve tolerance for functional activity  Baseline:  Goal status: IN progress   3.  Patient will increase knee mobility to 0 to 120 to promote normal navigation of steps; step over step pattern  Baseline:  Goal status: IN progress   4.  Patient will increase right leg MMTs to 5/5 without pain to promote return to ambulation community distances with minimal deviation.    Baseline:  Goal status: IN progress  5.  Patient will increase distance on to 350 ft to demonstrate improved functional mobility walking household and community distances.   Baseline: 319 ft Goal status: IN progress  6.  Patient will improve FOTO score by 10  points to demonstrate improved perceived functional mobility Baseline: 55 Goal status: IN progress    PLAN:  PT FREQUENCY: 2x/week  PT DURATION: 4 weeks  PLANNED INTERVENTIONS: Therapeutic exercises, Therapeutic activity, Neuromuscular re-education, Balance training, Gait training, Patient/Family education, Joint manipulation, Joint mobilization, Stair training, Orthotic/Fit training, DME instructions, Aquatic Therapy, Dry Needling, Electrical stimulation, Spinal manipulation, Spinal mobilization, Cryotherapy, Moist heat, Compression bandaging, scar mobilization, Splintting, Taping, Traction, Ultrasound, Ionotophoresis 4mg /ml Dexamethasone, and Manual therapy   PLAN FOR NEXT SESSION:  right  knee mobility and strength; gait training and balance   9:26 AM, 04/28/23 Dawid Dupriest Small Shareeka Yim MPT [place] [place] [facility] [place] [phone] Ph:[phone]

## 2023-05-02 ENCOUNTER — Ambulatory Visit (HOSPITAL_COMMUNITY): Payer: Medicare Other

## 2023-05-02 DIAGNOSIS — R262 Difficulty in walking, not elsewhere classified: Secondary | ICD-10-CM | POA: Diagnosis not present

## 2023-05-02 DIAGNOSIS — M25661 Stiffness of right knee, not elsewhere classified: Secondary | ICD-10-CM

## 2023-05-02 DIAGNOSIS — M25561 Pain in right knee: Secondary | ICD-10-CM | POA: Diagnosis not present

## 2023-05-02 DIAGNOSIS — M1711 Unilateral primary osteoarthritis, right knee: Secondary | ICD-10-CM | POA: Diagnosis not present

## 2023-05-02 NOTE — Therapy (Signed)
OUTPATIENT PHYSICAL THERAPY LOWER EXTREMITY TREATMENT Patient Name: [name] MRN: [id number] DOB:23-Mar-1956, 67 y.o., male Today's Date: 05/02/2023  END OF SESSION:  PT End of Session - 05/02/23 0939     Visit Number 3    Number of Visits 8    Date for PT Re-Evaluation 05/24/23    Authorization Type [employer]    Authorization Time Period auth submitted 10/14 please check    Progress Note Due on Visit 8    PT Start Time 0932    PT Stop Time 1015    PT Time Calculation (min) 43 min    Activity Tolerance Patient tolerated treatment well    Behavior During Therapy [facility] for tasks assessed/performed              Past Medical History:  Diagnosis Date   Anxiety    some panic attacks- off medications   Arthritis    Complication of anesthesia    woke up during surgery- Elbow surgery, 2009 and again with foot surgeryry    Depression    GERD (gastroesophageal reflux disease)    once in awhile   Hypercholesteremia    Sleep apnea    has lost 'abunch of weight' its been yrs since i used it   Past Surgical History:  Procedure Laterality Date   ANTERIOR CERVICAL DECOMP/DISCECTOMY FUSION N/A 12/21/2017   Procedure: C3-4 ANTERIOR CERVICAL DECOMPRESSION/DISCECTOMY FUSION, ALLOGRAFT, PLATE;  Surgeon: Eldred Manges, MD;  Location: [facility] OR;  Service: Orthopedics;  Laterality: N/A;   CATARACT EXTRACTION Right    CATARACT EXTRACTION Left    COLONOSCOPY  2008   Dr. Darrick Penna: Hyperplastic polyp removed, diverticulosis.   COLONOSCOPY WITH PROPOFOL N/A 07/11/2018   Procedure: COLONOSCOPY WITH PROPOFOL;  Surgeon: West Bali, MD;  Location: [facility];  Service: Endoscopy;  Laterality: N/A;  7:30am   FOOT SURGERY     left elbow      LUMBAR LAMINECTOMY     back in 2015   MASS EXCISION Left 09/12/2019   Procedure: EXCISION GOUTY TOPHI;  Surgeon: Ferman Hamming, DPM;  Location: [facility] ORS;  Service: Podiatry;  Laterality: Left;   POLYPECTOMY  07/11/2018   Procedure: POLYPECTOMY;   Surgeon: West Bali, MD;  Location: [facility];  Service: Endoscopy;;  colon   TOTAL KNEE ARTHROPLASTY Left 11/11/2020   Procedure: LEFT TOTAL KNEE ARTHROPLASTY;  Surgeon: Vickki Hearing, MD;  Location: [facility] ORS;  Service: Orthopedics;  Laterality: Left;   TOTAL KNEE ARTHROPLASTY Right 04/12/2023   Procedure: TOTAL KNEE ARTHROPLASTY;  Surgeon: Vickki Hearing, MD;  Location: [facility] ORS;  Service: Orthopedics;  Laterality: Right;   Patient Active Problem List   Diagnosis Date Noted   S/P total knee replacement, right 04/12/23 04/27/2023   Primary localized osteoarthritis of right knee 04/12/2023   S/P total knee replacement, left 11/11/20 11/25/2020   Primary osteoarthritis of right knee 11/11/2020   Primary osteoarthritis of left knee    Encounter for screening colonoscopy 05/09/2018   Polyphagia 05/09/2018   Complication of anesthesia 05/09/2018   Superior labrum anterior-to-posterior (SLAP) tear of right shoulder 03/29/2018   History of fusion of cervical spine 12/28/2017   Status post lumbar spinal fusion 11/15/2017   RESTLESS LEGS SYNDROME 08/02/2009   HYPERLIPIDEMIA 07/23/2009   OBSTRUCTIVE SLEEP APNEA 07/23/2009    PCP: Carylon Perches MD  REFERRING PROVIDER: Vickki Hearing, MD  REFERRING DIAG: M17.11 (ICD-10-CM) - Arthritis of right knee  THERAPY DIAG:  Difficulty in walking, not  elsewhere classified  Stiffness of right knee, not elsewhere classified  Acute pain of right knee  Primary osteoarthritis of right knee  Rationale for Evaluation and Treatment: Rehabilitation  ONSET DATE: 04/12/23  SUBJECTIVE:   SUBJECTIVE STATEMENT: 3/10 soreness right knee today; did ok over the weekend; no questions regarding exercises.  Eval:Right knee "worn out"; surgery 04/12/23 per Dr. Romeo Apple.  Went home the next day; had home health for a little bit and then sent for outpatient therapy.  Arrives today with a SPC.  Walking with cane about a week; sees [employer]  04/27/23; waterproof bandage remains  PERTINENT HISTORY: Total knee replacement right planned for 04/12/23 needs evaluate and treat physical therapy to start on or about Oct 8th; history of L TKA PAIN:  Are you having pain? Yes: NPRS scale: 2/10 Pain location: Right knee Pain description: throbbing, tight, dull Aggravating factors: unknown Relieving factors: medication  PRECAUTIONS: None  RED FLAGS: None   WEIGHT BEARING RESTRICTIONS: No  FALLS:  Has patient fallen in last 6 months? No  LIVING ENVIRONMENT: Lives with: lives alone Lives in: Mobile home Stairs: Yes: External: 4 steps; none Has following equipment at home: Single point cane, Walker - 2 wheeled, shower chair, and bed side commode  OCCUPATION: do little part time jobs; wash dishes at [employer]  PLOF: Independent  PATIENT GOALS: be able to walk good  NEXT MD VISIT: 04/27/23  OBJECTIVE:  Note: Objective measures were completed at Evaluation unless otherwise noted.  DIAGNOSTIC FINDINGS: CLINICAL DATA:  Status post knee arthroplasty.   EXAM: PORTABLE RIGHT KNEE - 1-2 VIEW   COMPARISON:  None Available.   FINDINGS: Right knee arthroplasty in expected alignment. No periprosthetic lucency or fracture. There has been patellar resurfacing. Recent postsurgical change includes air and edema in the soft tissues and joint space. Anterior skin staples.   IMPRESSION: Right knee arthroplasty without immediate postoperative complication.  PATIENT SURVEYS:  FOTO 58  COGNITION: Overall cognitive status: Within functional limits for tasks assessed     SENSATION: WFL  EDEMA:  Normal for this time s/p   POSTURE: No Significant postural limitations  PALPATION: Normal for this time s/p  LOWER EXTREMITY ROM:  Active ROM Right eval Left eval Right 04/28/23  Hip flexion     Hip extension     Hip abduction     Hip adduction     Hip internal rotation     Hip external rotation     Knee flexion 110  130 120  Knee extension -5 (lacking) 0 -5 (lacking)  Ankle dorsiflexion     Ankle plantarflexion     Ankle inversion     Ankle eversion      (Blank rows = not tested)  LOWER EXTREMITY MMT:  MMT Right eval Left eval  Hip flexion 4+ 5  Hip extension    Hip abduction    Hip adduction    Hip internal rotation    Hip external rotation    Knee flexion (sitting mid range isometric) 4- 5  Knee extension 4- 5  Ankle dorsiflexion 4+ 5  Ankle plantarflexion    Ankle inversion    Ankle eversion     (Blank rows = not tested) FUNCTIONAL TESTS:  5 times sit to stand: 18.51 sec no UE assist 2 minute walk test: 319 ft with SPC  GAIT: Distance walked: 319 ft Assistive device utilized: Single point cane Level of assistance: Modified independence Comments: slight antalgic gait   TODAY'S TREATMENT:  DATE:  05/02/23 Bike seat 13 x 5' for mobility; full revolutions Standing: Heel/toe raises x 20 Slant board 5 x 20" Bodycraft 3 plates TKE x 30 Tandem stance 2 x 30" each SLS 2 x 10"  Sit to stand 2 x 10 no UE assist  Seated: Hamstring stretch with strap 5 x 20" Supine: Manual knee extension x 5  reps Knee extension AROM -5; AAROM 0  04/28/23 Review of HEP and goals Standing: Heel/toe raises x 10 Slant board 3 x 20" Sit to stand no UE assist x 10 TKE with green theraband x 15 Bike seat 13 x 5' for mobility; full revolutions Knee drives for flexion on 2nd step (7" step) x 2' Hamstring stretch 2nd step 5 x 20" Supine AROM -5 to 120 today Supine knee extension stretch with 3# weight x 2' Manual knee extension stretch x 5 reps Hamstring stretch with strap 20" x 3 right    04/26/23 physical therapy evaluation and HEP instruction     PATIENT EDUCATION:  Education details: Patient educated on exam findings, POC, scope of PT, HEP, and what to expect  next visit. Person educated: Patient Education method: Explanation, Demonstration, and Handouts Education comprehension: verbalized understanding, returned demonstration, verbal cues required, and tactile cues required  HOME EXERCISE PROGRAM: Access Code: [email or web address] URL: [email or web address] Date: 04/26/2023 Prepared by: [facility] 10/10 hamstring stretch with strap  Exercises - Sit to Stand  - 2 x daily - 7 x weekly - 1 sets - 10 reps - Supine Knee Extension Stretch on Towel Roll  - 2 x daily - 7 x weekly - 1 sets - 1 reps - 5 min hold - Prone Knee Extension Hang  - 2 x daily - 7 x weekly - 1 sets - 1 reps - 5 min hold  ASSESSMENT:  CLINICAL IMPRESSION: Started off with bike; able to make full revolution first attempt. Able to perform all sit to stands without any upper extremity assist.  Added bodycraft TKE today without issue.   Added tandem stance and SLS for balance challenge today.  Still lacking full knee extension so continued with manual knee extension.   Patient will benefit from continued skilled therapy services to address deficits and promote return to optimal function.       Eval:Patient is a 67 y.o. male who was seen today for physical therapy evaluation and treatment for M17.11 (ICD-10-CM) - Arthritis of right knee. Patient demonstrates muscle weakness, reduced ROM, and fascial restrictions which are likely contributing to symptoms of pain and are negatively impacting patient ability to perform ADLs and functional mobility tasks. Patient will benefit from skilled physical therapy services to address these deficits to reduce pain and improve level of function with ADLs and functional mobility tasks.   OBJECTIVE IMPAIRMENTS: Abnormal gait, difficulty walking, decreased ROM, decreased strength, hypomobility, increased edema, increased fascial restrictions, impaired perceived functional ability, and pain.   ACTIVITY LIMITATIONS: bending, sitting, standing,  squatting, stairs, transfers, and locomotion level  PARTICIPATION LIMITATIONS: meal prep, cleaning, laundry, community activity, occupation, and yard work  [facility] POTENTIAL: Good  CLINICAL DECISION MAKING: Stable/uncomplicated  EVALUATION COMPLEXITY: Low   GOALS: Goals reviewed with patient? No  SHORT TERM GOALS: Target date: 05/10/2023 patient will be independent with initial HEP  Baseline: Goal status: in progress  2.  Patient will self report 50% improvement to improve tolerance for functional activity  Baseline:  Goal status: in progress  LONG TERM GOALS: Target date: 05/24/2023   Patient will be independent  in self management strategies to improve quality of life and functional outcomes.  Baseline:  Goal status: IN progress  2.  Patient will self report 75% improvement to improve tolerance for functional activity  Baseline:  Goal status: IN progress   3.  Patient will increase knee mobility to 0 to 120 to promote normal navigation of steps; step over step pattern  Baseline:  Goal status: IN progress   4.  Patient will increase right leg MMTs to 5/5 without pain to promote return to ambulation community distances with minimal deviation.    Baseline:  Goal status: IN progress  5.  Patient will increase distance on to 350 ft to demonstrate improved functional mobility walking household and community distances.   Baseline: 319 ft Goal status: IN progress  6.  Patient will improve FOTO score by 10  points to demonstrate improved perceived functional mobility Baseline: 55 Goal status: IN progress    PLAN:  PT FREQUENCY: 2x/week  PT DURATION: 4 weeks  PLANNED INTERVENTIONS: Therapeutic exercises, Therapeutic activity, Neuromuscular re-education, Balance training, Gait training, Patient/Family education, Joint manipulation, Joint mobilization, Stair training, Orthotic/Fit training, DME instructions, Aquatic Therapy, Dry Needling, Electrical  stimulation, Spinal manipulation, Spinal mobilization, Cryotherapy, Moist heat, Compression bandaging, scar mobilization, Splintting, Taping, Traction, Ultrasound, Ionotophoresis 4mg /ml Dexamethasone, and Manual therapy   PLAN FOR NEXT SESSION:  right knee mobility and strength; gait training and balance   9:57 AM, 05/02/23 Savaughn Karwowski Small Salome Hautala MPT [facility] [place] [phone] Ph:[phone]

## 2023-05-03 ENCOUNTER — Telehealth: Payer: Self-pay

## 2023-05-03 NOTE — Telephone Encounter (Signed)
Thanks, I have called him to advise. Ok to drive/ not on Hydrocodone. He voiced understanding.

## 2023-05-03 NOTE — Telephone Encounter (Signed)
Yes for him has quad control

## 2023-05-03 NOTE — Telephone Encounter (Signed)
Patient left voicemail message wanting to know if he can start back driving now, stated it has been 3 weeks out since his surgery.(04/12/23)  Please call and advise [phone]

## 2023-05-04 ENCOUNTER — Encounter (HOSPITAL_COMMUNITY): Payer: Self-pay

## 2023-05-04 ENCOUNTER — Ambulatory Visit (HOSPITAL_COMMUNITY): Payer: Medicare Other

## 2023-05-04 DIAGNOSIS — M25561 Pain in right knee: Secondary | ICD-10-CM | POA: Diagnosis not present

## 2023-05-04 DIAGNOSIS — M1711 Unilateral primary osteoarthritis, right knee: Secondary | ICD-10-CM | POA: Diagnosis not present

## 2023-05-04 DIAGNOSIS — R262 Difficulty in walking, not elsewhere classified: Secondary | ICD-10-CM

## 2023-05-04 DIAGNOSIS — M25661 Stiffness of right knee, not elsewhere classified: Secondary | ICD-10-CM | POA: Diagnosis not present

## 2023-05-04 NOTE — Therapy (Signed)
OUTPATIENT PHYSICAL THERAPY LOWER EXTREMITY TREATMENT Patient Name: [name] MRN: [id number] DOB:22-Dec-1955, 67 y.o., male Today's Date: 05/04/2023  END OF SESSION:  PT End of Session - 05/04/23 0929     Visit Number 4    Number of Visits 8    Date for PT Re-Evaluation 05/24/23    Authorization Type [employer]    Authorization Time Period still pending auth    Progress Note Due on Visit 8    PT Start Time 0843    PT Stop Time 0926    PT Time Calculation (min) 43 min    Equipment Utilized During Treatment Gait belt    Activity Tolerance Patient tolerated treatment well    Behavior During Therapy WFL for tasks assessed/performed               Past Medical History:  Diagnosis Date   Anxiety    some panic attacks- off medications   Arthritis    Complication of anesthesia    woke up during surgery- Elbow surgery, 2009 and again with foot surgeryry    Depression    GERD (gastroesophageal reflux disease)    once in awhile   Hypercholesteremia    Sleep apnea    has lost 'abunch of weight' its been yrs since i used it   Past Surgical History:  Procedure Laterality Date   ANTERIOR CERVICAL DECOMP/DISCECTOMY FUSION N/A 12/21/2017   Procedure: C3-4 ANTERIOR CERVICAL DECOMPRESSION/DISCECTOMY FUSION, ALLOGRAFT, PLATE;  Surgeon: Eldred Manges, MD;  Location: [facility];  Service: Orthopedics;  Laterality: N/A;   CATARACT EXTRACTION Right    CATARACT EXTRACTION Left    COLONOSCOPY  2008   Dr. Darrick Penna: Hyperplastic polyp removed, diverticulosis.   COLONOSCOPY WITH PROPOFOL N/A 07/11/2018   Procedure: COLONOSCOPY WITH PROPOFOL;  Surgeon: West Bali, MD;  Location: [facility];  Service: Endoscopy;  Laterality: N/A;  7:30am   FOOT SURGERY     left elbow      LUMBAR LAMINECTOMY     back in 2015   MASS EXCISION Left 09/12/2019   Procedure: EXCISION GOUTY TOPHI;  Surgeon: Ferman Hamming, DPM;  Location: [facility];  Service: Podiatry;  Laterality: Left;   POLYPECTOMY   07/11/2018   Procedure: POLYPECTOMY;  Surgeon: West Bali, MD;  Location: [facility];  Service: Endoscopy;;  colon   TOTAL KNEE ARTHROPLASTY Left 11/11/2020   Procedure: LEFT TOTAL KNEE ARTHROPLASTY;  Surgeon: Vickki Hearing, MD;  Location: [facility];  Service: Orthopedics;  Laterality: Left;   TOTAL KNEE ARTHROPLASTY Right 04/12/2023   Procedure: TOTAL KNEE ARTHROPLASTY;  Surgeon: Vickki Hearing, MD;  Location: [facility];  Service: Orthopedics;  Laterality: Right;   Patient Active Problem List   Diagnosis Date Noted   S/P total knee replacement, right 04/12/23 04/27/2023   Primary localized osteoarthritis of right knee 04/12/2023   S/P total knee replacement, left 11/11/20 11/25/2020   Primary osteoarthritis of right knee 11/11/2020   Primary osteoarthritis of left knee    Encounter for screening colonoscopy 05/09/2018   Polyphagia 05/09/2018   Complication of anesthesia 05/09/2018   Superior labrum anterior-to-posterior (SLAP) tear of right shoulder 03/29/2018   History of fusion of cervical spine 12/28/2017   Status post lumbar spinal fusion 11/15/2017   RESTLESS LEGS SYNDROME 08/02/2009   HYPERLIPIDEMIA 07/23/2009   OBSTRUCTIVE SLEEP APNEA 07/23/2009    PCP: Carylon Perches MD  REFERRING PROVIDER: Vickki Hearing, MD  REFERRING DIAG: M17.11 (ICD-10-CM) - Arthritis of right knee  THERAPY DIAG:  Difficulty in walking, not elsewhere classified  Stiffness of right knee, not elsewhere classified  Rationale for Evaluation and Treatment: Rehabilitation  ONSET DATE: 04/12/23  SUBJECTIVE:   SUBJECTIVE STATEMENT: 4/10 pain today with some stiffness. No issues with HEP so far.  Eval:Right knee "worn out"; surgery 04/12/23 per Dr. Romeo Apple.  Went home the next day; had home health for a little bit and then sent for outpatient therapy.  Arrives today with a SPC.  Walking with cane about a week; sees [employer] 04/27/23; waterproof bandage remains  PERTINENT  HISTORY: Total knee replacement right planned for 04/12/23 needs evaluate and treat physical therapy to start on or about Oct 8th; history of L TKA PAIN:  Are you having pain? Yes: NPRS scale: 2/10 Pain location: Right knee Pain description: throbbing, tight, dull Aggravating factors: unknown Relieving factors: medication  PRECAUTIONS: None  RED FLAGS: None   WEIGHT BEARING RESTRICTIONS: No  FALLS:  Has patient fallen in last 6 months? No  LIVING ENVIRONMENT: Lives with: lives alone Lives in: Mobile home Stairs: Yes: External: 4 steps; none Has following equipment at home: Single point cane, Walker - 2 wheeled, shower chair, and bed side commode  OCCUPATION: do little part time jobs; wash dishes at [employer]  PLOF: Independent  PATIENT GOALS: be able to walk good  NEXT MD VISIT: 04/27/23  OBJECTIVE:  Note: Objective measures were completed at Evaluation unless otherwise noted.  DIAGNOSTIC FINDINGS: CLINICAL DATA:  Status post knee arthroplasty.   EXAM: PORTABLE RIGHT KNEE - 1-2 VIEW   COMPARISON:  None Available.   FINDINGS: Right knee arthroplasty in expected alignment. No periprosthetic lucency or fracture. There has been patellar resurfacing. Recent postsurgical change includes air and edema in the soft tissues and joint space. Anterior skin staples.   IMPRESSION: Right knee arthroplasty without immediate postoperative complication.  PATIENT SURVEYS:  FOTO 58  COGNITION: Overall cognitive status: Within functional limits for tasks assessed     SENSATION: WFL  EDEMA:  Normal for this time s/p   POSTURE: No Significant postural limitations  PALPATION: Normal for this time s/p  LOWER EXTREMITY ROM:  Active ROM Right eval Left eval Right 04/28/23  Hip flexion     Hip extension     Hip abduction     Hip adduction     Hip internal rotation     Hip external rotation     Knee flexion 110 130 120  Knee extension -5 (lacking) 0 -5  (lacking)  Ankle dorsiflexion     Ankle plantarflexion     Ankle inversion     Ankle eversion      (Blank rows = not tested)  LOWER EXTREMITY MMT:  MMT Right eval Left eval  Hip flexion 4+ 5  Hip extension    Hip abduction    Hip adduction    Hip internal rotation    Hip external rotation    Knee flexion (sitting mid range isometric) 4- 5  Knee extension 4- 5  Ankle dorsiflexion 4+ 5  Ankle plantarflexion    Ankle inversion    Ankle eversion     (Blank rows = not tested) FUNCTIONAL TESTS:  5 times sit to stand: 18.51 sec no UE assist 2 minute walk test: 319 ft with SPC  GAIT: Distance walked: 319 ft Assistive device utilized: Single point cane Level of assistance: Modified independence Comments: slight antalgic gait   TODAY'S TREATMENT:  DATE:  05/04/2023  -Recumbent bike seat 12 x 5' full revolutions.  -Standing calf stretch 3 x 1' -3x43ft backwards walking with TKE cues -Seated HS stretch on 6in box 2x1' -Bodycraft backwards walking #3 plate x 5 -STS 2 x 10 w/ yellow weight ball. LLE slightly anterior.  -Tandem balance on aeromat 1x1' -96ft x 2 tandem walking @ CGA -TKE bodycraft #4 plate x 20.  -Manual knee extension stetch with overpressure to 0 AAROM 3 x 30''   05/02/23 Bike seat 13 x 5' for mobility; full revolutions Standing: Heel/toe raises x 20 Slant board 5 x 20" Bodycraft 3 plates TKE x 30 Tandem stance 2 x 30" each SLS 2 x 10"  Sit to stand 2 x 10 no UE assist  Seated: Hamstring stretch with strap 5 x 20" Supine: Manual knee extension x 5  reps Knee extension AROM -5; AAROM 0  04/28/23 Review of HEP and goals Standing: Heel/toe raises x 10 Slant board 3 x 20" Sit to stand no UE assist x 10 TKE with green theraband x 15 Bike seat 13 x 5' for mobility; full revolutions Knee drives for flexion on 2nd step (7"  step) x 2' Hamstring stretch 2nd step 5 x 20" Supine AROM -5 to 120 today Supine knee extension stretch with 3# weight x 2' Manual knee extension stretch x 5 reps Hamstring stretch with strap 20" x 3 right    04/26/23 physical therapy evaluation and HEP instruction     PATIENT EDUCATION:  Education details: Patient educated on exam findings, POC, scope of PT, HEP, and what to expect next visit. Person educated: Patient Education method: Explanation, Demonstration, and Handouts Education comprehension: verbalized understanding, returned demonstration, verbal cues required, and tactile cues required  HOME EXERCISE PROGRAM: Access Code: [email or web address] URL: [email or web address] Date: 04/26/2023 Prepared by: [facility] 10/10 hamstring stretch with strap  Exercises - Sit to Stand  - 2 x daily - 7 x weekly - 1 sets - 10 reps - Supine Knee Extension Stretch on Towel Roll  - 2 x daily - 7 x weekly - 1 sets - 1 reps - 5 min hold - Prone Knee Extension Hang  - 2 x daily - 7 x weekly - 1 sets - 1 reps - 5 min hold  ASSESSMENT:  CLINICAL IMPRESSION:  Pt tolerating session well. Showing great progression with knee mobility and safety with dynamic, narrow base ambulation like activities. Pt showing mild ER of foot during ambulation and functional activities. Advised to emphasize calf stretching during HEP. Believe that calf mobility leading cause for end-range restriction. Pt will be ready for DC in the next few session. Pt will continue to benefit from skilled PT services to address end range ROM restrictions and improve balance during dynamic activities..       Eval:Patient is a 67 y.o. male who was seen today for physical therapy evaluation and treatment for M17.11 (ICD-10-CM) - Arthritis of right knee. Patient demonstrates muscle weakness, reduced ROM, and fascial restrictions which are likely contributing to symptoms of pain and are negatively impacting patient ability to perform  ADLs and functional mobility tasks. Patient will benefit from skilled physical therapy services to address these deficits to reduce pain and improve level of function with ADLs and functional mobility tasks.   OBJECTIVE IMPAIRMENTS: Abnormal gait, difficulty walking, decreased ROM, decreased strength, hypomobility, increased edema, increased fascial restrictions, impaired perceived functional ability, and pain.   ACTIVITY LIMITATIONS: bending, sitting, standing, squatting, stairs, transfers, and locomotion level  PARTICIPATION LIMITATIONS: meal prep, cleaning, laundry, community activity, occupation, and yard work  [facility] POTENTIAL: Good  CLINICAL DECISION MAKING: Stable/uncomplicated  EVALUATION COMPLEXITY: Low   GOALS: Goals reviewed with patient? No  SHORT TERM GOALS: Target date: 05/10/2023 patient will be independent with initial HEP  Baseline: Goal status: in progress  2.  Patient will self report 50% improvement to improve tolerance for functional activity  Baseline:  Goal status: in progress  LONG TERM GOALS: Target date: 05/24/2023   Patient will be independent in self management strategies to improve quality of life and functional outcomes.  Baseline:  Goal status: IN progress  2.  Patient will self report 75% improvement to improve tolerance for functional activity  Baseline:  Goal status: IN progress   3.  Patient will increase knee mobility to 0 to 120 to promote normal navigation of steps; step over step pattern  Baseline:  Goal status: IN progress   4.  Patient will increase right leg MMTs to 5/5 without pain to promote return to ambulation community distances with minimal deviation.    Baseline:  Goal status: IN progress  5.  Patient will increase distance on to 350 ft to demonstrate improved functional mobility walking household and community distances.   Baseline: 319 ft Goal status: IN progress  6.  Patient will improve FOTO score by 10   points to demonstrate improved perceived functional mobility Baseline: 55 Goal status: IN progress    PLAN:  PT FREQUENCY: 2x/week  PT DURATION: 4 weeks  PLANNED INTERVENTIONS: Therapeutic exercises, Therapeutic activity, Neuromuscular re-education, Balance training, Gait training, Patient/Family education, Joint manipulation, Joint mobilization, Stair training, Orthotic/Fit training, DME instructions, Aquatic Therapy, Dry Needling, Electrical stimulation, Spinal manipulation, Spinal mobilization, Cryotherapy, Moist heat, Compression bandaging, scar mobilization, Splintting, Taping, Traction, Ultrasound, Ionotophoresis 4mg /ml Dexamethasone, and Manual therapy   PLAN FOR NEXT SESSION:  right knee mobility and strength; gait training and balance   9:31 AM, 05/04/23 Nelida Meuse PT, DPT Physical Therapist with Tomasa Hosteller [facility] [phone] office

## 2023-05-09 ENCOUNTER — Ambulatory Visit (HOSPITAL_COMMUNITY): Payer: Medicare Other | Admitting: Physical Therapy

## 2023-05-09 DIAGNOSIS — M25561 Pain in right knee: Secondary | ICD-10-CM

## 2023-05-09 DIAGNOSIS — M1711 Unilateral primary osteoarthritis, right knee: Secondary | ICD-10-CM | POA: Diagnosis not present

## 2023-05-09 DIAGNOSIS — M25661 Stiffness of right knee, not elsewhere classified: Secondary | ICD-10-CM | POA: Diagnosis not present

## 2023-05-09 DIAGNOSIS — R262 Difficulty in walking, not elsewhere classified: Secondary | ICD-10-CM | POA: Diagnosis not present

## 2023-05-09 NOTE — Therapy (Signed)
OUTPATIENT PHYSICAL THERAPY LOWER EXTREMITY TREATMENT  Patient Name: [name] MRN: [id number] DOB:July 23, 1955, 67 y.o., male Today's Date: 05/09/2023  END OF SESSION:  PT End of Session - 05/09/23 1119     Visit Number 5    Number of Visits 8    Date for PT Re-Evaluation 05/24/23    Authorization Type [employer] Medicare    Authorization Time Period still pending auth    Progress Note Due on Visit 8    PT Start Time 0933    PT Stop Time 1012    PT Time Calculation (min) 39 min    Equipment Utilized During Treatment Gait belt    Activity Tolerance Patient tolerated treatment well    Behavior During Therapy WFL for tasks assessed/performed                Past Medical History:  Diagnosis Date   Anxiety    some panic attacks- off medications   Arthritis    Complication of anesthesia    woke up during surgery- Elbow surgery, 2009 and again with foot surgeryry    Depression    GERD (gastroesophageal reflux disease)    once in awhile   Hypercholesteremia    Sleep apnea    has lost 'abunch of weight' its been yrs since i used it   Past Surgical History:  Procedure Laterality Date   ANTERIOR CERVICAL DECOMP/DISCECTOMY FUSION N/A 12/21/2017   Procedure: C3-4 ANTERIOR CERVICAL DECOMPRESSION/DISCECTOMY FUSION, ALLOGRAFT, PLATE;  Surgeon: Eldred Manges, MD;  Location: [facility];  Service: Orthopedics;  Laterality: N/A;   CATARACT EXTRACTION Right    CATARACT EXTRACTION Left    COLONOSCOPY  2008   Dr. Darrick Penna: Hyperplastic polyp removed, diverticulosis.   COLONOSCOPY WITH PROPOFOL N/A 07/11/2018   Procedure: COLONOSCOPY WITH PROPOFOL;  Surgeon: West Bali, MD;  Location: [facility];  Service: Endoscopy;  Laterality: N/A;  7:30am   FOOT SURGERY     left elbow      LUMBAR LAMINECTOMY     back in 2015   MASS EXCISION Left 09/12/2019   Procedure: EXCISION GOUTY TOPHI;  Surgeon: Ferman Hamming, DPM;  Location: [facility];  Service: Podiatry;  Laterality: Left;   POLYPECTOMY   07/11/2018   Procedure: POLYPECTOMY;  Surgeon: West Bali, MD;  Location: [facility];  Service: Endoscopy;;  colon   TOTAL KNEE ARTHROPLASTY Left 11/11/2020   Procedure: LEFT TOTAL KNEE ARTHROPLASTY;  Surgeon: Vickki Hearing, MD;  Location: [facility];  Service: Orthopedics;  Laterality: Left;   TOTAL KNEE ARTHROPLASTY Right 04/12/2023   Procedure: TOTAL KNEE ARTHROPLASTY;  Surgeon: Vickki Hearing, MD;  Location: [facility];  Service: Orthopedics;  Laterality: Right;   Patient Active Problem List   Diagnosis Date Noted   S/P total knee replacement, right 04/12/23 04/27/2023   Primary localized osteoarthritis of right knee 04/12/2023   S/P total knee replacement, left 11/11/20 11/25/2020   Primary osteoarthritis of right knee 11/11/2020   Primary osteoarthritis of left knee    Encounter for screening colonoscopy 05/09/2018   Polyphagia 05/09/2018   Complication of anesthesia 05/09/2018   Superior labrum anterior-to-posterior (SLAP) tear of right shoulder 03/29/2018   History of fusion of cervical spine 12/28/2017   Status post lumbar spinal fusion 11/15/2017   RESTLESS LEGS SYNDROME 08/02/2009   HYPERLIPIDEMIA 07/23/2009   OBSTRUCTIVE SLEEP APNEA 07/23/2009    PCP: Carylon Perches MD  REFERRING PROVIDER: Vickki Hearing, MD  REFERRING DIAG: M17.11 (ICD-10-CM) - Arthritis of  right knee  THERAPY DIAG:  Difficulty in walking, not elsewhere classified  Acute pain of right knee  Primary osteoarthritis of right knee  Stiffness of right knee, not elsewhere classified  Rationale for Evaluation and Treatment: Rehabilitation  ONSET DATE: 04/12/23  SUBJECTIVE:   SUBJECTIVE STATEMENT: Pt reports tightness in his knee but no pain.  Feels he's 95% improved.  No longer taking pain meds, only using SPC in community but mostly no AD. Returns to [place] on 11/7.  States he feels like he could be done with therapy.  Eval:Right knee "worn out"; surgery 04/12/23 per Dr. Romeo Apple.  Went home  the next day; had home health for a little bit and then sent for outpatient therapy.  Arrives today with a SPC.  Walking with cane about a week; sees [employer] 04/27/23; waterproof bandage remains  PERTINENT HISTORY: Total knee replacement right planned for 04/12/23 needs evaluate and treat physical therapy to start on or about Oct 8th; history of L TKA PAIN:  Are you having pain? Yes: NPRS scale: 0/10 Pain location: Right knee Pain description: throbbing, tight, dull Aggravating factors: unknown Relieving factors: medication  PRECAUTIONS: None  RED FLAGS: None   WEIGHT BEARING RESTRICTIONS: No  FALLS:  Has patient fallen in last 6 months? No  LIVING ENVIRONMENT: Lives with: lives alone Lives in: Mobile home Stairs: Yes: External: 4 steps; none Has following equipment at home: Single point cane, Walker - 2 wheeled, shower chair, and bed side commode  OCCUPATION: do little part time jobs; wash dishes at [employer]  PLOF: Independent  PATIENT GOALS: be able to walk good  NEXT MD VISIT: 04/27/23  OBJECTIVE:  Note: Objective measures were completed at Evaluation unless otherwise noted.  DIAGNOSTIC FINDINGS: CLINICAL DATA:  Status post knee arthroplasty.   EXAM: PORTABLE RIGHT KNEE - 1-2 VIEW   COMPARISON:  None Available.   FINDINGS: Right knee arthroplasty in expected alignment. No periprosthetic lucency or fracture. There has been patellar resurfacing. Recent postsurgical change includes air and edema in the soft tissues and joint space. Anterior skin staples.   IMPRESSION: Right knee arthroplasty without immediate postoperative complication.  PATIENT SURVEYS:  FOTO 58  COGNITION: Overall cognitive status: Within functional limits for tasks assessed     SENSATION: WFL  EDEMA:  Normal for this time s/p   POSTURE: No Significant postural limitations  PALPATION: Normal for this time s/p  LOWER EXTREMITY ROM:  Active ROM Right eval  Left eval Right 04/28/23 Right 05/09/23  Hip flexion      Hip extension      Hip abduction      Hip adduction      Hip internal rotation      Hip external rotation      Knee flexion 110 130 120 130  Knee extension -5 (lacking) 0 -5 (lacking) 2  Ankle dorsiflexion      Ankle plantarflexion      Ankle inversion      Ankle eversion       (Blank rows = not tested)  LOWER EXTREMITY MMT:  MMT Right eval Left eval Right 05/09/23  Hip flexion 4+ 5 5  Hip extension     Hip abduction     Hip adduction     Hip internal rotation     Hip external rotation     Knee flexion (sitting mid range isometric) 4- 5 5  Knee extension 4- 5 5  Ankle dorsiflexion 4+ 5 5  Ankle plantarflexion  Ankle inversion     Ankle eversion      (Blank rows = not tested) FUNCTIONAL TESTS:  5 times sit to stand: 18.51 sec no UE assist 2 minute walk test: 319 ft with [facility] 05/09/23:  2 MWT  500 feet no AD (was 319 with SPC)  GAIT: Distance walked: 319 ft Assistive device utilized: Single point cane Level of assistance: Modified independence Comments: slight antalgic gait   TODAY'S TREATMENT:                                                                                                                              DATE:  05/09/23 Recumbent bike seat 12 full revolutions 5 minutes Standing:  hamstring stretch 3X30"  Slantboard stretch 3X30" FOTO 76% MMT see above AROM 2-130 Lt knee 2 MWT  500 feet no AD (was 319 with SPC)  05/04/2023  -Recumbent bike seat 12 x 5' full revolutions.  -Standing calf stretch 3 x 1' -3x25ft backwards walking with TKE cues -Seated HS stretch on 6in box 2x1' -Bodycraft backwards walking #3 plate x 5 -STS 2 x 10 w/ yellow weight ball. LLE slightly anterior.  -Tandem balance on aeromat 1x1' -36ft x 2 tandem walking @ CGA -TKE bodycraft #4 plate x 20.  -Manual knee extension stetch with overpressure to 0 AAROM 3 x 30''   05/02/23 Bike seat 13 x 5' for mobility;  full revolutions Standing: Heel/toe raises x 20 Slant board 5 x 20" Bodycraft 3 plates TKE x 30 Tandem stance 2 x 30" each SLS 2 x 10"  Sit to stand 2 x 10 no UE assist  Seated: Hamstring stretch with strap 5 x 20" Supine: Manual knee extension x 5  reps Knee extension AROM -5; AAROM 0  04/28/23 Review of HEP and goals Standing: Heel/toe raises x 10 Slant board 3 x 20" Sit to stand no UE assist x 10 TKE with green theraband x 15 Bike seat 13 x 5' for mobility; full revolutions Knee drives for flexion on 2nd step (7" step) x 2' Hamstring stretch 2nd step 5 x 20" Supine AROM -5 to 120 today Supine knee extension stretch with 3# weight x 2' Manual knee extension stretch x 5 reps Hamstring stretch with strap 20" x 3 right    04/26/23 physical therapy evaluation and HEP instruction     PATIENT EDUCATION:  Education details: Patient educated on exam findings, POC, scope of PT, HEP, and what to expect next visit. Person educated: Patient Education method: Explanation, Demonstration, and Handouts Education comprehension: verbalized understanding, returned demonstration, verbal cues required, and tactile cues required  HOME EXERCISE PROGRAM: Access Code: [email or web address] URL: [email or web address] Date: 04/26/2023 Prepared by: [facility] 10/10 hamstring stretch with strap  Exercises - Sit to Stand  - 2 x daily - 7 x weekly - 1 sets - 10 reps - Supine Knee Extension Stretch on Towel Roll  - 2 x daily - 7 x weekly -  1 sets - 1 reps - 5 min hold - Prone Knee Extension Hang  - 2 x daily - 7 x weekly - 1 sets - 1 reps - 5 min hold  ASSESSMENT:  CLINICAL IMPRESSION: PT returns today stating he feels he can be done with therapy.  MMT now 5/5 on Rt LE and AROM 2-130.  Additional test measures revealed all goals met.  No gait deviations or issues at this point and ready for discharge.   Eval:Patient is a 67 y.o. male who was seen today for physical therapy evaluation and  treatment for M17.11 (ICD-10-CM) - Arthritis of right knee. Patient demonstrates muscle weakness, reduced ROM, and fascial restrictions which are likely contributing to symptoms of pain and are negatively impacting patient ability to perform ADLs and functional mobility tasks. Patient will benefit from skilled physical therapy services to address these deficits to reduce pain and improve level of function with ADLs and functional mobility tasks.   OBJECTIVE IMPAIRMENTS: Abnormal gait, difficulty walking, decreased ROM, decreased strength, hypomobility, increased edema, increased fascial restrictions, impaired perceived functional ability, and pain.   ACTIVITY LIMITATIONS: bending, sitting, standing, squatting, stairs, transfers, and locomotion level  PARTICIPATION LIMITATIONS: meal prep, cleaning, laundry, community activity, occupation, and yard work  [facility] POTENTIAL: Good  CLINICAL DECISION MAKING: Stable/uncomplicated  EVALUATION COMPLEXITY: Low   GOALS: Goals reviewed with patient? No  SHORT TERM GOALS: Target date: 05/10/2023 patient will be independent with initial HEP  Baseline: Goal status: in progress  2.  Patient will self report 50% improvement to improve tolerance for functional activity  Baseline:  Goal status: in progress  LONG TERM GOALS: Target date: 05/24/2023   Patient will be independent in self management strategies to improve quality of life and functional outcomes.  Baseline:  Goal status: MET  2.  Patient will self report 75% improvement to improve tolerance for functional activity  Baseline:  Goal status: MET   3.  Patient will increase knee mobility to 0 to 120 to promote normal navigation of steps; step over step pattern  Baseline:  Goal status: MET   4.  Patient will increase right leg MMTs to 5/5 without pain to promote return to ambulation community distances with minimal deviation.    Baseline:  Goal status: MET  5.  Patient will  increase distance on to 350 ft to demonstrate improved functional mobility walking household and community distances.   Baseline: 319 ft Goal status: MET  6.  Patient will improve FOTO score by 10  points to demonstrate improved perceived functional mobility Baseline: 55 Goal status: MET    PLAN:  PT FREQUENCY: 2x/week  PT DURATION: 4 weeks  PLANNED INTERVENTIONS: Therapeutic exercises, Therapeutic activity, Neuromuscular re-education, Balance training, Gait training, Patient/Family education, Joint manipulation, Joint mobilization, Stair training, Orthotic/Fit training, DME instructions, Aquatic Therapy, Dry Needling, Electrical stimulation, Spinal manipulation, Spinal mobilization, Cryotherapy, Moist heat, Compression bandaging, scar mobilization, Splintting, Taping, Traction, Ultrasound, Ionotophoresis 4mg /ml Dexamethasone, and Manual therapy   PLAN FOR NEXT SESSION:  Discharge as goals met.  11:57 AM, 05/09/23 Lurena Nida, PTA/CLT [facility] Ph: [phone]

## 2023-05-11 ENCOUNTER — Encounter (HOSPITAL_COMMUNITY): Payer: Medicare Other | Admitting: Physical Therapy

## 2023-05-12 ENCOUNTER — Encounter (HOSPITAL_COMMUNITY): Payer: Self-pay

## 2023-05-12 NOTE — Therapy (Signed)
[facility] at [facility] [address] [place], [place], [address] Phone: [phone]   Fax:  [phone]  Patient Details  Name: [name] MRN: [id number] Date of Birth: [birth year] Referring Provider:  No ref. provider found  PHYSICAL THERAPY DISCHARGE SUMMARY  Visits from Start of Care: 5  Current functional level related to goals / functional outcomes: Goals met   Remaining deficits: none   Education / Equipment: HEP   Patient agrees to discharge. Patient goals were met. Patient is being discharged due to meeting the stated rehab goals.   [facility] Out[facility] Rehabilitation at [facility] [address] [place], [place], [address] Phone: [phone]   Fax:  [phone]

## 2023-05-16 ENCOUNTER — Encounter (HOSPITAL_COMMUNITY): Payer: Medicare Other | Admitting: Physical Therapy

## 2023-05-18 ENCOUNTER — Encounter (HOSPITAL_COMMUNITY): Payer: Medicare Other

## 2023-05-23 ENCOUNTER — Encounter (HOSPITAL_COMMUNITY): Payer: Medicare Other

## 2023-05-25 ENCOUNTER — Encounter (HOSPITAL_COMMUNITY): Payer: Medicare Other | Admitting: Physical Therapy

## 2023-05-26 ENCOUNTER — Ambulatory Visit: Payer: Medicare Other | Admitting: Orthopedic Surgery

## 2023-05-26 DIAGNOSIS — Z96651 Presence of right artificial knee joint: Secondary | ICD-10-CM

## 2023-05-26 DIAGNOSIS — M1711 Unilateral primary osteoarthritis, right knee: Secondary | ICD-10-CM

## 2023-05-26 NOTE — Progress Notes (Signed)
POST OP   Encounter Diagnoses  Name Primary?   S/P total knee replacement, right 04/12/23 Yes   Arthritis of right knee     Doing well so far   ROM = 125  Walking independently   F/u at 1 year anniv

## 2023-06-06 ENCOUNTER — Encounter (HOSPITAL_COMMUNITY): Payer: Medicare Other

## 2023-06-08 ENCOUNTER — Encounter (HOSPITAL_COMMUNITY): Payer: Medicare Other

## 2023-06-13 ENCOUNTER — Encounter (HOSPITAL_COMMUNITY): Payer: Medicare Other

## 2023-06-15 ENCOUNTER — Encounter (HOSPITAL_COMMUNITY): Payer: Medicare Other

## 2024-04-12 ENCOUNTER — Ambulatory Visit: Payer: Medicare Other | Admitting: Orthopedic Surgery

## 2024-04-23 ENCOUNTER — Other Ambulatory Visit (INDEPENDENT_AMBULATORY_CARE_PROVIDER_SITE_OTHER)

## 2024-04-23 ENCOUNTER — Other Ambulatory Visit: Payer: Self-pay

## 2024-04-23 ENCOUNTER — Ambulatory Visit: Admitting: Orthopedic Surgery

## 2024-04-23 DIAGNOSIS — M7541 Impingement syndrome of right shoulder: Secondary | ICD-10-CM

## 2024-04-23 DIAGNOSIS — Z96651 Presence of right artificial knee joint: Secondary | ICD-10-CM

## 2024-04-23 DIAGNOSIS — Z96652 Presence of left artificial knee joint: Secondary | ICD-10-CM

## 2024-04-23 MED ORDER — METHYLPREDNISOLONE ACETATE 40 MG/ML IJ SUSP
40.0000 mg | Freq: Once | INTRAMUSCULAR | Status: AC
Start: 2024-04-23 — End: 2024-04-23
  Administered 2024-04-23: 40 mg via INTRA_ARTICULAR

## 2024-04-23 NOTE — Progress Notes (Signed)
    04/23/2024   Chief Complaint  Patient presents with   Knee Pain    S/p replacement Left 4/826/22 Right 04/12/23   Shoulder Pain    Right- pain for 2-3 months has trouble with motion has to lift arm to get it in position I  need it to be    Encounter Diagnoses  Name Primary?   S/P total knee replacement, left 11/11/20 Yes   S/P total knee replacement, right 04/12/23     What pharmacy do you use ? ______[facility]_____  DOI/DOS/ Date: Left 11/11/20 Right 04/12/23  Improved  68 year old male who has no significant issues or minor issues with his bilateral total knee replacements  He is flying to [place]  on Wednesday and he is having some right shoulder pain.  He has had that for several months.  He had surgery by Dr. Barbarann many years ago although he is not sure what kind of surgery it was.  He does have an incision over the front of the shoulder and his chart indicates he had a SLAP tear of his right shoulder.  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  He is having some paresthesias or paresthesia-like symptoms as well in the right arm with pain in the right shoulder  Exam exhibits a positive impingement sign  He did exhibit normal strength in abduction, empty can position, internal rotation as well as external rotation  He was stable in abduction external rotation position  Diagnosis is impingement syndrome  We recommended a subacromial injection which was performed  Right shoulder injection   Procedure note the subacromial injection shoulder RIGHT    Verbal consent was obtained to inject the  RIGHT   Shoulder  Timeout was completed to confirm the injection site is a subacromial space of the  RIGHT  shoulder   Medication used Depo-Medrol  40 mg and lidocaine  1% 3 cc  Anesthesia was provided by ethyl chloride  The injection was performed in the  RIGHT  posterior subacromial space. After pinning the skin with alcohol and anesthetized the skin with ethyl chloride the subacromial space was injected using a 20-gauge needle. There were no complications  Sterile dressing was applied.    If he continues to have problems once he returns from his trip he will come back in and we will do x-rays and reexamine

## 2024-04-23 NOTE — Progress Notes (Signed)
    04/23/2024   Chief Complaint  Patient presents with   Knee Pain    S/p replacement Left 4/826/22 Right 04/12/23    Encounter Diagnoses  Name Primary?   S/P total knee replacement, left 11/11/20 Yes   S/P total knee replacement, right 04/12/23     What pharmacy do you use ? _____[facility]_  DOI/DOS/ Date: Left 11/11/20 Right 04/12/23  Improved

## 2024-04-25 ENCOUNTER — Ambulatory Visit: Admitting: Gastroenterology

## 2024-05-03 NOTE — H&P (View-Only) (Signed)
 GI Office Note    Referring Provider: Sheryle Carwin, MD Primary Care Physician:  Sheryle Carwin, MD  Primary Gastroenterologist: Carlin POUR. Cindie, DO   Chief Complaint   Chief Complaint  Patient presents with   New Patient (Initial Visit)    Pt here for dysphagia for about 6 months     History of Present Illness   [name] is a 68 y.o. male presenting today at the request of Dr. Sheryle for further evaluation of dysphagia.   Discussed the use of AI scribe software for clinical note transcription with the patient, who gave verbal consent to proceed.   He experiences difficulty swallowing, describing it as food getting 'stuck' in his throat/neck area, particularly with solid foods. Symptoms associated with a tight feeling. He sometimes coughs to alleviate the sensation if he is unable to drink water to wash the food down. Symptoms have been really noticeable for the past six months however he feels like he has had some minor changes since last last surgery when he was intubated or since his neck surgery in 2019. He really had not appreciated a significant difference at the time but now wondering if it was the start of his symptoms. No frequent heartburn. No dysphagia to liquids or pills.   He has a history of significant weight loss, having reduced his weight from 270 pounds to about 220 pounds. He attributes part of this weight loss to the use of phentermine, which he has been taking on and off for two to three years to suppress appetite. Phentermine causes dry mouth, but he stays hydrated to manage this side effect.  His family history includes a sister who had her esophagus stretched which she found very helpful.    Socially, he smoked and consumed alcohol in his 75s. He drank on the weekends and lost his licenses for driving while impaired. This led to him going into inpatient rehab to earn his license back. He quit smoking and alcohol at that time. Denies chronic habitual use  of either substance.    Prior Data   Colonoscopy 06/2018: -three 2-60mm polyps at splenic flexure and trv colon, hyperplastic, polypoid, no adenomatous -diverticulosis in rs colon and sig colon -int/ext hemorrhoids -exam in 10 years  Medications   Current Outpatient Medications  Medication Sig Dispense Refill   atorvastatin  (LIPITOR) 40 MG tablet Take 40 mg by mouth at bedtime.      escitalopram  (LEXAPRO ) 20 MG tablet Take 20 mg by mouth every evening.      phentermine 37.5 MG capsule Take 37.5 mg by mouth every morning.     No current facility-administered medications for this visit.    Allergies   Allergies as of 05/04/2024 - Review Complete 05/04/2024  Allergen Reaction Noted   Codeine Other (See Comments)    Meloxicam  Itching 03/14/2013    Past Medical History   Past Medical History:  Diagnosis Date   Anxiety    some panic attacks- off medications   Arthritis    Complication of anesthesia    woke up during surgery- Elbow surgery, 2009 and again with foot surgeryry    Depression    GERD (gastroesophageal reflux disease)    once in awhile   Hypercholesteremia    Sleep apnea    has lost 'abunch of weight' its been yrs since i used it    Past Surgical History   Past Surgical History:  Procedure Laterality Date   ANTERIOR CERVICAL DECOMP/DISCECTOMY FUSION N/A  12/21/2017   Procedure: C3-4 ANTERIOR CERVICAL DECOMPRESSION/DISCECTOMY FUSION, ALLOGRAFT, PLATE;  Surgeon: Barbarann Oneil BROCKS, MD;  Location: [facility];  Service: Orthopedics;  Laterality: N/A;   CATARACT EXTRACTION Right    CATARACT EXTRACTION Left    COLONOSCOPY  2008   Dr. Harvey: Hyperplastic polyp removed, diverticulosis.   COLONOSCOPY WITH PROPOFOL  N/A 07/11/2018   Procedure: COLONOSCOPY WITH PROPOFOL ;  Surgeon: Harvey Margo CROME, MD;  Location: [facility];  Service: Endoscopy;  Laterality: N/A;  7:30am   FOOT SURGERY     left elbow      LUMBAR LAMINECTOMY     back in 2015   MASS EXCISION Left 09/12/2019    Procedure: EXCISION GOUTY TOPHI;  Surgeon: Blinda Katz, DPM;  Location: [facility];  Service: Podiatry;  Laterality: Left;   POLYPECTOMY  07/11/2018   Procedure: POLYPECTOMY;  Surgeon: Harvey Margo CROME, MD;  [facility]: [facility];  Service: Endoscopy;;  colon   TOTAL KNEE ARTHROPLASTY Left 11/11/2020   Procedure: LEFT TOTAL KNEE ARTHROPLASTY;  Surgeon: Margrette Taft BRAVO, MD;  Location: [facility];  Service: Orthopedics;  Laterality: Left;   TOTAL KNEE ARTHROPLASTY Right 04/12/2023   Procedure: TOTAL KNEE ARTHROPLASTY;  Surgeon: Margrette Taft BRAVO, MD;  Location: [facility];  Service: Orthopedics;  Laterality: Right;    Past Family History   Family History  Problem Relation Age of Onset   Heart disease Mother    Heart disease Father    Other Sister        esophagus stretched   Lung cancer Sister    Lung cancer Brother    Brain cancer Paternal Uncle    Other Cousin        colostomy    Past Social History   Social History   Socioeconomic History   Marital status: Divorced    Spouse name: Not on file   Number of children: Not on file   Years of education: Not on file   Highest education level: Not on file  Occupational History   Not on file  Tobacco Use   Smoking status: Former    Types: Cigarettes   Smokeless tobacco: Never  Vaping Use   Vaping status: Never Used  Substance and Sexual Activity   Alcohol use: No    Comment: he drank on weeksends in his 49s, went to rehab due to losing licence, quit all etoh when he got out (1985)cohol 1985   Drug use: No   Sexual activity: Not on file    Comment: quit  1986  Other Topics Concern   Not on file  Social History Narrative   Not on file   Social Drivers of Health   Financial Resource Strain: Not on file  Food Insecurity: No Food Insecurity (04/12/2023)   Hunger Vital Sign    Worried About Running Out of Food in the Last Year: Never true    Ran Out of Food in the Last Year: Never true  Transportation Needs: No  Transportation Needs (04/12/2023)   PRAPARE - Administrator, Civil Service (Medical): No    Lack of Transportation (Non-Medical): No  Physical Activity: Not on file  Stress: Not on file  Social Connections: Not on file  Intimate Partner Violence: Not At Risk (04/12/2023)   Humiliation, Afraid, Rape, and Kick questionnaire    Fear of Current or Ex-Partner: No    Emotionally Abused: No    Physically Abused: No    Sexually Abused: No    Review of  Systems   General: Negative for anorexia, weight loss, fever, chills, fatigue, weakness. Eyes: Negative for vision changes.  ENT: Negative for hoarseness, difficulty swallowing , nasal congestion.see hpi CV: Negative for chest pain, angina, palpitations, dyspnea on exertion, peripheral edema.  Respiratory: Negative for dyspnea at rest, dyspnea on exertion, cough, sputum, wheezing.  GI: See history of present illness. GU:  Negative for dysuria, hematuria, urinary incontinence, urinary frequency, nocturnal urination.  MS: Negative for joint pain, low back pain.  Derm: Negative for rash or itching.  Neuro: Negative for weakness, abnormal sensation, seizure, frequent headaches, memory loss,  confusion.  Psych: Negative for anxiety, depression, suicidal ideation, hallucinations.  Endo: Negative for unusual weight change.  Heme: Negative for bruising or bleeding. Allergy: Negative for rash or hives.  Physical Exam   BP (!) 153/82   Pulse 86   Temp 98.6 F (37 C)   Ht 5' 10 (1.778 m)   Wt 221 lb 6.4 oz (100.4 kg)   BMI 31.77 kg/m    General: Well-nourished, well-developed in no acute distress.  Head: Normocephalic, atraumatic.   Eyes: Conjunctiva pink, no icterus. Mouth: Oropharyngeal mucosa moist and pink  Neck: Supple without thyromegaly, masses, or lymphadenopathy.  Lungs: Clear to auscultation bilaterally.  Heart: Regular rate and rhythm, no murmurs rubs or gallops.  Abdomen: Bowel sounds are normal, nontender,  nondistended, no hepatosplenomegaly or masses,  no abdominal bruits or hernia, no rebound or guarding.   Rectal: not performed Extremities: No lower extremity edema. No clubbing or deformities.  Neuro: Alert and oriented x 4 , grossly normal neurologically.  Skin: Warm and dry, no rash or jaundice.   Psych: Alert and cooperative, normal mood and affect.  Labs   Lab Results  Component Value Date   NA 136 04/13/2023   CL 101 04/13/2023   K 4.0 04/13/2023   CO2 26 04/13/2023   BUN 16 04/13/2023   CREATININE 0.96 04/13/2023   GFRNONAA >60 04/13/2023   CALCIUM  8.5 (L) 04/13/2023   ALBUMIN 4.5 12/15/2017   GLUCOSE 140 (H) 04/13/2023   Lab Results  Component Value Date   ALT 23 12/15/2017   AST 22 12/15/2017   ALKPHOS 59 12/15/2017   BILITOT 1.0 12/15/2017   Lab Results  Component Value Date   WBC 10.9 (H) 04/12/2023   HGB 12.9 (L) 04/12/2023   HCT 39.3 04/12/2023   MCV 86.8 04/12/2023   PLT 211 04/12/2023    Imaging Studies   DG Knee AP/LAT W/Sunrise Left Result Date: 04/25/2024 Radiology report 3 views left  knee AP lateral and patellar sunrise views of the knee FINDINGS: A total knee prosthesis is noted. It is in anatomic alignment. There is no evidence of loosening. Impression : normal TKA  xray    DG Knee AP/LAT W/Sunrise Right Result Date: 04/25/2024 Right total knee Follow-up x-ray X-ray shows no sign of loosening normal alignment good component position Impression stable right total knee arthroplasty without loosening    Assessment/Plan:        Dysphagia Intermittent sensation of food sticking in the throat/neck area, primarily with solids. Worse over the past six months. No significant heartburn. Differential diagnosis includes structural issues from esophageal stricture, esophagitis, esophageal motility d/o, Zenker's diverticulum, related to previous neck surgery. - EGD/ED with Dr. Cindie. ASA 2.  I have discussed the risks, alternatives, benefits with regards  to but not limited to the risk of reaction to medication, bleeding, infection, perforation and the patient is agreeable to proceed. Written consent to  be obtained. Discussed risk of complication from IV site such as phlebitis, clot. - Hold phentermine for seven days prior to the procedure.  - if EGD/ED unremarkable, patient aware additional work up may be needed.     Sonny RAMAN. Ezzard, MHS, PA-C [facility]

## 2024-05-03 NOTE — Progress Notes (Signed)
 GI Office Note    Referring Provider: Sheryle Carwin, MD Primary Care Physician:  Sheryle Carwin, MD  Primary Gastroenterologist: Carlin POUR. Cindie, DO   Chief Complaint   Chief Complaint  Patient presents with   New Patient (Initial Visit)    Pt here for dysphagia for about 6 months     History of Present Illness   [name] is a 68 y.o. male presenting today at the request of Dr. Sheryle for further evaluation of dysphagia.   Discussed the use of AI scribe software for clinical note transcription with the patient, who gave verbal consent to proceed.   He experiences difficulty swallowing, describing it as food getting 'stuck' in his throat/neck area, particularly with solid foods. Symptoms associated with a tight feeling. He sometimes coughs to alleviate the sensation if he is unable to drink water to wash the food down. Symptoms have been really noticeable for the past six months however he feels like he has had some minor changes since last last surgery when he was intubated or since his neck surgery in 2019. He really had not appreciated a significant difference at the time but now wondering if it was the start of his symptoms. No frequent heartburn. No dysphagia to liquids or pills.   He has a history of significant weight loss, having reduced his weight from 270 pounds to about 220 pounds. He attributes part of this weight loss to the use of phentermine, which he has been taking on and off for two to three years to suppress appetite. Phentermine causes dry mouth, but he stays hydrated to manage this side effect.  His family history includes a sister who had her esophagus stretched which she found very helpful.    Socially, he smoked and consumed alcohol in his 74s. He drank on the weekends and lost his licenses for driving while impaired. This led to him going into inpatient rehab to earn his license back. He quit smoking and alcohol at that time. Denies chronic habitual use  of either substance.    Prior Data   Colonoscopy 06/2018: -three 2-60mm polyps at splenic flexure and trv colon, hyperplastic, polypoid, no adenomatous -diverticulosis in rs colon and sig colon -int/ext hemorrhoids -exam in 10 years  Medications   Current Outpatient Medications  Medication Sig Dispense Refill   atorvastatin  (LIPITOR) 40 MG tablet Take 40 mg by mouth at bedtime.      escitalopram  (LEXAPRO ) 20 MG tablet Take 20 mg by mouth every evening.      phentermine 37.5 MG capsule Take 37.5 mg by mouth every morning.     No current facility-administered medications for this visit.    Allergies   Allergies as of 05/04/2024 - Review Complete 05/04/2024  Allergen Reaction Noted   Codeine Other (See Comments)    Meloxicam  Itching 03/14/2013    Past Medical History   Past Medical History:  Diagnosis Date   Anxiety    some panic attacks- off medications   Arthritis    Complication of anesthesia    woke up during surgery- Elbow surgery, 2009 and again with foot surgeryry    Depression    GERD (gastroesophageal reflux disease)    once in awhile   Hypercholesteremia    Sleep apnea    has lost 'abunch of weight' its been yrs since i used it    Past Surgical History   Past Surgical History:  Procedure Laterality Date   ANTERIOR CERVICAL DECOMP/DISCECTOMY FUSION N/A  12/21/2017   Procedure: C3-4 ANTERIOR CERVICAL DECOMPRESSION/DISCECTOMY FUSION, ALLOGRAFT, PLATE;  Surgeon: Barbarann Oneil BROCKS, MD;  Location: [facility];  Service: Orthopedics;  Laterality: N/A;   CATARACT EXTRACTION Right    CATARACT EXTRACTION Left    COLONOSCOPY  2008   Dr. Harvey: Hyperplastic polyp removed, diverticulosis.   COLONOSCOPY WITH PROPOFOL  N/A 07/11/2018   Procedure: COLONOSCOPY WITH PROPOFOL ;  Surgeon: Harvey Margo CROME, MD;  Location: [facility];  Service: Endoscopy;  Laterality: N/A;  7:30am   FOOT SURGERY     left elbow      LUMBAR LAMINECTOMY     back in 2015   MASS EXCISION Left 09/12/2019    Procedure: EXCISION GOUTY TOPHI;  Surgeon: Blinda Katz, DPM;  Location: [facility];  Service: Podiatry;  Laterality: Left;   POLYPECTOMY  07/11/2018   Procedure: POLYPECTOMY;  Surgeon: Harvey Margo CROME, MD;  [facility]: [facility];  Service: Endoscopy;;  colon   TOTAL KNEE ARTHROPLASTY Left 11/11/2020   Procedure: LEFT TOTAL KNEE ARTHROPLASTY;  Surgeon: Margrette Taft BRAVO, MD;  Location: [facility];  Service: Orthopedics;  Laterality: Left;   TOTAL KNEE ARTHROPLASTY Right 04/12/2023   Procedure: TOTAL KNEE ARTHROPLASTY;  Surgeon: Margrette Taft BRAVO, MD;  Location: [facility];  Service: Orthopedics;  Laterality: Right;    Past Family History   Family History  Problem Relation Age of Onset   Heart disease Mother    Heart disease Father    Other Sister        esophagus stretched   Lung cancer Sister    Lung cancer Brother    Brain cancer Paternal Uncle    Other Cousin        colostomy    Past Social History   Social History   Socioeconomic History   Marital status: Divorced    Spouse name: Not on file   Number of children: Not on file   Years of education: Not on file   Highest education level: Not on file  Occupational History   Not on file  Tobacco Use   Smoking status: Former    Types: Cigarettes   Smokeless tobacco: Never  Vaping Use   Vaping status: Never Used  Substance and Sexual Activity   Alcohol use: No    Comment: he drank on weeksends in his 49s, went to rehab due to losing licence, quit all etoh when he got out (1985)cohol 1985   Drug use: No   Sexual activity: Not on file    Comment: quit  1986  Other Topics Concern   Not on file  Social History Narrative   Not on file   Social Drivers of Health   Financial Resource Strain: Not on file  Food Insecurity: No Food Insecurity (04/12/2023)   Hunger Vital Sign    Worried About Running Out of Food in the Last Year: Never true    Ran Out of Food in the Last Year: Never true  Transportation Needs: No  Transportation Needs (04/12/2023)   PRAPARE - Administrator, Civil Service (Medical): No    Lack of Transportation (Non-Medical): No  Physical Activity: Not on file  Stress: Not on file  Social Connections: Not on file  Intimate Partner Violence: Not At Risk (04/12/2023)   Humiliation, Afraid, Rape, and Kick questionnaire    Fear of Current or Ex-Partner: No    Emotionally Abused: No    Physically Abused: No    Sexually Abused: No    Review of  Systems   General: Negative for anorexia, weight loss, fever, chills, fatigue, weakness. Eyes: Negative for vision changes.  ENT: Negative for hoarseness, difficulty swallowing , nasal congestion.see hpi CV: Negative for chest pain, angina, palpitations, dyspnea on exertion, peripheral edema.  Respiratory: Negative for dyspnea at rest, dyspnea on exertion, cough, sputum, wheezing.  GI: See history of present illness. GU:  Negative for dysuria, hematuria, urinary incontinence, urinary frequency, nocturnal urination.  MS: Negative for joint pain, low back pain.  Derm: Negative for rash or itching.  Neuro: Negative for weakness, abnormal sensation, seizure, frequent headaches, memory loss,  confusion.  Psych: Negative for anxiety, depression, suicidal ideation, hallucinations.  Endo: Negative for unusual weight change.  Heme: Negative for bruising or bleeding. Allergy: Negative for rash or hives.  Physical Exam   BP (!) 153/82   Pulse 86   Temp 98.6 F (37 C)   Ht 5' 10 (1.778 m)   Wt 221 lb 6.4 oz (100.4 kg)   BMI 31.77 kg/m    General: Well-nourished, well-developed in no acute distress.  Head: Normocephalic, atraumatic.   Eyes: Conjunctiva pink, no icterus. Mouth: Oropharyngeal mucosa moist and pink  Neck: Supple without thyromegaly, masses, or lymphadenopathy.  Lungs: Clear to auscultation bilaterally.  Heart: Regular rate and rhythm, no murmurs rubs or gallops.  Abdomen: Bowel sounds are normal, nontender,  nondistended, no hepatosplenomegaly or masses,  no abdominal bruits or hernia, no rebound or guarding.   Rectal: not performed Extremities: No lower extremity edema. No clubbing or deformities.  Neuro: Alert and oriented x 4 , grossly normal neurologically.  Skin: Warm and dry, no rash or jaundice.   Psych: Alert and cooperative, normal mood and affect.  Labs   Lab Results  Component Value Date   NA 136 04/13/2023   CL 101 04/13/2023   K 4.0 04/13/2023   CO2 26 04/13/2023   BUN 16 04/13/2023   CREATININE 0.96 04/13/2023   GFRNONAA >60 04/13/2023   CALCIUM  8.5 (L) 04/13/2023   ALBUMIN 4.5 12/15/2017   GLUCOSE 140 (H) 04/13/2023   Lab Results  Component Value Date   ALT 23 12/15/2017   AST 22 12/15/2017   ALKPHOS 59 12/15/2017   BILITOT 1.0 12/15/2017   Lab Results  Component Value Date   WBC 10.9 (H) 04/12/2023   HGB 12.9 (L) 04/12/2023   HCT 39.3 04/12/2023   MCV 86.8 04/12/2023   PLT 211 04/12/2023    Imaging Studies   DG Knee AP/LAT W/Sunrise Left Result Date: 04/25/2024 Radiology report 3 views left  knee AP lateral and patellar sunrise views of the knee FINDINGS: A total knee prosthesis is noted. It is in anatomic alignment. There is no evidence of loosening. Impression : normal TKA  xray    DG Knee AP/LAT W/Sunrise Right Result Date: 04/25/2024 Right total knee Follow-up x-ray X-ray shows no sign of loosening normal alignment good component position Impression stable right total knee arthroplasty without loosening    Assessment/Plan:        Dysphagia Intermittent sensation of food sticking in the throat/neck area, primarily with solids. Worse over the past six months. No significant heartburn. Differential diagnosis includes structural issues from esophageal stricture, esophagitis, esophageal motility d/o, Zenker's diverticulum, related to previous neck surgery. - EGD/ED with Dr. Cindie. ASA 2.  I have discussed the risks, alternatives, benefits with regards  to but not limited to the risk of reaction to medication, bleeding, infection, perforation and the patient is agreeable to proceed. Written consent to  be obtained. Discussed risk of complication from IV site such as phlebitis, clot. - Hold phentermine for seven days prior to the procedure.  - if EGD/ED unremarkable, patient aware additional work up may be needed.     Sonny RAMAN. Ezzard, MHS, PA-C [facility]

## 2024-05-04 ENCOUNTER — Encounter: Payer: Self-pay | Admitting: *Deleted

## 2024-05-04 ENCOUNTER — Ambulatory Visit: Admitting: Gastroenterology

## 2024-05-04 ENCOUNTER — Telehealth: Payer: Self-pay | Admitting: *Deleted

## 2024-05-04 ENCOUNTER — Other Ambulatory Visit: Payer: Self-pay | Admitting: *Deleted

## 2024-05-04 ENCOUNTER — Encounter: Payer: Self-pay | Admitting: Gastroenterology

## 2024-05-04 VITALS — BP 153/82 | HR 86 | Temp 98.6°F | Ht 70.0 in | Wt 221.4 lb

## 2024-05-04 DIAGNOSIS — R09A2 Foreign body sensation, throat: Secondary | ICD-10-CM | POA: Diagnosis not present

## 2024-05-04 DIAGNOSIS — R1310 Dysphagia, unspecified: Secondary | ICD-10-CM | POA: Diagnosis not present

## 2024-05-04 NOTE — Patient Instructions (Signed)
 Plan for upper endoscopy with Dr. Cindie. You will hold phentermine for 7 days prior to your procedure.  For now, avoiding dry, touch meats/breads, raw vegetables. Use plenty of water with meals and taking medications.

## 2024-05-04 NOTE — Telephone Encounter (Signed)
 [employer] PA:  Notification or Prior Authorization is not required for the requested services You are not required to submit a notification/prior authorization based on the information provided. If you have general questions about the prior authorization requirements, visit [email or web address] > Clinician Resources > Advance and Admission Notification Requirements. The number above acknowledges your notification. Please write this reference number down for future reference. If you would like to request an organization determination, please call us  at [phone]. Decision ID #: [id number]

## 2024-05-04 NOTE — Progress Notes (Signed)
  error

## 2024-05-08 ENCOUNTER — Encounter (HOSPITAL_COMMUNITY)
Admission: RE | Admit: 2024-05-08 | Discharge: 2024-05-08 | Disposition: A | Discharge status: Home / Self Care | Source: Ambulatory Visit | Attending: Internal Medicine | Admitting: Internal Medicine

## 2024-05-08 NOTE — Progress Notes (Signed)
 Attempted pre-operative call.  Left voicemail.

## 2024-05-09 NOTE — Pre-Procedure Instructions (Signed)
 Attempted 2nd pre-op phone call. Left 2nd VM for him to call us  back.

## 2024-05-10 ENCOUNTER — Encounter (HOSPITAL_COMMUNITY): Payer: Self-pay

## 2024-05-10 NOTE — Pre-Procedure Instructions (Signed)
Attempted pre-op phone call. Left VM for patient to call back. 

## 2024-05-14 ENCOUNTER — Encounter (HOSPITAL_COMMUNITY): Payer: Self-pay | Admitting: Internal Medicine

## 2024-05-14 ENCOUNTER — Other Ambulatory Visit: Payer: Self-pay

## 2024-05-14 ENCOUNTER — Ambulatory Visit (HOSPITAL_COMMUNITY)
Admission: RE | Admit: 2024-05-14 | Discharge: 2024-05-14 | Disposition: A | Discharge status: Home / Self Care | Attending: Internal Medicine | Admitting: Internal Medicine

## 2024-05-14 ENCOUNTER — Ambulatory Visit (HOSPITAL_COMMUNITY)

## 2024-05-14 ENCOUNTER — Encounter (HOSPITAL_COMMUNITY)
Admission: RE | Disposition: A | Discharge status: Home / Self Care | Payer: Self-pay | Source: Home / Self Care | Attending: Internal Medicine

## 2024-05-14 DIAGNOSIS — Z87891 Personal history of nicotine dependence: Secondary | ICD-10-CM | POA: Insufficient documentation

## 2024-05-14 DIAGNOSIS — K2950 Unspecified chronic gastritis without bleeding: Secondary | ICD-10-CM | POA: Diagnosis not present

## 2024-05-14 DIAGNOSIS — R682 Dry mouth, unspecified: Secondary | ICD-10-CM | POA: Insufficient documentation

## 2024-05-14 DIAGNOSIS — G4733 Obstructive sleep apnea (adult) (pediatric): Secondary | ICD-10-CM

## 2024-05-14 DIAGNOSIS — F418 Other specified anxiety disorders: Secondary | ICD-10-CM

## 2024-05-14 DIAGNOSIS — K2970 Gastritis, unspecified, without bleeding: Secondary | ICD-10-CM | POA: Diagnosis not present

## 2024-05-14 DIAGNOSIS — T505X5A Adverse effect of appetite depressants, initial encounter: Secondary | ICD-10-CM | POA: Diagnosis not present

## 2024-05-14 DIAGNOSIS — B9681 Helicobacter pylori [H. pylori] as the cause of diseases classified elsewhere: Secondary | ICD-10-CM | POA: Insufficient documentation

## 2024-05-14 DIAGNOSIS — F419 Anxiety disorder, unspecified: Secondary | ICD-10-CM | POA: Insufficient documentation

## 2024-05-14 DIAGNOSIS — R1310 Dysphagia, unspecified: Secondary | ICD-10-CM | POA: Diagnosis present

## 2024-05-14 DIAGNOSIS — F32A Depression, unspecified: Secondary | ICD-10-CM | POA: Diagnosis not present

## 2024-05-14 DIAGNOSIS — G4730 Sleep apnea, unspecified: Secondary | ICD-10-CM | POA: Insufficient documentation

## 2024-05-14 HISTORY — PX: ESOPHAGEAL DILATION: SHX303

## 2024-05-14 HISTORY — PX: ESOPHAGOGASTRODUODENOSCOPY: SHX5428

## 2024-05-14 SURGERY — EGD (ESOPHAGOGASTRODUODENOSCOPY)
Anesthesia: General

## 2024-05-14 MED ORDER — LIDOCAINE 2% (20 MG/ML) 5 ML SYRINGE
INTRAMUSCULAR | Status: DC | PRN
  Administered 2024-05-14: 100 mg via INTRAVENOUS

## 2024-05-14 MED ORDER — PANTOPRAZOLE SODIUM 40 MG PO TBEC: 40.0000 mg | DELAYED_RELEASE_TABLET | Freq: Every day | ORAL | 11 refills | Status: DC

## 2024-05-14 MED ORDER — PROPOFOL 10 MG/ML IV BOLUS
INTRAVENOUS | Status: DC | PRN
  Administered 2024-05-14 (×2): 50 mg via INTRAVENOUS
  Administered 2024-05-14: 100 mg via INTRAVENOUS

## 2024-05-14 MED ORDER — LACTATED RINGERS IV SOLN: INTRAVENOUS | Status: DC

## 2024-05-14 NOTE — Anesthesia Postprocedure Evaluation (Signed)
 Anesthesia Post Note  Patient: [name]  Procedure(s) Performed: EGD (ESOPHAGOGASTRODUODENOSCOPY) DILATION, ESOPHAGUS  Anesthesia Type: General Anesthetic complications: no   No notable events documented.   Last Vitals:  Vitals:   05/14/24 0929 05/14/24 0930  BP: (!) 82/61 (!) 94/53  Pulse: 81   Resp: 18   Temp: 36.7 C   SpO2: 93% 96%    Last Pain:  Vitals:   05/14/24 0929  TempSrc: Oral  PainSc: Asleep                 Andrea Limes

## 2024-05-14 NOTE — Transfer of Care (Signed)
 Immediate Anesthesia Transfer of Care Note  Patient: [name]  Procedure(s) Performed: EGD (ESOPHAGOGASTRODUODENOSCOPY) DILATION, ESOPHAGUS  Patient Location: Short Stay  Anesthesia Type:General  Level of Consciousness: awake, alert , oriented, and patient cooperative  Airway & Oxygen Therapy: Patient Spontanous Breathing  Post-op Assessment: Report given to RN, Post -op Vital signs reviewed and stable, and Patient moving all extremities  Post vital signs: Reviewed and stable  Last Vitals:  Vitals Value Taken Time  BP 94/53 05/14/24 09:30  Temp 36.7 C 05/14/24 09:29  Pulse 81 05/14/24 09:29  Resp 18 05/14/24 09:29  SpO2 96 % 05/14/24 09:30    Last Pain:  Vitals:   05/14/24 0929  TempSrc: Oral  PainSc: Asleep         Complications: No notable events documented.

## 2024-05-14 NOTE — Anesthesia Preprocedure Evaluation (Addendum)
 Anesthesia Evaluation  Patient identified by MRN, date of birth, ID band Patient awake    Reviewed: Allergy & Precautions, H&P , NPO status , Patient's Chart, lab work & pertinent test results  History of Anesthesia Complications (+) AWARENESS UNDER ANESTHESIA and history of anesthetic complications  Airway Mallampati: II  TM Distance: >3 FB Neck ROM: Full    Dental  (+) Edentulous Upper, Edentulous Lower   Pulmonary sleep apnea , former smoker   Pulmonary exam normal breath sounds clear to auscultation       Cardiovascular negative cardio ROS Normal cardiovascular exam Rhythm:Regular Rate:Normal     Neuro/Psych  PSYCHIATRIC DISORDERS Anxiety Depression    negative neurological ROS     GI/Hepatic Neg liver ROS,GERD  ,,  Endo/Other  negative endocrine ROS    Renal/GU negative Renal ROS  negative genitourinary   Musculoskeletal  (+) Arthritis ,    Abdominal   Peds negative pediatric ROS (+)  Hematology negative hematology ROS (+)   Anesthesia Other Findings   Reproductive/Obstetrics negative OB ROS                              Anesthesia Physical Anesthesia Plan  ASA: 2  Anesthesia Plan: General   Post-op Pain Management:    Induction: Intravenous  PONV Risk Score and Plan:   Airway Management Planned: Nasal Cannula  Additional Equipment:   Intra-op Plan:   Post-operative Plan:   Informed Consent: I have reviewed the patients History and Physical, chart, labs and discussed the procedure including the risks, benefits and alternatives for the proposed anesthesia with the patient or authorized representative who has indicated his/her understanding and acceptance.     Dental advisory given  Plan Discussed with: CRNA  Anesthesia Plan Comments:          Anesthesia Quick Evaluation

## 2024-05-14 NOTE — Op Note (Signed)
 [facility] Patient Name: [name] Procedure Date: 05/14/2024 9:04 AM MRN: [id number] Date of Birth: February 18, 1956 Attending MD: Carlin POUR. Cindie , [place], [id number] CSN: [id number] Age: 68 Admit Type: Outpatient Procedure:                Upper GI endoscopy Indications:              Dysphagia Providers:                Carlin POUR. Cindie, DO, Crystal Page, Kristine L.                            Shirlean Balm, Technician Referring MD:              Medicines:                See the Anesthesia note for documentation of the                            administered medications Complications:            No immediate complications. Estimated Blood Loss:     Estimated blood loss was minimal. Procedure:                Pre-Anesthesia Assessment:                           - The anesthesia plan was to use monitored                            anesthesia care (MAC).                           After obtaining informed consent, the endoscope was                            passed under direct vision. Throughout the                            procedure, the patient's blood pressure, pulse, and                            oxygen saturations were monitored continuously. The                            [id number] ([id number]) Upper was introduced through                            the mouth, and advanced to the second part of                            duodenum. The upper GI endoscopy was accomplished                            without difficulty. The patient tolerated the                            procedure well. Scope In: 9:18:30 AM  Scope Out: 9:22:24 AM Total Procedure Duration: 0 hours 3 minutes 54 seconds  Findings:      There is no endoscopic evidence of stenosis or stricture in the entire       esophagus.      Biopsies were taken with a cold forceps in the middle third of the       esophagus for histology.      Patchy mild inflammation was found in the entire examined stomach.       Biopsies were  taken with a cold forceps for Helicobacter pylori testing.      The duodenal bulb, first portion of the duodenum and second portion of       the duodenum were normal. Impression:               - Gastritis. Biopsied.                           - Normal duodenal bulb, first portion of the                            duodenum and second portion of the duodenum.                           - Biopsies were taken with a cold forceps for                            histology in the middle third of the esophagus. Moderate Sedation:      Per Anesthesia Care Recommendation:           - Patient has a contact number available for                            emergencies. The signs and symptoms of potential                            delayed complications were discussed with the                            patient. Return to normal activities tomorrow.                            Written discharge instructions were provided to the                            patient.                           - Resume previous diet.                           - Continue present medications.                           - Await pathology results.                           - Return to GI clinic in 3 months.                           -  Use Protonix  (pantoprazole ) 40 mg PO daily. Procedure Code(s):        --- Professional ---                           [phone], Esophagogastroduodenoscopy, flexible,                            transoral; with biopsy, single or multiple Diagnosis Code(s):        --- Professional ---                           K29.70, Gastritis, unspecified, without bleeding                           R13.10, Dysphagia, unspecified CPT copyright 2022 American Medical Association. All rights reserved. The codes documented in this report are preliminary and upon coder review may  be revised to meet current compliance requirements. Carlin POUR. Cindie, DO Carlin POUR. Cindie, DO 05/14/2024 9:26:59 AM This report has been signed  electronically. Number of Addenda: 0

## 2024-05-14 NOTE — Discharge Instructions (Addendum)
 EGD Discharge instructions Please read the instructions outlined below and refer to this sheet in the next few weeks. These discharge instructions provide you with general information on caring for yourself after you leave the hospital. Your doctor may also give you specific instructions. While your treatment has been planned according to the most current medical practices available, unavoidable complications occasionally occur. If you have any problems or questions after discharge, please call your doctor. ACTIVITY You may resume your regular activity but move at a slower pace for the next 24 hours.  Take frequent rest periods for the next 24 hours.  Walking will help expel (get rid of) the air and reduce the bloated feeling in your abdomen.  No driving for 24 hours (because of the anesthesia (medicine) used during the test).  You may shower.  Do not sign any important legal documents or operate any machinery for 24 hours (because of the anesthesia used during the test).  NUTRITION Drink plenty of fluids.  You may resume your normal diet.  Begin with a light meal and progress to your normal diet.  Avoid alcoholic beverages for 24 hours or as instructed by your caregiver.  MEDICATIONS You may resume your normal medications unless your caregiver tells you otherwise.  WHAT YOU CAN EXPECT TODAY You may experience abdominal discomfort such as a feeling of fullness or "gas" pains.  FOLLOW-UP Your doctor will discuss the results of your test with you.  SEEK IMMEDIATE MEDICAL ATTENTION IF ANY OF THE FOLLOWING OCCUR: Excessive nausea (feeling sick to your stomach) and/or vomiting.  Severe abdominal pain and distention (swelling).  Trouble swallowing.  Temperature over 101 F (37.8 C).  Rectal bleeding or vomiting of blood.    Your EGD revealed mild amount inflammation in your stomach.  I took biopsies of this to rule out infection with a bacteria called H. pylori.  I also took samples of your  esophagus.  Small bowel appeared normal.  Await pathology results, my office will contact you.  Your esophagus was wide open.  I did not need to stretch it today.    I am going to start you on a new medication called pantoprazole  40 mg daily.  This medication works best to be taken at least 30 minutes before breakfast.  Follow-up in GI office in 2 to 3 months.  I hope you have a great rest of your week!  Carlin POUR. Cindie, D.O. Gastroenterology and Hepatology [facility]

## 2024-05-14 NOTE — Interval H&P Note (Signed)
 History and Physical Interval Note:  05/14/2024 8:43 AM  [name]  has presented today for surgery, with the diagnosis of dysphagia.  The various methods of treatment have been discussed with the patient and family. After consideration of risks, benefits and other options for treatment, the patient has consented to  Procedure(s) with comments: EGD (ESOPHAGOGASTRODUODENOSCOPY) (N/A) - 10:00 am, asa 2 DILATION, ESOPHAGUS (N/A) as a surgical intervention.  The patient's history has been reviewed, patient examined, no change in status, stable for surgery.  I have reviewed the patient's chart and labs.  Questions were answered to the patient's satisfaction.     [name]

## 2024-05-15 ENCOUNTER — Encounter (HOSPITAL_COMMUNITY): Payer: Self-pay | Admitting: Internal Medicine

## 2024-05-15 LAB — SURGICAL PATHOLOGY

## 2024-06-19 ENCOUNTER — Encounter: Payer: Self-pay | Admitting: Emergency Medicine

## 2024-06-19 ENCOUNTER — Ambulatory Visit
Admission: EM | Admit: 2024-06-19 | Discharge: 2024-06-19 | Disposition: A | Discharge status: Home / Self Care | Attending: Nurse Practitioner | Admitting: Nurse Practitioner

## 2024-06-19 DIAGNOSIS — Z114 Encounter for screening for human immunodeficiency virus [HIV]: Secondary | ICD-10-CM

## 2024-06-19 DIAGNOSIS — W460XXA Contact with hypodermic needle, initial encounter: Secondary | ICD-10-CM

## 2024-06-19 MED ORDER — DESCOVY 200-25 MG PO TABS: 1.0000 | ORAL_TABLET | Freq: Every day | ORAL | 0 refills | Status: DC

## 2024-06-19 NOTE — ED Triage Notes (Signed)
 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  States cleaned the area right after needle stick happened.  States has never had Heb B vaccines.

## 2024-06-19 NOTE — Discharge Instructions (Signed)
 Your test results are pending.  You will be contacted if the pending test results are abnormal. Take medication as prescribed.  As discussed, if you have difficulty tolerating the medication, please contact your primary care physician before discontinuing it. You will need to have repeat testing for HIV, hepatitis B, and hepatitis C 6 weeks from today and 4 months from today. Follow-up as needed.
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
## 2024-06-20 ENCOUNTER — Emergency Department (HOSPITAL_COMMUNITY)
Admission: EM | Admit: 2024-06-20 | Discharge: 2024-06-20 | Disposition: A | Discharge status: Home / Self Care | Attending: Emergency Medicine | Admitting: Emergency Medicine

## 2024-06-20 ENCOUNTER — Other Ambulatory Visit: Payer: Self-pay

## 2024-06-20 ENCOUNTER — Emergency Department (HOSPITAL_COMMUNITY)

## 2024-06-20 ENCOUNTER — Ambulatory Visit (HOSPITAL_COMMUNITY): Payer: Self-pay

## 2024-06-20 ENCOUNTER — Encounter (HOSPITAL_COMMUNITY): Payer: Self-pay

## 2024-06-20 DIAGNOSIS — R079 Chest pain, unspecified: Secondary | ICD-10-CM

## 2024-06-20 LAB — CBC WITH DIFFERENTIAL/PLATELET
Abs Immature Granulocytes: 0.01 K/uL (ref 0.00–0.07)
Basophils Absolute: 0 K/uL (ref 0.0–0.1)
Basophils Relative: 1 %
Eosinophils Absolute: 0.2 K/uL (ref 0.0–0.5)
Eosinophils Relative: 2 %
HCT: 41.4 % (ref 39.0–52.0)
Hemoglobin: 14.1 g/dL (ref 13.0–17.0)
Immature Granulocytes: 0 %
Lymphocytes Relative: 30 %
Lymphs Abs: 2 K/uL (ref 0.7–4.0)
MCH: 29.2 pg (ref 26.0–34.0)
MCHC: 34.1 g/dL (ref 30.0–36.0)
MCV: 85.7 fL (ref 80.0–100.0)
Monocytes Absolute: 0.6 K/uL (ref 0.1–1.0)
Monocytes Relative: 9 %
Neutro Abs: 4 K/uL (ref 1.7–7.7)
Neutrophils Relative %: 58 %
Platelets: 261 K/uL (ref 150–400)
RBC: 4.83 MIL/uL (ref 4.22–5.81)
RDW: 13.5 % (ref 11.5–15.5)
WBC: 6.9 K/uL (ref 4.0–10.5)
nRBC: 0 % (ref 0.0–0.2)

## 2024-06-20 LAB — COMPREHENSIVE METABOLIC PANEL WITH GFR
ALT: 15 IU/L (ref 0–44)
ALT: 18 U/L (ref 0–44)
AST: 23 IU/L (ref 0–40)
AST: 24 U/L (ref 15–41)
Albumin: 4.6 g/dL (ref 3.9–4.9)
Albumin: 4.8 g/dL (ref 3.5–5.0)
Alkaline Phosphatase: 84 IU/L (ref 47–123)
Alkaline Phosphatase: 84 U/L (ref 38–126)
Anion gap: 10 (ref 5–15)
BUN/Creatinine Ratio: 12 (ref 10–24)
BUN: 12 mg/dL (ref 8–27)
BUN: 12 mg/dL (ref 8–23)
Bilirubin Total: 0.3 mg/dL (ref 0.0–1.2)
CO2: 23 mmol/L (ref 20–29)
CO2: 27 mmol/L (ref 22–32)
Calcium: 9.6 mg/dL (ref 8.6–10.2)
Calcium: 9.1 mg/dL (ref 8.9–10.3)
Chloride: 100 mmol/L (ref 96–106)
Chloride: 102 mmol/L (ref 98–111)
Creatinine, Ser: 0.99 mg/dL (ref 0.76–1.27)
Creatinine, Ser: 0.88 mg/dL (ref 0.61–1.24)
GFR, Estimated: >60 mL/min (ref >60)
Globulin, Total: 2.4 g/dL (ref 1.5–4.5)
Glucose, Bld: 109 mg/dL — ABNORMAL HIGH (ref 70–99)
Glucose: 88 mg/dL (ref 70–99)
Potassium: 4.5 mmol/L (ref 3.5–5.2)
Potassium: 4.2 mmol/L (ref 3.5–5.1)
Sodium: 137 mmol/L (ref 134–144)
Sodium: 139 mmol/L (ref 135–145)
Total Bilirubin: 0.4 mg/dL (ref 0.0–1.2)
Total Protein: 7 g/dL (ref 6.0–8.5)
Total Protein: 7.4 g/dL (ref 6.5–8.1)
eGFR: 83 mL/min/1.73 (ref >59)

## 2024-06-20 LAB — HIV ANTIBODY (ROUTINE TESTING W REFLEX): HIV Screen 4th Generation wRfx: NONREACTIVE

## 2024-06-20 LAB — TROPONIN T, HIGH SENSITIVITY
Troponin T High Sensitivity: <15 ng/L (ref 0–19)
Troponin T High Sensitivity: <15 ng/L (ref 0–19)

## 2024-06-20 LAB — HEPATITIS B SURFACE ANTIGEN: Hepatitis B Surface Ag: NEGATIVE

## 2024-06-20 LAB — HEPATITIS C ANTIBODY: Hep C Virus Ab: NONREACTIVE

## 2024-06-20 NOTE — ED Triage Notes (Signed)
 Pt arrived via POV c/o intermittent CP and SOB that radiates to his back. Pt also c/o recurring right shoulder pain.

## 2024-06-20 NOTE — Discharge Instructions (Addendum)
  You have been provided a referral to cardiology.  Someone from their office will likely call you to arrange a follow-up appointment.  You may also follow-up with your orthopedic provider regarding your shoulder pain.  Please return to the emergency department for any new or worsening symptoms.

## 2024-06-20 NOTE — ED Provider Notes (Signed)
  EMERGENCY DEPARTMENT AT [facility] Provider Note   CSN: [id number] Arrival date & time: 06/20/24  9142     Patient presents with: Chest Pain   [name] is a 68 y.o. male.    Chest Pain      [name] is a 68 y.o. male past medical history of hypercholesterolemia, anxiety, arthritis, GERD who presents to the Emergency Department complaining of waxing and waning chest pain for months he describes a aching pressure type pain to the middle to the right side of his chest that has been occurring for months.  Pain does not radiate.  He feels the pain is associated with pain of his right shoulder that he has been dealing with for some time he has also been having some shortness of breath with exertion.  He denies any arm neck or jaw pain.  No cough fever or chills.  Prior to Admission medications   Medication Sig Start Date End Date Taking? Authorizing Provider  atorvastatin  (LIPITOR) 40 MG tablet Take 40 mg by mouth at bedtime.     [provider]  emtricitabine-tenofovir AF (DESCOVY ) 200-25 MG tablet Take 1 tablet by mouth daily. Take 1 tablet by mouth daily for 28 days. 06/19/24   Leath-Warren, Etta PARAS, NP  escitalopram  (LEXAPRO ) 20 MG tablet Take 20 mg by mouth every evening.     [provider]  pantoprazole  (PROTONIX ) 40 MG tablet Take 1 tablet (40 mg total) by mouth daily. 05/14/24 05/14/25  Carver, Charles K, DO  phentermine 37.5 MG capsule Take 37.5 mg by mouth every morning.    [provider]    Allergies: Codeine and Meloxicam     Review of Systems  Cardiovascular:  Positive for chest pain.  All other systems reviewed and are negative.   Updated Vital Signs Ht 5' 10 (1.778 m)   Wt 100.4 kg   BMI 31.76 kg/m   Physical Exam Vitals and nursing note reviewed.  Constitutional:      General: He is not in acute distress.    Appearance: Normal appearance. He is well-developed. He is not toxic-appearing.   HENT:     Mouth/Throat:     Mouth: Mucous membranes are moist.     Pharynx: Oropharynx is clear.  Cardiovascular:     Rate and Rhythm: Normal rate and regular rhythm.     Pulses: Normal pulses.  Pulmonary:     Effort: Pulmonary effort is normal.     Breath sounds: Normal breath sounds.  Chest:     Chest wall: No tenderness.  Abdominal:     Palpations: Abdomen is soft.     Tenderness: There is no abdominal tenderness.  Musculoskeletal:     Right lower leg: No edema.     Left lower leg: No edema.  Skin:    General: Skin is warm.  Neurological:     General: No focal deficit present.     Mental Status: He is alert.     Sensory: No sensory deficit.     Motor: No weakness.     (all labs ordered are listed, but only abnormal results are displayed) Labs Reviewed  COMPREHENSIVE METABOLIC PANEL WITH GFR - Abnormal; Notable for the following components:      Result Value   Glucose, Bld 109 (*)    All other components within normal limits  CBC WITH DIFFERENTIAL/PLATELET  TROPONIN T, HIGH SENSITIVITY  TROPONIN T, HIGH SENSITIVITY    EKG: EKG Interpretation Date/Time:  Wednesday June 20 2024 09:07:26 EST Ventricular Rate:  86 PR Interval:  162 QRS Duration:  91 QT Interval:  365 QTC Calculation: 437 R Axis:   35  Text Interpretation: Sinus rhythm Ventricular premature complex Confirmed by Yolande Charleston [phone]) on 06/20/2024 9:21:38 AM  Radiology: DG Chest Portable 1 View Result Date: 06/20/2024 EXAM: 1 VIEW(S) XRAY OF THE CHEST 06/20/2024 09:34:38 AM COMPARISON: 07/08/2019 CLINICAL HISTORY: FINDINGS: LUNGS AND PLEURA: No focal pulmonary opacity. No pleural effusion. No pneumothorax. HEART AND MEDIASTINUM: No acute abnormality of the cardiac and mediastinal silhouettes. BONES AND SOFT TISSUES: No acute osseous abnormality. IMPRESSION: 1. No acute cardiopulmonary process. Electronically signed by: Franky Crease MD 06/20/2024 09:58 AM EST RP Workstation: [id number]      Procedures   HEART score 3  Medications Ordered in the ED - No data to display                                  Medical Decision Making   Patient here for evaluation of intermittent chest pain that have been persistent for months.  Pain has been associated with some shortness of breath and he describes pain as central to right chest.  He had 1 episode of chest pain prior to ER arrival that lasted approximately 5 minute.  Then spontaneously resolved.  He has been chest pain-free during his ER course.  Patient does have some risk factors. He notes a remote history of what he believes was a stress test but no recent cardiac follow-up.  Amount and/or Complexity of Data Reviewed Labs: ordered.    Details: Labs unremarkable.  Delta trop reassuring Radiology: ordered.    Details: Cxr w/o acute process ECG/medicine tests: ordered.    Details: EKG shows sinus rhythm with PVC Discussion of management or test interpretation with external provider(s):   Patient remains chest pain free.  He has a low heart score today and his delta troponin is reassuring.  I feel that he would benefit from close outpatient cardiology follow-up.  Will provide ambulatory referral.  His ongoing right shoulder pain without recent injury.  He has seen orthopedics for this in the past.  Likely chronic.  Agrees to arrange follow-up.  He is given strict ER return precautions.         Final diagnoses:  Nonspecific chest pain    ED Discharge Orders     None          Herlinda Milling, PA-C 06/23/24 1149    Yolande Charleston BROCKS, MD 06/26/24 [phone]

## 2024-06-29 ENCOUNTER — Encounter: Payer: Self-pay | Admitting: Orthopedic Surgery

## 2024-06-29 ENCOUNTER — Ambulatory Visit: Admitting: Orthopedic Surgery

## 2024-06-29 ENCOUNTER — Other Ambulatory Visit

## 2024-06-29 DIAGNOSIS — M25511 Pain in right shoulder: Secondary | ICD-10-CM | POA: Diagnosis not present

## 2024-06-29 DIAGNOSIS — G8929 Other chronic pain: Secondary | ICD-10-CM

## 2024-06-29 DIAGNOSIS — M75101 Unspecified rotator cuff tear or rupture of right shoulder, not specified as traumatic: Secondary | ICD-10-CM | POA: Diagnosis not present

## 2024-06-29 NOTE — Patient Instructions (Addendum)
 While we are working on your approval for MRI please go ahead and call to schedule your appointment with [facility] within at least one (1) week.   Central Scheduling [phone]  Recommendations for pain in your shoulder   Tylenol  500 mg every 6 hours  Ibuprofen  800 mg every 8 hours   Liniment 2-3 times a day  - You can use Bengay, Aspercreme, Voltaren gel, Biofreeze

## 2024-06-29 NOTE — Progress Notes (Signed)
° ° °  Intake history:  Chief Complaint  Patient presents with   Shoulder Pain    Right      Ht 5' 10 (1.778 m)   Wt 221 lb (100.2 kg)   BMI 31.71 kg/m  Body mass index is 31.71 kg/m.  Pharmacy? ______[facility]________________________________  WHAT ARE WE SEEING YOU FOR TODAY?   Right shoulder   How long has this bothered you? (DOI?DOS?WS?)  Months / several   Was there an injury? No  Anticoag.  No   Any ALLERGIES _______      Allergen Reactions   Codeine Other (See Comments)      Out of right state of mind.    Meloxicam  Itching   _______________________________________   Treatment:  Have you taken:  Tylenol  No  Advil  Yes  Had PT No  Had injection Yes but didn't help much   Other  _________________________

## 2024-06-29 NOTE — Progress Notes (Signed)
° °  Patient: [name]           Date of Birth: 1956/02/14           MRN: [id number] Visit Date: 06/29/2024 Requested by: Sheryle Carwin, MD [address] [place],  [place] [address] PCP: Sheryle Carwin, MD  Encounter Diagnosis  Name Primary?   Nontraumatic tear of right rotator cuff, unspecified tear extent Yes    Assessment and plan:  68 year old male status post shoulder surgery unclear what the procedure was x-rays show bone changes that appear to be a biceps tenodesis  He did not improve after injection  Recommend MRI of the shoulder  No orders of the defined types were placed in this encounter.    Chief Complaint  Patient presents with   Shoulder Pain    Right     History:  68 year old male who had a right shoulder arthroscopy in 2019 by Dr. Barbarann comes in with continued pain in his right shoulder x 5 months and weakness, he has had 1 cortisone injection; he did not get good pain relief from that  Focused exam findings:  He has good strength in abduction and flexion but he has poor strength on deceleration from an elevated position he can flex his arm up to 125 degrees in the scapular plane but uses a modified flexion by flexing his elbow and then reaching up above his head    DG Shoulder Right Result Date: 06/29/2024 Image report right shoulder Patient planes of right shoulder pain and weakness Previous surgery on his right shoulder no op notes to compare or review On the x-ray it looks like the glenohumeral joint is normal it looks like he may have had a biceps tenodesis with the screws there is a radiolucency in the proximal humerus and there is mention of labral repair in his note No glenohumeral arthritis the [facility] joint has some mild degenerative changes Impression postsurgical changes consistent with biceps tenodesis normal glenohumeral joint further imaging may be necessary to delineate pathology

## 2024-06-29 NOTE — Addendum Note (Signed)
 Addended byBETHA JENEAN GREIG LELON on: 06/29/2024 09:24 AM   Modules accepted: Orders

## 2024-07-02 ENCOUNTER — Other Ambulatory Visit: Payer: Self-pay | Admitting: Internal Medicine

## 2024-07-02 ENCOUNTER — Ambulatory Visit: Payer: Self-pay | Admitting: Internal Medicine

## 2024-07-02 MED ORDER — BISMUTH 262 MG PO CHEW: 2.0000 | CHEWABLE_TABLET | Freq: Four times a day (QID) | ORAL | 0 refills | Status: AC

## 2024-07-02 MED ORDER — METRONIDAZOLE 500 MG PO TABS: 500.0000 mg | ORAL_TABLET | Freq: Three times a day (TID) | ORAL | 0 refills | Status: AC

## 2024-07-02 MED ORDER — PANTOPRAZOLE SODIUM 40 MG PO TBEC: 40.0000 mg | DELAYED_RELEASE_TABLET | Freq: Two times a day (BID) | ORAL | 11 refills | Status: AC

## 2024-07-02 MED ORDER — DOXYCYCLINE MONOHYDRATE 100 MG PO TABS: 100.0000 mg | ORAL_TABLET | Freq: Two times a day (BID) | ORAL | 0 refills | Status: AC

## 2024-07-03 ENCOUNTER — Other Ambulatory Visit (HOSPITAL_COMMUNITY): Payer: Self-pay | Admitting: Internal Medicine

## 2024-07-03 DIAGNOSIS — R109 Unspecified abdominal pain: Secondary | ICD-10-CM

## 2024-07-04 ENCOUNTER — Ambulatory Visit (HOSPITAL_COMMUNITY)
Admission: RE | Admit: 2024-07-04 | Discharge: 2024-07-04 | Attending: Orthopedic Surgery | Admitting: Orthopedic Surgery

## 2024-07-04 DIAGNOSIS — M75101 Unspecified rotator cuff tear or rupture of right shoulder, not specified as traumatic: Secondary | ICD-10-CM | POA: Diagnosis present

## 2024-07-06 ENCOUNTER — Ambulatory Visit: Admitting: Cardiovascular Disease

## 2024-07-11 ENCOUNTER — Encounter: Payer: Self-pay | Admitting: Internal Medicine

## 2024-07-13 ENCOUNTER — Ambulatory Visit (HOSPITAL_COMMUNITY)
Admission: RE | Admit: 2024-07-13 | Discharge: 2024-07-13 | Disposition: A | Discharge status: Home / Self Care | Source: Ambulatory Visit | Attending: Internal Medicine | Admitting: Internal Medicine

## 2024-07-13 DIAGNOSIS — R109 Unspecified abdominal pain: Secondary | ICD-10-CM | POA: Insufficient documentation

## 2024-07-16 ENCOUNTER — Ambulatory Visit: Admitting: Orthopedic Surgery

## 2024-07-16 ENCOUNTER — Encounter: Payer: Self-pay | Admitting: Orthopedic Surgery

## 2024-07-16 DIAGNOSIS — M25511 Pain in right shoulder: Secondary | ICD-10-CM

## 2024-07-16 DIAGNOSIS — M7581 Other shoulder lesions, right shoulder: Secondary | ICD-10-CM

## 2024-07-16 DIAGNOSIS — G8929 Other chronic pain: Secondary | ICD-10-CM | POA: Diagnosis not present

## 2024-07-16 NOTE — Progress Notes (Signed)
" ° ° °  07/16/2024   Chief Complaint  Patient presents with   Results    Review MRI right shoulder     No diagnosis found.  [facility]  DOI/DOS/ Date:    Did you get better, worse or no change (Answer below)         "

## 2024-07-16 NOTE — Patient Instructions (Signed)
 Physical therapy has been ordered for you at [facility]. They should call you to schedule, [phone] is the phone number to call, if you want to call to schedule.

## 2024-07-16 NOTE — Progress Notes (Signed)
" ° °  Patient: [name]           Date of Birth: 09-26-1955           MRN: [id number] Visit Date: 07/16/2024 Requested by: Sheryle Carwin, MD [address] [place],  [place] [address] PCP: Sheryle Carwin, MD  Encounter Diagnoses  Name Primary?   Tendonitis of shoulder, right Yes   Chronic right shoulder pain     Assessment and plan:  This is a 68 year old male with previous biceps tenodesis with right shoulder pain but good strength in his rotator cuff but painful forward elevation  Recommend physical therapy first to see if he can get improvement in his pain from that he can take ibuprofen  and Tylenol  and come back in 2 months  The cuff tear is partial and his clinical exam shows he has good strength so repairing the partial tear would not improve his strength    No orders of the defined types were placed in this encounter.    Chief Complaint  Patient presents with   Results    Review MRI right shoulder     History:  History:   68 year old male who had a right shoulder arthroscopy in 2019 by Dr. Barbarann comes in with continued pain in his right shoulder x 5 months and weakness, he has had 1 cortisone injection; he did not get good pain relief from that   Focused exam findings:   He has good strength in abduction and flexion but he has poor strength on deceleration from an elevated position he can flex his arm up to 125 degrees in the scapular plane but uses a modified flexion by flexing his elbow and then reaching up above his head       DG Shoulder Right Result Date: 06/29/2024 Image report right shoulder Patient planes of right shoulder pain and weakness Previous surgery on his right shoulder no op notes to compare or review On the x-ray it looks like the glenohumeral joint is normal it looks like he may have had a biceps tenodesis with the screws there is a radiolucency in the proximal humerus and there is mention of labral repair in his note No glenohumeral  arthritis the [facility] joint has some mild degenerative changes Impression postsurgical changes consistent with biceps tenodesis normal glenohumeral joint further imaging may be necessary to delineate pathology     Imaging outside imaging MRI of the right shoulder shows a partial rotator cuff tear  "

## 2024-07-25 ENCOUNTER — Encounter: Payer: Self-pay | Admitting: Cardiology

## 2024-07-25 ENCOUNTER — Ambulatory Visit: Attending: Cardiology | Admitting: Cardiology

## 2024-07-25 VITALS — BP 150/80 | HR 67 | Ht 70.0 in | Wt 230.0 lb

## 2024-07-25 DIAGNOSIS — R079 Chest pain, unspecified: Secondary | ICD-10-CM | POA: Diagnosis not present

## 2024-07-25 MED ORDER — METOPROLOL TARTRATE 100 MG PO TABS: ORAL_TABLET | ORAL | 0 refills | Status: DC

## 2024-07-25 NOTE — Patient Instructions (Addendum)
 Medication Instructions:  Your physician recommends that you continue on your current medications as directed. Please refer to the Current Medication list given to you today.  *If you need a refill on your cardiac medications before your next appointment, please call your pharmacy*  Lab Work: BMET  If you have labs (blood work) drawn today and your tests are completely normal, you will receive your results only by: MyChart Message (if you have MyChart) OR A paper copy in the mail If you have any lab test that is abnormal or we need to change your treatment, we will call you to review the results.  Testing/Procedures: Coronary CT  Follow-Up: At [facility], you and your health needs are our priority.  As part of our continuing mission to provide you with exceptional heart care, our providers are all part of one team.  This team includes your primary Cardiologist (physician) and Advanced Practice Providers or APPs (Physician Assistants and Nurse Practitioners) who all work together to provide you with the care you need, when you need it.  Your next appointment:    To Be Determined   Provider:   You may see Peter Ross, MD or one of the following Advanced Practice Providers on your designated Care Team:   Peter Qua, PA-C  [name], [place] [name], [place]     We recommend signing up for the patient portal called MyChart.  Sign up information is provided on this After Visit Summary.  MyChart is used to connect with patients for Virtual Visits (Telemedicine).  Patients are able to view lab/test results, encounter notes, upcoming appointments, etc.  Non-urgent messages can be sent to your provider as well.   To learn more about what you can do with MyChart, go to [email or web address].   Other Instructions       [name]. [name] [address]  [place], [place] [address]   If scheduled at the [facility] at  [address], please enter the parking lot using the [address] entrance and use the FREE valet service at the patient drop-off area. Enter the building and check-in with registration on the main floor.   Please follow these instructions carefully (unless otherwise directed):  An IV will be required for this test and Nitroglycerin will be given.  Hold all erectile dysfunction medications at least 3 days (72 hrs) prior to test. (Ie viagra, cialis, sildenafil, tadalafil, etc)   On the Night Before the Test: Be sure to Drink plenty of water. Do not consume any caffeinated/decaffeinated beverages or chocolate 12 hours prior to your test. Do not take any antihistamines 12 hours prior to your test.   On the Day of the Test: Drink plenty of water until 1 hour prior to the test. Do not eat any food 1 hour prior to test. You may take your regular medications prior to the test.  Take metoprolol  (Lopressor ) two hours prior to test. If you take Furosemide/Hydrochlorothiazide/Spironolactone/Chlorthalidone, please HOLD on the morning of the test. Patients who wear a continuous glucose monitor MUST remove the device prior to scanning.  After the Test: Drink plenty of water. After receiving IV contrast, you may experience a mild flushed feeling. This is normal. On occasion, you may experience a mild rash up to 24 hours after the test. This is not dangerous. If this occurs, you can take Benadryl  25 mg, Zyrtec, Claritin, or Allegra and increase your fluid intake. (Patients taking Tikosyn should avoid Benadryl , and may  take Zyrtec, Claritin, or Allegra) If you experience trouble breathing, this can be serious. If it is severe call 911 IMMEDIATELY. If it is mild, please call our office.  We will call to schedule your test 2-4 weeks out understanding that some insurance companies will need an authorization prior to the service being performed.   For more information and frequently asked questions,  please visit our website : [email or web address]  For non-scheduling related questions, please contact the cardiac imaging nurse navigator should you have any questions/concerns: Cardiac Imaging Nurse Navigators Direct Office Dial: [phone]   For scheduling needs, including cancellations and rescheduling, please call Brittany, [phone].    Cooking With Less Salt Cooking with less salt is one way to reduce the amount of salt (sodium) you get from food. Most people should have less than 2,300 milligrams (mg) of sodium each day. If you have high blood pressure (hypertension), you may need to limit your sodium to 1,500 mg each day. Follow the tips below to help reduce your sodium intake. What are tips for eating less sodium? Reading food labels  Check the food label before buying or using packaged ingredients. Always check the label for the serving size and sodium content. Choose products with less than 140 mg of sodium per serving. Check the % Daily Value column to see what percent of the daily recommended amount of sodium is in one serving of the product. Foods with 5% or less are low in sodium. Foods with 20% or more are high in sodium. Do not choose foods that have salt as one of the first three ingredients on the ingredients list. Always check how much sodium is in a product, even if the label says unsalted or no salt added. Shopping Buy sodium-free or low-sodium products. Look for these words: Low-sodium. Sodium-free. Reduced-sodium. No salt added. Unsalted. Buy fresh or frozen foods without sauces or additives. Cooking Instead of salt, use herbs, seasonings without salt, and spices. Use sodium-free baking soda. Grill, braise, or roast foods to add flavor with less salt. Do not add salt to pasta, rice, or hot cereals. Drain and rinse canned vegetables, beans, and meat before use. Do not add salt when cooking sweets and desserts. Cook with low-sodium  ingredients. Meal planning The sodium in bread can add up. Try to plan meals with other grains. These may include whole oats, quinoa, whole wheat pasta, and other whole grains that do not have sodium added to them. What foods are high in sodium? Vegetables Regular canned vegetables, except low-sodium or reduced-sodium items. Sauerkraut, pickled vegetables, and relishes. Olives. French fries. Onion rings. Regular canned tomato sauce and paste. Regular tomato and vegetable juice. Frozen vegetables in sauces. Grains Instant hot cereals. Bread stuffing, pancake, and biscuit mixes. Croutons. Seasoned rice or pasta mixes. Noodle soup cups. Boxed or frozen macaroni and cheese. Regular salted crackers. Self-rising flour. Rolls. Bagels. Flour tortillas and wraps. Meats and other proteins Meat or fish that is salted, canned, smoked, cured, spiced, or pickled. Precooked or cured meat, such as sausages or meat loaves. [place]. Ham. Pepperoni. Hot dogs. Corned beef. Chipped beef. Salt pork. Jerky. Pickled herring, anchovies, and sardines. Regular canned tuna. Salted nuts. Dairy Processed cheese and cheese spreads. Hard cheeses. Cheese curds. Blue cheese. Feta cheese. String cheese. Regular cottage cheese. Buttermilk. Canned milk. The items listed above may not be a full list of foods high in sodium. Talk to a dietitian to learn more. What foods are low in sodium? Fruits Fresh, frozen, or canned  fruit with no sauce added. Fruit juice. Vegetables Fresh or frozen vegetables with no sauce added. No salt added canned vegetables. No salt added tomato sauce and paste. Low-sodium or reduced-sodium tomato and vegetable juice. Grains Noodles, pasta, quinoa, rice. Shredded or puffed wheat or puffed rice. Regular or quick oats (not instant). Low-sodium crackers. Low-sodium bread. Whole grain bread and whole grain pasta. Unsalted popcorn. Meats and other proteins Fresh or frozen whole meats, poultry that has not been  injected with sodium, and fish with no sauce added. Unsalted nuts. Dried peas, beans, and lentils without added salt. Unsalted canned beans. Eggs. Unsalted nut butters. Low-sodium canned tuna or chicken. Dairy Milk. Soy milk. Yogurt. Low-sodium cheeses, such as Swiss, Monterey Jack, [place], and ricotta. Sherbet or ice cream (keep to  cup per serving). Cream cheese. Fats and oils Unsalted butter or margarine. Other foods Homemade pudding. Sodium-free baking soda and baking powder. Herbs and spices. Low-sodium seasoning mixes. Beverages Coffee and tea. Carbonated beverages. The items listed above may not be a full list of foods low in sodium. Talk to a dietitian to learn more. What are some salt alternatives when cooking? Herbs, seasonings, and spices can be used instead of salt to flavor your food. Herbs should be fresh or dried. Do not choose packaged mixes. Next to the name of the herb, spice, or seasoning below are some foods you can pair it with. Herbs Bay leaves - Soups, meat and vegetable dishes, and spaghetti sauce. Basil - [employer], soups, pasta, and fish dishes. Cilantro - Meat, poultry, and vegetable dishes. Chili powder - Marinades and Mexican dishes. Chives - Salad dressings and potato dishes. Cumin - Mexican dishes, couscous, and meat dishes. Dill - Fish dishes, sauces, and salads. Fennel - Meat and vegetable dishes, breads, and cookies. Garlic (do not use garlic salt) - Italian dishes, meat dishes, salad dressings, and sauces. Marjoram - Soups, potato dishes, and meat dishes. Oregano - Pizza and spaghetti sauce. Parsley - Salads, soups, pasta, and meat dishes. Rosemary - Italian dishes, salad dressings, soups, and red meats. Saffron - Fish dishes, pasta, and some poultry dishes. Sage - Stuffings and sauces. Tarragon - Fish and whole foods. Thyme - Stuffing, meat, and fish dishes. Seasonings Lemon juice - Fish dishes, poultry dishes, vegetables, and  salads. Vinegar - Salad dressings, vegetables, and fish dishes. Spices Cinnamon - Sweet dishes, such as cakes, cookies, and puddings. Cloves - Gingerbread, puddings, and marinades for meats. Curry - Vegetable dishes, fish and poultry dishes, and stir-fry dishes. Ginger - Vegetable dishes, fish dishes, and stir-fry dishes. Nutmeg - Pasta, vegetables, poultry, fish dishes, and custard. This information is not intended to replace advice given to you by your health care provider. Make sure you discuss any questions you have with your health care provider. Document Revised: 07/29/2022 Document Reviewed: 07/22/2022 Elsevier Patient Education  2024 [employer].

## 2024-07-25 NOTE — Progress Notes (Signed)
 "     Clinical Summary [name] is a 69 y.o.male seen today as a new consult for the following medical problems.   1.Chest pain - ER visit 06/20/24 with chest pain - trops negative, EKG no ischemic changes  - symptoms started about 6 months ago - midchest, radiates between shoulder blades, radiates down both arms. . Squeezing like pain, at its worst 10/10 in severity. Can occur at rest or with activity. +SOB, get nauseous and can vomit. Pain lasts 1-4 minutes. Symptoms occur daily, mild episodes daily and strong episodes 2-3 times over a 2 week period.  - episode this AM while sweeping floor this morning.  - increasing in frequency  CAD risk factors: HLD, remote tobacco history x 14 years quit in the 1980s. Father with several MIs starting in his 32s, 2 sisters with MI he is not sure of age.      Past Medical History:  Diagnosis Date   Anxiety    some panic attacks- off medications   Arthritis    Complication of anesthesia    woke up during surgery- Elbow surgery, 2009 and again with foot surgeryry    Depression    GERD (gastroesophageal reflux disease)    once in awhile   Hypercholesteremia      Allergies[1]   Current Outpatient Medications  Medication Sig Dispense Refill   ALPRAZolam  (NIRAVAM ) 0.25 MG dissolvable tablet Take 0.25 mg by mouth at bedtime as needed for anxiety.     atorvastatin  (LIPITOR) 40 MG tablet Take 40 mg by mouth at bedtime.      escitalopram  (LEXAPRO ) 20 MG tablet Take 20 mg by mouth every evening.      metoprolol  tartrate (LOPRESSOR ) 100 MG tablet Take 1 tablet by mouth 2 hours prior to CT Scan 1 tablet 0   nitroGLYCERIN (NITROSTAT) 0.4 MG SL tablet Place 0.4 mg under the tongue every 5 (five) minutes as needed for chest pain.     pantoprazole  (PROTONIX ) 40 MG tablet Take 1 tablet (40 mg total) by mouth 2 (two) times daily. 60 tablet 11   phentermine 37.5 MG capsule Take 37.5 mg by mouth every morning. (Patient not taking: Reported on 07/25/2024)      No current facility-administered medications for this visit.     Past Surgical History:  Procedure Laterality Date   ANTERIOR CERVICAL DECOMP/DISCECTOMY FUSION N/A 12/21/2017   Procedure: C3-4 ANTERIOR CERVICAL DECOMPRESSION/DISCECTOMY FUSION, ALLOGRAFT, PLATE;  Surgeon: Barbarann Oneil BROCKS, MD;  Location: [facility] OR;  Service: Orthopedics;  Laterality: N/A;   CATARACT EXTRACTION Right    CATARACT EXTRACTION Left    COLONOSCOPY  2008   Dr. Harvey: Hyperplastic polyp removed, diverticulosis.   COLONOSCOPY WITH PROPOFOL  N/A 07/11/2018   Procedure: COLONOSCOPY WITH PROPOFOL ;  Surgeon: Harvey Margo CROME, MD;  Location: [facility];  Service: Endoscopy;  Laterality: N/A;  7:30am   ESOPHAGEAL DILATION N/A 05/14/2024   Procedure: DILATION, ESOPHAGUS;  Surgeon: Cindie Carlin POUR, DO;  Location: [facility];  Service: Endoscopy;  Laterality: N/A;   ESOPHAGOGASTRODUODENOSCOPY N/A 05/14/2024   Procedure: EGD (ESOPHAGOGASTRODUODENOSCOPY);  Surgeon: Cindie Carlin POUR, DO;  Location: [facility];  Service: Endoscopy;  Laterality: N/A;  10:00 am, asa 2   FOOT SURGERY     left elbow      LUMBAR LAMINECTOMY     back in 2015   MASS EXCISION Left 09/12/2019   Procedure: EXCISION GOUTY TOPHI;  Surgeon: Blinda Katz, DPM;  Location: [facility];  Service: Podiatry;  Laterality:  Left;   POLYPECTOMY  07/11/2018   Procedure: POLYPECTOMY;  Surgeon: Harvey Margo CROME, MD;  Location: [facility];  Service: Endoscopy;;  colon   TOTAL KNEE ARTHROPLASTY Left 11/11/2020   Procedure: LEFT TOTAL KNEE ARTHROPLASTY;  Surgeon: Margrette Taft BRAVO, MD;  Location: [facility];  Service: Orthopedics;  Laterality: Left;   TOTAL KNEE ARTHROPLASTY Right 04/12/2023   Procedure: TOTAL KNEE ARTHROPLASTY;  Surgeon: Margrette Taft BRAVO, MD;  Location: [facility];  Service: Orthopedics;  Laterality: Right;     Allergies[2]    Family History  Problem Relation Age of Onset   Heart disease Mother    Heart disease Father    Other Sister         esophagus stretched   Lung cancer Sister    Lung cancer Brother    Brain cancer Paternal Uncle    Other Cousin        colostomy     Social History [name] reports that he has quit smoking. His smoking use included cigarettes. He has been exposed to tobacco smoke. He has never used smokeless tobacco. [name] reports no history of alcohol use.   Physical Examination Today's Vitals   07/25/24 1029 07/25/24 1106  BP: (!) 146/70 (!) 150/80  Pulse: 67   SpO2: 95%   Weight: 230 lb (104.3 kg)   Height: 5' 10 (1.778 m)    Body mass index is 33 kg/m.  Gen: resting comfortably, no acute distress HEENT: no scleral icterus, pupils equal round and reactive, no palptable cervical adenopathy,  CV: RRR, no m/rg, no jvd Resp: Clear to auscultation bilaterally GI: abdomen is soft, non-tender, non-distended, normal bowel sounds, no hepatosplenomegaly MSK: extremities are warm, no edema.  Skin: warm, no rash Neuro:  no focal deficits Psych: appropriate affect     Assessment and Plan  1.Chest pain - unclear etiology - has CAD risk factors, will plan for coronary CTA to further evaluate  2. Elevated blood pressure - bp elevated today, at pcp visit SBP was in the 130s - monitor at this time, discussed DASH diety and lifestyle modification. If persistently elevated will need to start medical therapy.    F/u pending coronary CTA results   Dorn PHEBE Ross, M.D.     [1]  Allergies Allergen Reactions   Codeine Other (See Comments)    Out of right state of mind.    Meloxicam  Itching  [2]  Allergies Allergen Reactions   Codeine Other (See Comments)    Out of right state of mind.    Meloxicam  Itching   "

## 2024-08-03 ENCOUNTER — Telehealth (HOSPITAL_COMMUNITY): Payer: Self-pay | Admitting: *Deleted

## 2024-08-03 NOTE — Telephone Encounter (Signed)
 Attempted to call patient regarding upcoming cardiac CT appointment. Left message on voicemail with name and callback number Sid Seats RN Navigator Cardiac Imaging [facility] [phone] Office

## 2024-08-06 ENCOUNTER — Ambulatory Visit (HOSPITAL_COMMUNITY)
Admission: RE | Admit: 2024-08-06 | Discharge: 2024-08-06 | Disposition: A | Discharge status: Home / Self Care | Source: Ambulatory Visit | Attending: Internal Medicine | Admitting: Internal Medicine

## 2024-08-06 ENCOUNTER — Ambulatory Visit (HOSPITAL_COMMUNITY)
Admission: RE | Admit: 2024-08-06 | Discharge: 2024-08-06 | Disposition: A | Source: Ambulatory Visit | Attending: Cardiology

## 2024-08-06 ENCOUNTER — Ambulatory Visit: Payer: Self-pay | Admitting: Cardiology

## 2024-08-06 ENCOUNTER — Other Ambulatory Visit: Payer: Self-pay | Admitting: Cardiology

## 2024-08-06 DIAGNOSIS — J439 Emphysema, unspecified: Secondary | ICD-10-CM | POA: Diagnosis not present

## 2024-08-06 DIAGNOSIS — I700 Atherosclerosis of aorta: Secondary | ICD-10-CM | POA: Diagnosis not present

## 2024-08-06 DIAGNOSIS — I281 Aneurysm of pulmonary artery: Secondary | ICD-10-CM | POA: Diagnosis not present

## 2024-08-06 DIAGNOSIS — R079 Chest pain, unspecified: Secondary | ICD-10-CM

## 2024-08-06 DIAGNOSIS — R931 Abnormal findings on diagnostic imaging of heart and coronary circulation: Secondary | ICD-10-CM | POA: Diagnosis not present

## 2024-08-06 DIAGNOSIS — R911 Solitary pulmonary nodule: Secondary | ICD-10-CM | POA: Diagnosis not present

## 2024-08-06 DIAGNOSIS — I2510 Atherosclerotic heart disease of native coronary artery without angina pectoris: Secondary | ICD-10-CM | POA: Insufficient documentation

## 2024-08-06 MED ORDER — IOHEXOL 350 MG/ML SOLN
100.0000 mL | Freq: Once | INTRAVENOUS | Status: AC | PRN
  Administered 2024-08-06: 100 mL via INTRAVENOUS

## 2024-08-06 MED ORDER — NITROGLYCERIN 0.4 MG SL SUBL
0.8000 mg | SUBLINGUAL_TABLET | Freq: Once | SUBLINGUAL | Status: AC
  Administered 2024-08-06: 0.8 mg via SUBLINGUAL

## 2024-08-07 ENCOUNTER — Telehealth: Payer: Self-pay

## 2024-08-07 ENCOUNTER — Other Ambulatory Visit: Payer: Self-pay | Admitting: Cardiology

## 2024-08-07 ENCOUNTER — Encounter: Payer: Self-pay | Admitting: Cardiology

## 2024-08-07 ENCOUNTER — Ambulatory Visit: Attending: Cardiology | Admitting: Cardiology

## 2024-08-07 VITALS — Ht 70.0 in | Wt 230.0 lb

## 2024-08-07 DIAGNOSIS — R0789 Other chest pain: Secondary | ICD-10-CM | POA: Diagnosis not present

## 2024-08-07 NOTE — Patient Instructions (Signed)
 Medication Instructions:  Your physician recommends that you continue on your current medications as directed. Please refer to the Current Medication list given to you today.  *If you need a refill on your cardiac medications before your next appointment, please call your pharmacy*  Lab Work: BMET CBC  If you have labs (blood work) drawn today and your tests are completely normal, you will receive your results only by: MyChart Message (if you have MyChart) OR A paper copy in the mail If you have any lab test that is abnormal or we need to change your treatment, we will call you to review the results.  Testing/Procedures:  Left Heart Cath   Follow-Up: At [facility], you and your health needs are our priority.  As part of our continuing mission to provide you with exceptional heart care, our providers are all part of one team.  This team includes your primary Cardiologist (physician) and Advanced Practice Providers or APPs (Physician Assistants and Nurse Practitioners) who all work together to provide you with the care you need, when you need it.  Your next appointment:   1 month(s)  Provider:   You may see Dorn Ross, MD or one of the following Advanced Practice Providers on your designated Care Team:   Laymon Qua, PA-C  Sc[place]ia [place], [place] [name], [place]     We recommend signing up for the patient portal called MyChart.  Sign up information is provided on this After Visit Summary.  MyChart is used to connect with patients for Virtual Visits (Telemedicine).  Patients are able to view lab/test results, encounter notes, upcoming appointments, etc.  Non-urgent messages can be sent to your provider as well.   To learn more about what you can do with MyChart, go to [email or web address].   Other Instructions    [facility] A DEPT OF [place] [address] [place] [address] Dept:  [phone] Loc: [phone]  [name]  08/07/2024  You are scheduled for a Cardiac Catheterization on Tuesday, January 27 with Dr. Newman Lawrence.  1. Please arrive at the [facility] (Main Entrance A) at [facility]: [address] [place], [place] [address] at 7:00 AM (This time is 2 hour(s) before your procedure to ensure your preparation).   Free valet parking service is available. You will check in at ADMITTING. The support person will be asked to wait in the waiting room.  It is OK to have someone drop you off and come back when you are ready to be discharged.    Special note: Every effort is made to have your procedure done on time. Please understand that emergencies sometimes delay scheduled procedures.  2. Diet: Nothing to eat after midnight.   3. Hydration: You need to be well hydrated before your procedure. On January 27, you may drink approved liquids (see below) until 2 hours before the procedure, with 16 oz of water as your last intake.   List of approved liquids water, clear juice, clear tea, black coffee, fruit juices, non-citric and without pulp, carbonated beverages, Gatorade, Kool -Aid, plain Jello-O and plain ice popsicles.  4. Labs: You will need to have blood drawn by Friday 08/10/2024 at [facility].   5. Medication instructions in preparation for your procedure:   Contrast Allergy: No   On the morning of your procedure, take your Aspirin  81 mg and any morning medicines NOT listed above.  You  may use sips of water.  6. Plan to go home the same day, you will only stay overnight if medically necessary. 7. Bring a current list of your medications and current insurance cards. 8. You MUST have a responsible person to drive you home. 9. Someone MUST be with you the first 24 hours after you arrive home or your discharge will be delayed. 10. Please wear clothes that are easy to get on and off and wear slip-on shoes.  Thank you for allowing  us  to care for you!   -- [name]

## 2024-08-07 NOTE — Progress Notes (Signed)
 "   Virtual Visit via Telephone Note   Because of Dacen Frayre Mcgloin's co-morbid illnesses, he is at least at moderate risk for complications without adequate follow up.  This format is felt to be most appropriate for this patient at this time.  The patient did not have access to video technology/had technical difficulties with video requiring transitioning to audio format only (telephone).  All issues noted in this document were discussed and addressed.  No physical exam could be performed with this format.  Please refer to the patient's chart for his consent to telehealth for [facility].    Date:  08/07/2024   ID:  ELIASAR HLAVATY, DOB September 30, 1955, MRN [id number] The patient was identified using 2 identifiers.  Patient Location: Home Provider Location: Office/Clinic   PCP:  Sheryle Carwin, MD    HeartCare Providers Cardiologist:  Chistine Dematteo    Evaluation Performed:  Follow-Up Visit  Chief Complaint:  Chest pain  History of Present Illness:    RIPKEN REKOWSKI is a 69 y.o. male seen today for follow up of the following medical problems.   1.Chest pain - ER visit 06/20/24 with chest pain - trops negative, EKG no ischemic changes   - symptoms started about 6 months ago - midchest, radiates between shoulder blades, radiates down both arms. . Squeezing like pain, at its worst 10/10 in severity. Can occur at rest or with activity. +SOB, get nauseous and can vomit. Pain lasts 1-4 minutes. Symptoms occur daily, mild episodes daily and strong episodes 2-3 times over a 2 week period.  - episode this AM while sweeping floor this morning.  - increasing in frequency   CAD risk factors: HLD, remote tobacco history x 14 years quit in the 1980s. Father with several MIs starting in his 67s, 2 sisters with MI he is not sure of age.    - Jan 2026 coronary CTA: 70-99% proximal LAD disease significant by FFR, 50-69% proximal RCA disease significant by FFR.  - chest pains unchanged since  last visit  Past Medical History:  Diagnosis Date   Anxiety    some panic attacks- off medications   Arthritis    Complication of anesthesia    woke up during surgery- Elbow surgery, 2009 and again with foot surgeryry    Depression    GERD (gastroesophageal reflux disease)    once in awhile   Hypercholesteremia    Past Surgical History:  Procedure Laterality Date   ANTERIOR CERVICAL DECOMP/DISCECTOMY FUSION N/A 12/21/2017   Procedure: C3-4 ANTERIOR CERVICAL DECOMPRESSION/DISCECTOMY FUSION, ALLOGRAFT, PLATE;  Surgeon: Barbarann Oneil BROCKS, MD;  [facility]: [facility] OR;  Service: Orthopedics;  Laterality: N/A;   CATARACT EXTRACTION Right    CATARACT EXTRACTION Left    COLONOSCOPY  2008   Dr. Harvey: Hyperplastic polyp removed, diverticulosis.   COLONOSCOPY WITH PROPOFOL  N/A 07/11/2018   Procedure: COLONOSCOPY WITH PROPOFOL ;  Surgeon: Harvey Margo CROME, MD;  [facility];  Service: Endoscopy;  Laterality: N/A;  7:30am   ESOPHAGEAL DILATION N/A 05/14/2024   Procedure: DILATION, ESOPHAGUS;  Surgeon: Cindie Carlin POUR, DO;  [facility];  Service: Endoscopy;  Laterality: N/A;   ESOPHAGOGASTRODUODENOSCOPY N/A 05/14/2024   Procedure: EGD (ESOPHAGOGASTRODUODENOSCOPY);  Surgeon: Cindie Carlin POUR, DO;  [facility];  Service: Endoscopy;  Laterality: N/A;  10:00 am, asa 2   FOOT SURGERY     left elbow      LUMBAR LAMINECTOMY     back in 2015   MASS  EXCISION Left 09/12/2019   Procedure: EXCISION GOUTY TOPHI;  Surgeon: Blinda Katz, DPM;  Location: [facility] ORS;  Service: Podiatry;  Laterality: Left;   POLYPECTOMY  07/11/2018   Procedure: POLYPECTOMY;  Surgeon: Harvey Margo CROME, MD;  [facility];  Service: Endoscopy;;  colon   TOTAL KNEE ARTHROPLASTY Left 11/11/2020   Procedure: LEFT TOTAL KNEE ARTHROPLASTY;  Surgeon: Margrette Taft BRAVO, MD;  Location: [facility];  Service: Orthopedics;  Laterality: Left;   TOTAL KNEE ARTHROPLASTY Right 04/12/2023   Procedure: TOTAL KNEE  ARTHROPLASTY;  Surgeon: Margrette Taft BRAVO, MD;  Location: [facility];  Service: Orthopedics;  Laterality: Right;     Active Medications[1]   Allergies:   Codeine and Meloxicam    Social History[2]   Family Hx: The patient's family history includes Brain cancer in his paternal uncle; Heart disease in his father and mother; Lung cancer in his brother and sister; Other in his cousin and sister.  ROS:   Please see the history of present illness.     All other systems reviewed and are negative.     Labs/Other Tests and Data Reviewed:    EKG:  No ECG reviewed.  Recent Labs: 06/20/2024: ALT 18; BUN 12; Creatinine, Ser 0.88; Hemoglobin 14.1; Platelets 261; Potassium 4.2; Sodium 139   Recent Lipid Panel No results found for: CHOL, TRIG, HDL, CHOLHDL, LDLCALC, LDLDIRECT  Wt Readings from Last 3 Encounters:  07/25/24 230 lb (104.3 kg)  06/29/24 221 lb (100.2 kg)  06/20/24 221 lb 5.5 oz (100.4 kg)     Risk Assessment/Calculations:          Objective:    Vital Signs:   Today's Vitals   08/07/24 1310  Weight: 230 lb (104.3 kg)  Height: 5' 10 (1.778 m)   Body mass index is 33 kg/m. Normal affect. Normal speech pattern and tone. Comfortable, no apparent distress. No audible signs of SOB or wheezing.   ASSESSMENT & PLAN:    1.Chest pain - abnormal coronary CTA indicating significant proximal LAD and RCA disease - he will start ASA 81mg  daily, already on statin - plan for cath for definitive evaluation        Informed Consent   Shared Decision Making/Informed Consent The risks [stroke (1 in 1000), death (1 in 1000), kidney failure [usually temporary] (1 in 500), bleeding (1 in 200), allergic reaction [possibly serious] (1 in 200)], benefits (diagnostic support and management of coronary artery disease) and alternatives of a cardiac catheterization were discussed in detail with Mr. Cavell and he is willing to proceed.        Time:   Today, I have spent 22  minutes with the patient with telehealth technology discussing the above problems.     Medication Adjustments/Labs and Tests Ordered: Current medicines are reviewed at length with the patient today.  Concerns regarding medicines are outlined above.   Tests Ordered: No orders of the defined types were placed in this encounter.   Medication Changes: No orders of the defined types were placed in this encounter.   Follow Up:  83month  Signed, Alvan Carrier, MD  08/07/2024 12:44 PM    [facility]     [1]  No outpatient medications have been marked as taking for the 08/07/24 encounter (Appointment) with Alvan Carrier FALCON, MD.  [2]  Social History Tobacco Use   Smoking status: Former    Types: Cigarettes    Passive exposure: Past   Smokeless tobacco: Never  Vaping Use   Vaping  status: Never Used  Substance Use Topics   Alcohol use: No    Comment: he drank on weeksends in his 26s, went to rehab due to losing licence, quit all etoh when he got out (1985)cohol 1985   Drug use: No   "

## 2024-08-07 NOTE — Telephone Encounter (Signed)
"  °  Patient Consent for Virtual Visit    [name] has provided verbal consent on 08/07/2024 for a virtual visit (video or telephone).   CONSENT FOR VIRTUAL VISIT FOR:  [name]  By participating in this virtual visit I agree to the following:  I hereby voluntarily request, consent and authorize [facility] and its employed or contracted physicians, physician assistants, nurse practitioners or other licensed health care professionals (the Practitioner), to provide me with telemedicine health care services (the Services) as deemed necessary by the treating Practitioner. I acknowledge and consent to receive the Services by the Practitioner via telemedicine. I understand that the telemedicine visit will involve communicating with the Practitioner through live audiovisual communication technology and the disclosure of certain medical information by electronic transmission. I acknowledge that I have been given the opportunity to request an in-person assessment or other available alternative prior to the telemedicine visit and am voluntarily participating in the telemedicine visit.  I understand that I have the right to withhold or withdraw my consent to the use of telemedicine in the course of my care at any time, without affecting my right to future care or treatment, and that the Practitioner or I may terminate the telemedicine visit at any time. I understand that I have the right to inspect all information obtained and/or recorded in the course of the telemedicine visit and may receive copies of available information for a reasonable fee.  I understand that some of the potential risks of receiving the Services via telemedicine include:  Delay or interruption in medical evaluation due to technological equipment failure or disruption; Information transmitted may not be sufficient (e.g. poor resolution of images) to allow for appropriate medical decision making by the Practitioner;  and/or  In rare instances, security protocols could fail, causing a breach of personal health information.  Furthermore, I acknowledge that it is my responsibility to provide information about my medical history, conditions and care that is complete and accurate to the best of my ability. I acknowledge that Practitioner's advice, recommendations, and/or decision may be based on factors not within their control, such as incomplete or inaccurate data provided by me or distortions of diagnostic images or specimens that may result from electronic transmissions. I understand that the practice of medicine is not an exact science and that Practitioner makes no warranties or guarantees regarding treatment outcomes. I acknowledge that a copy of this consent can be made available to me via my patient portal [facility] MyChart), or I can request a printed copy by calling the office of [facility].    I understand that my insurance will be billed for this visit.   I have read or had this consent read to me. I understand the contents of this consent, which adequately explains the benefits and risks of the Services being provided via telemedicine.  I have been provided ample opportunity to ask questions regarding this consent and the Services and have had my questions answered to my satisfaction. I give my informed consent for the services to be provided through the use of telemedicine in my medical care    "

## 2024-08-07 NOTE — H&P (View-Only) (Signed)
 "   Virtual Visit via Telephone Note   Because of [name]'s co-morbid illnesses, he is at least at moderate risk for complications without adequate follow up.  This format is felt to be most appropriate for this patient at this time.  The patient did not have access to video technology/had technical difficulties with video requiring transitioning to audio format only (telephone).  All issues noted in this document were discussed and addressed.  No physical exam could be performed with this format.  Please refer to the patient's chart for his consent to telehealth for [facility].    Date:  08/07/2024   ID:  [name], DOB September 30, 1955, MRN [id number] The patient was identified using 2 identifiers.  Patient Location: Home Provider Location: Office/Clinic   PCP:  Sheryle Carwin, MD    HeartCare Providers Cardiologist:  Chistine Dematteo    Evaluation Performed:  Follow-Up Visit  Chief Complaint:  Chest pain  History of Present Illness:    [name] is a 69 y.o. male seen today for follow up of the following medical problems.   1.Chest pain - ER visit 06/20/24 with chest pain - trops negative, EKG no ischemic changes   - symptoms started about 6 months ago - midchest, radiates between shoulder blades, radiates down both arms. . Squeezing like pain, at its worst 10/10 in severity. Can occur at rest or with activity. +SOB, get nauseous and can vomit. Pain lasts 1-4 minutes. Symptoms occur daily, mild episodes daily and strong episodes 2-3 times over a 2 week period.  - episode this AM while sweeping floor this morning.  - increasing in frequency   CAD risk factors: HLD, remote tobacco history x 14 years quit in the 1980s. Father with several MIs starting in his 67s, 2 sisters with MI he is not sure of age.    - Jan 2026 coronary CTA: 70-99% proximal LAD disease significant by FFR, 50-69% proximal RCA disease significant by FFR.  - chest pains unchanged since  last visit  Past Medical History:  Diagnosis Date   Anxiety    some panic attacks- off medications   Arthritis    Complication of anesthesia    woke up during surgery- Elbow surgery, 2009 and again with foot surgeryry    Depression    GERD (gastroesophageal reflux disease)    once in awhile   Hypercholesteremia    Past Surgical History:  Procedure Laterality Date   ANTERIOR CERVICAL DECOMP/DISCECTOMY FUSION N/A 12/21/2017   Procedure: C3-4 ANTERIOR CERVICAL DECOMPRESSION/DISCECTOMY FUSION, ALLOGRAFT, PLATE;  Surgeon: Barbarann Oneil BROCKS, MD;  Location: [facility] OR;  Service: Orthopedics;  Laterality: N/A;   CATARACT EXTRACTION Right    CATARACT EXTRACTION Left    COLONOSCOPY  2008   Dr. Harvey: Hyperplastic polyp removed, diverticulosis.   COLONOSCOPY WITH PROPOFOL  N/A 07/11/2018   Procedure: COLONOSCOPY WITH PROPOFOL ;  Surgeon: Harvey Margo CROME, MD;  [facility];  Service: Endoscopy;  Laterality: N/A;  7:30am   ESOPHAGEAL DILATION N/A 05/14/2024   Procedure: DILATION, ESOPHAGUS;  Surgeon: Cindie Carlin POUR, DO;  [facility];  Service: Endoscopy;  Laterality: N/A;   ESOPHAGOGASTRODUODENOSCOPY N/A 05/14/2024   Procedure: EGD (ESOPHAGOGASTRODUODENOSCOPY);  Surgeon: Cindie Carlin POUR, DO;  [facility];  Service: Endoscopy;  Laterality: N/A;  10:00 am, asa 2   FOOT SURGERY     left elbow      LUMBAR LAMINECTOMY     back in 2015   MASS  EXCISION Left 09/12/2019   Procedure: EXCISION GOUTY TOPHI;  Surgeon: Blinda Katz, DPM;  Location: [facility];  Service: Podiatry;  Laterality: Left;   POLYPECTOMY  07/11/2018   Procedure: POLYPECTOMY;  Surgeon: Harvey Margo CROME, MD;  [facility];  Service: Endoscopy;;  colon   TOTAL KNEE ARTHROPLASTY Left 11/11/2020   Procedure: LEFT TOTAL KNEE ARTHROPLASTY;  Surgeon: Margrette Taft BRAVO, MD;  Location: [facility];  Service: Orthopedics;  Laterality: Left;   TOTAL KNEE ARTHROPLASTY Right 04/12/2023   Procedure: TOTAL KNEE  ARTHROPLASTY;  Surgeon: Margrette Taft BRAVO, MD;  Location: [facility];  Service: Orthopedics;  Laterality: Right;     Active Medications[1]   Allergies:   Codeine and Meloxicam    Social History[2]   Family Hx: The patient's family history includes Brain cancer in his paternal uncle; Heart disease in his father and mother; Lung cancer in his brother and sister; Other in his cousin and sister.  ROS:   Please see the history of present illness.     All other systems reviewed and are negative.     Labs/Other Tests and Data Reviewed:    EKG:  No ECG reviewed.  Recent Labs: 06/20/2024: ALT 18; BUN 12; Creatinine, Ser 0.88; Hemoglobin 14.1; Platelets 261; Potassium 4.2; Sodium 139   Recent Lipid Panel No results found for: CHOL, TRIG, HDL, CHOLHDL, LDLCALC, LDLDIRECT  Wt Readings from Last 3 Encounters:  07/25/24 230 lb (104.3 kg)  06/29/24 221 lb (100.2 kg)  06/20/24 221 lb 5.5 oz (100.4 kg)     Risk Assessment/Calculations:          Objective:    Vital Signs:   Today's Vitals   08/07/24 1310  Weight: 230 lb (104.3 kg)  Height: 5' 10 (1.778 m)   Body mass index is 33 kg/m. Normal affect. Normal speech pattern and tone. Comfortable, no apparent distress. No audible signs of SOB or wheezing.   ASSESSMENT & PLAN:    1.Chest pain - abnormal coronary CTA indicating significant proximal LAD and RCA disease - he will start ASA 81mg  daily, already on statin - plan for cath for definitive evaluation        Informed Consent   Shared Decision Making/Informed Consent The risks [stroke (1 in 1000), death (1 in 1000), kidney failure [usually temporary] (1 in 500), bleeding (1 in 200), allergic reaction [possibly serious] (1 in 200)], benefits (diagnostic support and management of coronary artery disease) and alternatives of a cardiac catheterization were discussed in detail with [name] and he is willing to proceed.        Time:   Today, I have spent 22  minutes with the patient with telehealth technology discussing the above problems.     Medication Adjustments/Labs and Tests Ordered: Current medicines are reviewed at length with the patient today.  Concerns regarding medicines are outlined above.   Tests Ordered: No orders of the defined types were placed in this encounter.   Medication Changes: No orders of the defined types were placed in this encounter.   Follow Up:  83month  Signed, Alvan Carrier, MD  08/07/2024 12:44 PM    [facility]     [1]  No outpatient medications have been marked as taking for the 08/07/24 encounter (Appointment) with Alvan Carrier FALCON, MD.  [2]  Social History Tobacco Use   Smoking status: Former    Types: Cigarettes    Passive exposure: Past   Smokeless tobacco: Never  Vaping Use   Vaping  status: Never Used  Substance Use Topics   Alcohol use: No    Comment: he drank on weeksends in his 26s, went to rehab due to losing licence, quit all etoh when he got out (1985)cohol 1985   Drug use: No   "

## 2024-08-08 ENCOUNTER — Ambulatory Visit (HOSPITAL_COMMUNITY): Attending: Occupational Therapy | Admitting: Occupational Therapy

## 2024-08-08 ENCOUNTER — Ambulatory Visit: Payer: Self-pay | Admitting: Cardiology

## 2024-08-09 ENCOUNTER — Ambulatory Visit: Payer: Self-pay | Admitting: Cardiology

## 2024-08-09 ENCOUNTER — Other Ambulatory Visit (HOSPITAL_COMMUNITY)
Admission: RE | Admit: 2024-08-09 | Discharge: 2024-08-09 | Disposition: A | Discharge status: Home / Self Care | Source: Ambulatory Visit | Attending: Cardiology | Admitting: Cardiology

## 2024-08-09 DIAGNOSIS — R0789 Other chest pain: Secondary | ICD-10-CM | POA: Insufficient documentation

## 2024-08-09 LAB — CBC
HCT: 39.5 % (ref 39.0–52.0)
Hemoglobin: 13.4 g/dL (ref 13.0–17.0)
MCH: 28.7 pg (ref 26.0–34.0)
MCHC: 33.9 g/dL (ref 30.0–36.0)
MCV: 84.6 fL (ref 80.0–100.0)
Platelets: 237 K/uL (ref 150–400)
RBC: 4.67 MIL/uL (ref 4.22–5.81)
RDW: 13.2 % (ref 11.5–15.5)
WBC: 7.5 K/uL (ref 4.0–10.5)
nRBC: 0 % (ref 0.0–0.2)

## 2024-08-09 LAB — BASIC METABOLIC PANEL WITH GFR
Anion gap: 14 (ref 5–15)
BUN: 9 mg/dL (ref 8–23)
CO2: 24 mmol/L (ref 22–32)
Calcium: 9 mg/dL (ref 8.9–10.3)
Chloride: 100 mmol/L (ref 98–111)
Creatinine, Ser: 1.03 mg/dL (ref 0.61–1.24)
GFR, Estimated: >60 mL/min
Glucose, Bld: 84 mg/dL (ref 70–99)
Potassium: 4 mmol/L (ref 3.5–5.1)
Sodium: 137 mmol/L (ref 135–145)

## 2024-08-10 ENCOUNTER — Telehealth: Payer: Self-pay | Admitting: Cardiology

## 2024-08-10 NOTE — Telephone Encounter (Signed)
 Returned call to pt to notify that time and date of cath have been moved as he requested. The new date is Friday,Jan. 30 at 1:30 pm. Pt is to arrive at 11:30am.

## 2024-08-10 NOTE — Telephone Encounter (Signed)
 Patient wants to have his Heart Cath postponed due to upcoming weather.

## 2024-08-14 NOTE — Telephone Encounter (Signed)
 Pt notified and all questions answered

## 2024-08-16 ENCOUNTER — Telehealth: Payer: Self-pay | Admitting: *Deleted

## 2024-08-16 NOTE — Telephone Encounter (Signed)
 Cardiac Catheterization scheduled at [facility] for: August 17, 2024 1:30 PM Arrival time [facility] Main Entrance A at: 11:30 AM  Diet: -May have light meal until 7:30 AM. (6 hours before procedure time) Approved light meal consists of plain toast, fruit, light soups, crackers.  Hydration: -May drink clear liquids until 2 hours before the procedure. Approved liquids: Water, clear tea, black coffee, fruit juices-non-citric and without pulp,Gatorade, plain Jello/popsicles.   -Please drink 16 oz of water 2 hours before procedure.  Medication instructions: -Usual morning medications can be taken including aspirin  81 mg.  Plan to go home the same day, you will only stay overnight if medically necessary.  You must have responsible adult to drive you home.  Someone must be with you the first 24 hours after you arrive home.  Reviewed procedure instructions with patient.

## 2024-08-17 ENCOUNTER — Other Ambulatory Visit: Payer: Self-pay

## 2024-08-17 ENCOUNTER — Ambulatory Visit (HOSPITAL_COMMUNITY)
Admission: RE | Admit: 2024-08-17 | Discharge: 2024-08-18 | Disposition: A | Attending: Cardiology | Admitting: Cardiology

## 2024-08-17 ENCOUNTER — Encounter (HOSPITAL_COMMUNITY): Admission: RE | Disposition: A | Payer: Self-pay | Source: Home / Self Care | Attending: Cardiology

## 2024-08-17 DIAGNOSIS — Z955 Presence of coronary angioplasty implant and graft: Secondary | ICD-10-CM

## 2024-08-17 DIAGNOSIS — I3139 Other pericardial effusion (noninflammatory): Secondary | ICD-10-CM | POA: Insufficient documentation

## 2024-08-17 DIAGNOSIS — F32A Depression, unspecified: Secondary | ICD-10-CM | POA: Diagnosis not present

## 2024-08-17 DIAGNOSIS — K219 Gastro-esophageal reflux disease without esophagitis: Secondary | ICD-10-CM | POA: Diagnosis not present

## 2024-08-17 DIAGNOSIS — I2510 Atherosclerotic heart disease of native coronary artery without angina pectoris: Secondary | ICD-10-CM

## 2024-08-17 DIAGNOSIS — Z87891 Personal history of nicotine dependence: Secondary | ICD-10-CM | POA: Insufficient documentation

## 2024-08-17 DIAGNOSIS — Z7982 Long term (current) use of aspirin: Secondary | ICD-10-CM | POA: Diagnosis not present

## 2024-08-17 DIAGNOSIS — E785 Hyperlipidemia, unspecified: Secondary | ICD-10-CM | POA: Diagnosis not present

## 2024-08-17 DIAGNOSIS — I351 Nonrheumatic aortic (valve) insufficiency: Secondary | ICD-10-CM | POA: Diagnosis not present

## 2024-08-17 DIAGNOSIS — Z7902 Long term (current) use of antithrombotics/antiplatelets: Secondary | ICD-10-CM | POA: Diagnosis not present

## 2024-08-17 DIAGNOSIS — Z8249 Family history of ischemic heart disease and other diseases of the circulatory system: Secondary | ICD-10-CM | POA: Insufficient documentation

## 2024-08-17 DIAGNOSIS — R0789 Other chest pain: Secondary | ICD-10-CM

## 2024-08-17 DIAGNOSIS — Z79899 Other long term (current) drug therapy: Secondary | ICD-10-CM | POA: Insufficient documentation

## 2024-08-17 DIAGNOSIS — I25110 Atherosclerotic heart disease of native coronary artery with unstable angina pectoris: Secondary | ICD-10-CM | POA: Diagnosis present

## 2024-08-17 DIAGNOSIS — F419 Anxiety disorder, unspecified: Secondary | ICD-10-CM | POA: Diagnosis not present

## 2024-08-17 LAB — POCT ACTIVATED CLOTTING TIME
Activated Clotting Time: 250 s
Activated Clotting Time: 281 s

## 2024-08-17 MED ORDER — VERAPAMIL HCL 2.5 MG/ML IV SOLN
INTRAVENOUS | Status: DC | PRN
  Administered 2024-08-17: 10 mL via INTRA_ARTERIAL

## 2024-08-17 MED ORDER — SODIUM CHLORIDE 0.9 % IV SOLN: 250.0000 mL | INTRAVENOUS | Status: DC | PRN

## 2024-08-17 MED ORDER — NITROGLYCERIN 1 MG/10 ML FOR IR/CATH LAB
INTRA_ARTERIAL | Status: AC
  Filled 2024-08-17: qty 10

## 2024-08-17 MED ORDER — HEPARIN (PORCINE) IN NACL 1000-0.9 UT/500ML-% IV SOLN
INTRAVENOUS | Status: DC | PRN
  Administered 2024-08-17 (×2): 500 mL

## 2024-08-17 MED ORDER — IOHEXOL 350 MG/ML SOLN
INTRAVENOUS | Status: DC | PRN
  Administered 2024-08-17: 200 mL

## 2024-08-17 MED ORDER — SODIUM CHLORIDE 0.9% FLUSH: 3.0000 mL | INTRAVENOUS | Status: DC | PRN

## 2024-08-17 MED ORDER — ASPIRIN 81 MG PO TBEC
81.0000 mg | DELAYED_RELEASE_TABLET | Freq: Every day | ORAL | Status: DC
  Administered 2024-08-18: 81 mg via ORAL
  Filled 2024-08-17: qty 1

## 2024-08-17 MED ORDER — ACETAMINOPHEN 325 MG PO TABS: 650.0000 mg | ORAL_TABLET | ORAL | Status: DC | PRN

## 2024-08-17 MED ORDER — CLOPIDOGREL BISULFATE 75 MG PO TABS
75.0000 mg | ORAL_TABLET | Freq: Every day | ORAL | Status: DC
  Administered 2024-08-18: 75 mg via ORAL
  Filled 2024-08-17: qty 1

## 2024-08-17 MED ORDER — SODIUM CHLORIDE 0.9% FLUSH
3.0000 mL | Freq: Two times a day (BID) | INTRAVENOUS | Status: DC
  Administered 2024-08-17 – 2024-08-18 (×2): 3 mL via INTRAVENOUS

## 2024-08-17 MED ORDER — MIDAZOLAM HCL 2 MG/2ML IJ SOLN
INTRAMUSCULAR | Status: AC
  Filled 2024-08-17: qty 2

## 2024-08-17 MED ORDER — NITROGLYCERIN 1 MG/10 ML FOR IR/CATH LAB
INTRA_ARTERIAL | Status: DC | PRN
  Administered 2024-08-17: 200 ug via INTRACORONARY

## 2024-08-17 MED ORDER — HYDRALAZINE HCL 20 MG/ML IJ SOLN: 10.0000 mg | INTRAMUSCULAR | Status: AC | PRN

## 2024-08-17 MED ORDER — CLOPIDOGREL BISULFATE 300 MG PO TABS
ORAL_TABLET | ORAL | Status: AC
  Filled 2024-08-17: qty 2

## 2024-08-17 MED ORDER — ALPRAZOLAM 0.25 MG PO TABS: 0.2500 mg | ORAL_TABLET | Freq: Every evening | ORAL | Status: DC | PRN

## 2024-08-17 MED ORDER — ASPIRIN 81 MG PO CHEW: 81.0000 mg | CHEWABLE_TABLET | ORAL | Status: DC

## 2024-08-17 MED ORDER — SODIUM CHLORIDE 0.9 % IV SOLN: INTRAVENOUS | Status: AC

## 2024-08-17 MED ORDER — MIDAZOLAM HCL (PF) 2 MG/2ML IJ SOLN
INTRAMUSCULAR | Status: DC | PRN
  Administered 2024-08-17: 1 mg via INTRAVENOUS
  Administered 2024-08-17: 2 mg via INTRAVENOUS

## 2024-08-17 MED ORDER — FENTANYL CITRATE (PF) 100 MCG/2ML IJ SOLN
INTRAMUSCULAR | Status: AC
  Filled 2024-08-17: qty 2

## 2024-08-17 MED ORDER — LABETALOL HCL 5 MG/ML IV SOLN: 10.0000 mg | INTRAVENOUS | Status: AC | PRN

## 2024-08-17 MED ORDER — HEPARIN SODIUM (PORCINE) 1000 UNIT/ML IJ SOLN
INTRAMUSCULAR | Status: DC | PRN
  Administered 2024-08-17: 4000 [IU] via INTRAVENOUS
  Administered 2024-08-17: 6000 [IU] via INTRAVENOUS
  Administered 2024-08-17: 8000 [IU] via INTRAVENOUS

## 2024-08-17 MED ORDER — CLOPIDOGREL BISULFATE 300 MG PO TABS
ORAL_TABLET | ORAL | Status: DC | PRN
  Administered 2024-08-17: 600 mg via ORAL

## 2024-08-17 MED ORDER — FENTANYL CITRATE (PF) 100 MCG/2ML IJ SOLN
INTRAMUSCULAR | Status: DC | PRN
  Administered 2024-08-17 (×2): 25 ug via INTRAVENOUS

## 2024-08-17 MED ORDER — LIDOCAINE HCL (PF) 1 % IJ SOLN
INTRAMUSCULAR | Status: AC
  Filled 2024-08-17: qty 30

## 2024-08-17 MED ORDER — LIDOCAINE HCL (PF) 1 % IJ SOLN
INTRAMUSCULAR | Status: DC | PRN
  Administered 2024-08-17: 5 mL via INTRADERMAL

## 2024-08-17 MED ORDER — NITROGLYCERIN 0.4 MG SL SUBL: 0.4000 mg | SUBLINGUAL_TABLET | SUBLINGUAL | Status: DC | PRN

## 2024-08-17 MED ORDER — ESCITALOPRAM OXALATE 10 MG PO TABS
20.0000 mg | ORAL_TABLET | Freq: Every evening | ORAL | Status: DC
  Administered 2024-08-17: 20 mg via ORAL
  Filled 2024-08-17: qty 2

## 2024-08-17 MED ORDER — PANTOPRAZOLE SODIUM 40 MG PO TBEC
40.0000 mg | DELAYED_RELEASE_TABLET | Freq: Every day | ORAL | Status: DC
  Administered 2024-08-17 – 2024-08-18 (×2): 40 mg via ORAL
  Filled 2024-08-17 (×2): qty 1

## 2024-08-17 MED ORDER — FREE WATER: 500.0000 mL | Freq: Once | Status: DC

## 2024-08-17 MED ORDER — VERAPAMIL HCL 2.5 MG/ML IV SOLN
INTRAVENOUS | Status: AC
  Filled 2024-08-17: qty 2

## 2024-08-17 MED ORDER — ONDANSETRON HCL 4 MG/2ML IJ SOLN: 4.0000 mg | Freq: Four times a day (QID) | INTRAMUSCULAR | Status: DC | PRN

## 2024-08-17 MED ORDER — SODIUM CHLORIDE 0.9% FLUSH: 3.0000 mL | Freq: Two times a day (BID) | INTRAVENOUS | Status: DC

## 2024-08-17 MED ORDER — HEPARIN SODIUM (PORCINE) 1000 UNIT/ML IJ SOLN
INTRAMUSCULAR | Status: AC
  Filled 2024-08-17: qty 10

## 2024-08-17 MED ORDER — ATORVASTATIN CALCIUM 40 MG PO TABS
40.0000 mg | ORAL_TABLET | Freq: Every day | ORAL | Status: DC
  Administered 2024-08-17: 40 mg via ORAL
  Filled 2024-08-17: qty 1

## 2024-08-17 NOTE — Interval H&P Note (Signed)
 History and Physical Interval Note:  08/17/2024 3:52 PM  [name]  has presented today for surgery, with the diagnosis of Progressive Angina with Abnormal Cardiac CT Angiogram.  The various methods of treatment have been discussed with the patient and family. After consideration of risks, benefits and other options for treatment, the patient has consented to  Procedures: LEFT HEART CATH AND CORONARY ANGIOGRAPHY (N/A)  PERCUTANEOUS CORONARY INTERVENTION  as a surgical intervention.  The patient's history has been reviewed, patient examined, no change in status, stable for surgery.  I have reviewed the patient's chart and labs.  Questions were answered to the patient's satisfaction.     Cath Lab Visit (complete for each Cath Lab visit)  Clinical Evaluation Leading to the Procedure:   ACS: No.  Non-ACS:    Anginal Classification: CCS III  Anti-ischemic medical therapy: Minimal Therapy (1 class of medications)  Non-Invasive Test Results: High-risk stress test findings: cardiac mortality >3%/year  Prior CABG: No previous CABG     Alm Clay

## 2024-08-18 ENCOUNTER — Ambulatory Visit (HOSPITAL_COMMUNITY)

## 2024-08-18 ENCOUNTER — Ambulatory Visit: Payer: Self-pay | Admitting: Cardiology

## 2024-08-18 ENCOUNTER — Encounter (HOSPITAL_COMMUNITY): Payer: Self-pay | Admitting: Cardiology

## 2024-08-18 ENCOUNTER — Other Ambulatory Visit (HOSPITAL_COMMUNITY): Payer: Self-pay

## 2024-08-18 DIAGNOSIS — I25110 Atherosclerotic heart disease of native coronary artery with unstable angina pectoris: Secondary | ICD-10-CM | POA: Diagnosis not present

## 2024-08-18 DIAGNOSIS — F32A Depression, unspecified: Secondary | ICD-10-CM | POA: Diagnosis not present

## 2024-08-18 DIAGNOSIS — R079 Chest pain, unspecified: Secondary | ICD-10-CM

## 2024-08-18 DIAGNOSIS — F419 Anxiety disorder, unspecified: Secondary | ICD-10-CM | POA: Diagnosis not present

## 2024-08-18 DIAGNOSIS — K219 Gastro-esophageal reflux disease without esophagitis: Secondary | ICD-10-CM | POA: Diagnosis not present

## 2024-08-18 LAB — CBC
HCT: 40.1 % (ref 39.0–52.0)
Hemoglobin: 13.9 g/dL (ref 13.0–17.0)
MCH: 28.8 pg (ref 26.0–34.0)
MCHC: 34.7 g/dL (ref 30.0–36.0)
MCV: 83.2 fL (ref 80.0–100.0)
Platelets: 218 10*3/uL (ref 150–400)
RBC: 4.82 MIL/uL (ref 4.22–5.81)
RDW: 13.4 % (ref 11.5–15.5)
WBC: 7.2 10*3/uL (ref 4.0–10.5)
nRBC: 0 % (ref 0.0–0.2)

## 2024-08-18 LAB — GLUCOSE, CAPILLARY: Glucose-Capillary: 114 mg/dL — ABNORMAL HIGH (ref 70–99)

## 2024-08-18 LAB — ECHOCARDIOGRAM COMPLETE
Area-P 1/2: 1.61 cm2
Calc EF: 52.2 %
Height: 70 in
S' Lateral: 3.5 cm
Single Plane A2C EF: 43.9 %
Single Plane A4C EF: 61.3 %
Weight: 3798.97 [oz_av]

## 2024-08-18 LAB — LIPID PANEL
Cholesterol: 196 mg/dL (ref 0–200)
HDL: 30 mg/dL — ABNORMAL LOW
LDL Cholesterol: 98 mg/dL (ref 0–99)
Total CHOL/HDL Ratio: 6.5 ratio
Triglycerides: 340 mg/dL — ABNORMAL HIGH
VLDL: 68 mg/dL — ABNORMAL HIGH (ref 0–40)

## 2024-08-18 LAB — HEMOGLOBIN A1C
Hgb A1c MFr Bld: 5.9 % — ABNORMAL HIGH (ref 4.8–5.6)
Mean Plasma Glucose: 122.63 mg/dL

## 2024-08-18 LAB — BASIC METABOLIC PANEL WITH GFR
Anion gap: 12 (ref 5–15)
BUN: 17 mg/dL (ref 8–23)
CO2: 25 mmol/L (ref 22–32)
Calcium: 9.3 mg/dL (ref 8.9–10.3)
Chloride: 98 mmol/L (ref 98–111)
Creatinine, Ser: 1.13 mg/dL (ref 0.61–1.24)
GFR, Estimated: >60 mL/min
Glucose, Bld: 108 mg/dL — ABNORMAL HIGH (ref 70–99)
Potassium: 4 mmol/L (ref 3.5–5.1)
Sodium: 136 mmol/L (ref 135–145)

## 2024-08-18 MED ORDER — CLOPIDOGREL BISULFATE 75 MG PO TABS
75.0000 mg | ORAL_TABLET | Freq: Every day | ORAL | 3 refills | Status: AC
  Filled 2024-08-18: qty 30, 30d supply, fill #0

## 2024-08-18 NOTE — Discharge Summary (Cosign Needed Addendum)
 " Discharge Summary   Patient ID: [name] MRN: [id number]; DOB: [birth year]  Admit date: 08/17/2024 Discharge date: 08/18/2024  PCP:  Sheryle Carwin, MD   [facility] Providers Cardiologist:  Alvan Carrier, MD    Discharge Diagnoses  Principal Problem:   CAD S/P percutaneous coronary angioplasty   Diagnostic Studies/Procedures    Echo from today:  Official report pending    LHC 08/17/24:  Fluoro time: 24.6 (min) DAP: 74.1 (Gycm2) Cumulative Air Kerma: 1396.3 (mGy)     All Culprit LESION Ost LAD to Prox LAD lesion is 95% stenosed.   A drug-eluting stent was successfully placed using a STENT SYNERGY XD 3.0X20, postdilated to 3.3 mm.  Post intervention, there is a 0% residual stenosis.   -----------------------------------------   RCA is a small, nondominant vessel.  Prox RCA lesion is 95% stenosed.   Large-caliber dominant LCx with moderate-sized bifurcating OM1, small OM 2, LPL 1 and L-PDA with minimal disease   -----------------------------------------   LV end diastolic pressure is mildly elevated.   There is no aortic valve stenosis.   No other specific elevator   Diagnostic:   Dominance: Left                                                               Intervention    Severe two-vessel disease involving near ostial LAD 95% stenosis as well as small nondominant RCA proximal 95% stenosis  Successful (technically difficult) DES PCI of the LAD reducing 95% to 0% using Synergy XD 3.0 mm x 20 mm postdilated to 3.30 mm. TIMI-3 flow maintained Nondominant RCA these will be treated medically   RECOMMENDATIONS   In the absence of any other complications or medical issues, we expect the patient to be ready for discharge from an interventional cardiology perspective on 08/18/2024.   Recommend uninterrupted dual antiplatelet therapy with Aspirin  81mg  daily and Clopidogrel  75mg  daily for a minimum of 6 months (stable ischemic heart disease-Class I recommendation).    After 6 months, would be okay to stop aspirin  and continue Plavix  long-term at least additional 6 months (preferably to complete 2 years).  Okay to hold Plavix  after 6 months (5-7 days preop for surgeries or procedures.   _____________   History of Present Illness    Per office note from Dr Alvan on 08/07/24:  [name] is a 69 y.o. male seen on 08/07/24 for follow up of the following medical problems.    1.Chest pain - ER visit 06/20/24 with chest pain - trops negative, EKG no ischemic changes   - symptoms started about 6 months ago - midchest, radiates between shoulder blades, radiates down both arms. . Squeezing like pain, at its worst 10/10 in severity. Can occur at rest or with activity. +SOB, get nauseous and can vomit. Pain lasts 1-4 minutes. Symptoms occur daily, mild episodes daily and strong episodes 2-3 times over a 2 week period.  - episode this AM while sweeping floor this morning.  - increasing in frequency   CAD risk factors: HLD, remote tobacco history x 14 years quit in the 1980s. Father with several MIs starting in his 42s, 2 sisters with MI he is not sure of age.      - Jan 2026 coronary CTA: 70-99% proximal LAD disease  significant by FFR, 50-69% proximal RCA disease significant by FFR.  - chest pains unchanged since last visit   He was arranged for outpatient LHC for definitive evaluation.     Hospital Course   Consultants: N/A   CAD  - arranged for outpatient heart cath 08/17/24 due to chest pain and abnormal CCTA  - LHC from 1/30.26 showed severe two-vessel disease involving near ostial LAD 95% stenosis as well as small nondominant RCA proximal 95% stenosis; successful DES PCI of the LAD reducing 95% to 0% using Synergy XD 3.0 mm x 20 mm postdilated to 3.30 mm. Nondominant RCA these will be treated medically.  - added lipid panel and HgbA1C today, Lpa pending,  can follow up at next appointment  - Echo today OK per Dr Kennyth, official report pending  -  Medical therapy: 6 month of DAPT with ASA 81mg  and Plavix  75mg , followed by Plavix  monotherapy for 6 month (2 years if able); started lipitor 40mg  daily; PRN nitroglycerin  - Post cath care discussed in detail via phone - New med sent to [facility] pharmacy today  - Follow up with [place] office 09/14/25 already arranged, will keep, no sooner appointment available   Depression/Anxiety - Home med continued  GERD - PPI continued       Did the patient have an acute coronary syndrome (MI, NSTEMI, STEMI, etc) this admission?:  No                               Did the patient have a percutaneous coronary intervention (stent / angioplasty)?:  Yes.     Cath/PCI Registry Performance & Quality Measures: Aspirin  prescribed? - Yes ADP Receptor Inhibitor (Plavix /Clopidogrel , Brilinta/Ticagrelor or Effient/Prasugrel) prescribed (includes medically managed patients)? - Yes High Intensity Statin (Lipitor 40-80mg  or Crestor 20-40mg ) prescribed? - Yes For EF <40%, was ACEI/ARB prescribed? - Not Applicable (EF >/= 40%) For EF <40%, Aldosterone Antagonist (Spironolactone or Eplerenone) prescribed? - Not Applicable (EF >/= 40%) Cardiac Rehab Phase II ordered? - Yes         _____________  Discharge Vitals Blood pressure (!) 146/88, pulse 87, temperature 97.7 F (36.5 C), temperature source Oral, resp. rate 18, height 5' 10 (1.778 m), weight 107.7 kg, SpO2 98%.  Filed Weights   08/17/24 1142 08/17/24 1800  Weight: 106.6 kg 107.7 kg   See attending progress note for exam today    Labs & Radiologic Studies  CBC Recent Labs    08/18/24 0411  WBC 7.2  HGB 13.9  HCT 40.1  MCV 83.2  PLT 218   Basic Metabolic Panel Recent Labs    98/68/73 0411  NA 136  K 4.0  CL 98  CO2 25  GLUCOSE 108*  BUN 17  CREATININE 1.13  CALCIUM  9.3   Liver Function Tests No results for input(s): AST, ALT, ALKPHOS, BILITOT, PROT, ALBUMIN in the last 72 hours. No results for input(s): LIPASE,  AMYLASE in the last 72 hours. High Sensitivity Troponin:   No results for input(s): TROPONINIHS in the last 720 hours.  No results for input(s): TRNPT in the last 720 hours.  BNP Invalid input(s): POCBNP No results for input(s): PROBNP in the last 72 hours.  No results for input(s): BNP in the last 72 hours.  D-Dimer No results for input(s): DDIMER in the last 72 hours. Hemoglobin A1C No results for input(s): HGBA1C in the last 72 hours. Fasting Lipid Panel No results for input(s): CHOL, HDL,  LDLCALC, TRIG, CHOLHDL, LDLDIRECT in the last 72 hours. No results found for: LIPOA  Thyroid Function Tests No results for input(s): TSH, T4TOTAL, T3FREE, THYROIDAB in the last 72 hours.  Invalid input(s): FREET3 _____________  CARDIAC CATHETERIZATION Result Date: 08/17/2024 Images from the original result were not included.   All Culprit LESION Ost LAD to Prox LAD lesion is 95% stenosed.   A drug-eluting stent was successfully placed using a STENT SYNERGY XD 3.0X20, postdilated to 3.3 mm.  Post intervention, there is a 0% residual stenosis.   -----------------------------------------   RCA is a small, nondominant vessel.  Prox RCA lesion is 95% stenosed.   Large-caliber dominant LCx with moderate-sized bifurcating OM1, small OM 2, LPL 1 and L-PDA with minimal disease   -----------------------------------------   LV end diastolic pressure is mildly elevated.   There is no aortic valve stenosis.   No other specific elevator Diagnostic:  Dominance: Left      Intervention Severe two-vessel disease involving near ostial LAD 95% stenosis as well as small nondominant RCA proximal 95% stenosis Successful (technically difficult) DES PCI of the LAD reducing 95% to 0% using Synergy XD 3.0 mm x 20 mm postdilated to 3.30 mm. TIMI-3 flow maintained Nondominant RCA these will be treated medically RECOMMENDATIONS   In the absence of any other complications or medical issues, we  expect the patient to be ready for discharge from an interventional cardiology perspective on 08/18/2024.   Recommend uninterrupted dual antiplatelet therapy with Aspirin  81mg  daily and Clopidogrel  75mg  daily for a minimum of 6 months (stable ischemic heart disease-Class I recommendation).   After 6 months, would be okay to stop aspirin  and continue Plavix  long-term at least additional 6 months (preferably to complete 2 years).  Okay to hold Plavix  after 6 months (5-7 days preop for surgeries or procedures.   CT CORONARY MORPH W/CTA COR W/SCORE W/CA W/CM &/OR WO/CM Addendum Date: 08/11/2024 ADDENDUM REPORT: 08/11/2024 20:32 EXAM: OVER-READ INTERPRETATION  CT CHEST The following report is an over-read performed by radiologist Dr. Oneil Devonshire of [facility] on 08/11/2024. This over-read does not include interpretation of cardiac or coronary anatomy or pathology. The coronary calcium  score/coronary CTA interpretation by the cardiologist is attached. COMPARISON:  None. FINDINGS: Cardiovascular: Mild atherosclerotic calcifications are noted. No aneurysmal dilatation is seen. No dissection is noted. No evidence of pulmonary emboli. Mediastinum/Nodes: There are no enlarged lymph nodes within the visualized mediastinum. Lungs/Pleura: There is no pleural effusion. 7 mm nodule is noted in the medial right lower lobe. Mild emphysematous changes are noted as well. Upper abdomen: No significant findings in the visualized upper abdomen. Musculoskeletal/Chest wall: No chest wall mass or suspicious osseous findings within the visualized chest. IMPRESSION: Aortic Atherosclerosis (ICD10-I70.0) and Emphysema (ICD10-J43.9). 7 mm nodule in the right lower lobe. Non-contrast chest CT at 6-12 months is recommended. If the nodule is stable at time of repeat CT, then future CT at 18-24 months (from today's scan) is considered optional for low-risk patients, but is recommended for high-risk patients. This recommendation follows  the consensus statement: Guidelines for Management of Incidental Pulmonary Nodules Detected on CT Images: From the Fleischner Society 2017; Radiology 2017; 284:228-243. Electronically Signed   By: Oneil Devonshire M.D.   On: 08/11/2024 20:32   Result Date: 08/11/2024 HISTORY: Chest Pain EXAM: Cardiac/Coronary  CT PROTOCOL: A non-contrast, gated CT scan was obtained with axial slices of 2.5 mm through the heart for calcium  scoring. Calcium  scoring was performed using the Agatston method. A 120 kV  prospective, gated, contrast cardiac CT scan was obtained. Gantry rotation speed was 230 msec and collimation was 0.63 mm. Two sublingual nitroglycerin  tablets (0.8 mg) were given. The 3D data set was reconstructed with motion correction for the best systolic or diastolic phase. Images were analyzed on a dedicated workstation using MPR, MIP, and VRT modes. The patient received 95 cc of contrast. FINDINGS: Image quality: Excellent. Artifact: Limited. Coronary calcium  score is 311, which places the patient in the 70th percentile for age and sex matched control. Coronary arteries: Normal coronary origins.  Left dominance. Left Main Coronary Artery: Normal-caliber, short length, bifurcates into left anterior descending artery (LAD) and a left circumflex artery (LCX). Left main is patent. Left Anterior Descending Artery: Normal caliber vessel, wraps the apex, gives off 4 diagonal branches. Severe stenosis (70-99%) at the proximal LAD due to mixed (predominantly soft) plaque. Remainder of the vessel is patent. D1: Small, patent D2: Small, patent D3: Patent D4: Patent Left Circumflex Artery: Minimal (< 24%) calcified and noncalcified plaque within the proximal and distal segments, otherwise patent. OM1: Small, patent OM2: Normal caliber, large size, minimal (< 24%) calcified plaque in the proximal segment, otherwise patent OM3: Patent Right Coronary Artery: Non dominant, moderate stenosis (50-69%) soft plaque at the proximal RCA.  Aorta: Normal size, 36 mm at the mid ascending aorta (level of the PA bifurcation) measured double oblique. Aortic atherosclerosis. Aortic Valve: Native valve, trileaflet aortic valve, minimal valvular calcification. Mitral valve: Native valve, no mitral annular calcification. Other findings: Normal variant of pulmonary vein drainage into the left atrium (3 left sided pulmonary veins). Normal left atrial appendage without thrombus. Dilatation of the main pulmonary artery, 31 mm, may indicate increased pulmonary pressures. Cannot exclude a small patent foramen ovale. Please see separate report from [facility] for non-cardiac findings. IMPRESSION: 1. Total coronary calcium  score is 311. This is 70th percentile for age and sex matched control. 2. Normal coronary origins with left dominance. 3. CAD-RADS 4 Severe CAD. 4. Severe stenosis (70-99%) at the proximal LAD due to mixed (predominantly soft) plaque. 5. Moderate stenosis (50-69%) soft plaque at the proximal RCA. 6. CT FFR will be performed and reported separately. 7. Aortic atherosclerosis. 8. Dilatation of the main pulmonary artery, 31 mm, may indicate increased pulmonary pressures. RECOMMENDATION: CT FFR results forthcoming. Intensify lipid-lowering therapy. Consider symptom-guided anti-ischemic pharmacotherapy as well as risk factor modification per guideline directed care. Electronically Signed: By: Madonna Large On: 08/06/2024 12:29   CT CORONARY FFR DATA PREP & FLUID ANALYSIS Result Date: 08/06/2024 EXAM: CT FFR ANALYSIS CLINICAL DATA:  Chest pain FINDINGS: FFRct analysis was performed on the original cardiac CT angiogram dataset. Diagrammatic representation of the FFRct analysis is provided in a separate PDF document in PACS. This dictation was created using the PDF document and an interactive 3D model of the results. 3D model is not available in the EMR/PACS. Normal FFR range is >0.80. Indeterminate (grey) zone is 0.76-0.80. FFR delta of 0.13 is  considered significant. 1. Left Main: FFR = 1.0 2. LAD: Proximal FFR = 1.0, Mid FFR = 0.60, Distal FFR = 0.55 3. LCX: Proximal FFR = 0.99, distal FFR = 0.98 4. RCA: Proximal FFR = 0.98, mid FFR =0.74, Distal FFR = not modeled IMPRESSION: 1. CT FFR analysis showed significant stenosis proximal LAD and proximal RCA. RECOMMENDATIONS: Invasive angiography recommended. Intensify lipid-lowering therapy. Guideline-directed medical therapy and aggressive risk factor modification for secondary prevention of coronary artery disease. Reaching out to the ordering provider to conveyed the results.  Electronically Signed   By: Madonna Large   On: 08/06/2024 13:30    Disposition Patient is seen by Dr Kennyth today, deemed stable for discharge to home. Med change, post cath care, follow up plan reviewed with the patient via phone. All questions answered. Pt is being discharged home today in good condition.  Follow-up Plans & Appointments  Discharge Instructions     Amb Referral to Cardiac Rehabilitation   Complete by: As directed    Diagnosis:  Coronary Stents PTCA     After initial evaluation and assessments completed: Virtual Based Care may be provided alone or in conjunction with Phase 2 Cardiac Rehab based on patient barriers.: Yes   Intensive Cardiac Rehabilitation (ICR) [facility] location only OR Traditional Cardiac Rehabilitation (TCR) *If criteria for ICR are not met will enroll in TCR [facility] only): Yes   Discharge instructions   Complete by: As directed    PLEASE REMEMBER TO BRING ALL OF YOUR MEDICATIONS TO EACH OF YOUR FOLLOW-UP OFFICE VISITS.  PLEASE ATTEND ALL SCHEDULED FOLLOW-UP APPOINTMENTS.   Activity: Increase activity slowly as tolerated. You may shower, but no soaking baths (or swimming) for 1 week. No driving for 24 hours. No lifting over 5 lbs for 1 week. No sexual activity for 1 week.   Wound Care: You may wash cath site gently with soap and water . Keep cath site clean and dry. If you notice pain,  swelling, bleeding or pus at your cath site, please call [phone].   Radial Site Care  Refer to this sheet in the next few weeks. These instructions provide you with information on caring for yourself after your procedure. Your caregiver may also give you more specific instructions. Your treatment has been planned according to current medical practices, but problems sometimes occur. Call your caregiver if you have any problems or questions after your procedure.  HOME CARE INSTRUCTIONS You may shower the day after the procedure. Remove the bandage (dressing) and gently wash the site with plain soap and water . Gently pat the site dry.  Do not apply powder or lotion to the site.  Do not submerge the affected site in water  for 3 to 5 days.  Inspect the site at least twice daily.  Do not flex or bend the affected arm for 24 hours.  No lifting over 5 pounds (2.3 kg) for 5 days after your procedure.  Do not drive home if you are discharged the same day of the procedure. Have someone else drive you.  You may drive 24 hours after the procedure unless otherwise instructed by your caregiver.   What to expect: Any bruising will usually fade within 1 to 2 weeks.  Blood that collects in the tissue (hematoma) may be painful to the touch. It should usually decrease in size and tenderness within 1 to 2 weeks.   SEEK IMMEDIATE MEDICAL CARE IF: You have unusual pain at the radial site.  You have redness, warmth, swelling, or pain at the radial site.  You have drainage (other than a small amount of blood on the dressing).  You have chills.  You have a fever or persistent symptoms for more than 72 hours.  You have a fever and your symptoms suddenly get worse.  Your arm becomes pale, cool, tingly, or numb.  You have heavy bleeding from the site. Hold pressure on the site.    PLEASE DO NOT MISS ANY DOSES OF YOUR ASPIRIN /PLAVIX  !!!!!   Also keep a log of you blood pressures and  bring back to your  follow up appt. Please call the office with any questions.   Patients taking blood thinners should generally stay away from medicines like ibuprofen , Advil , Motrin , naproxen, and Aleve due to risk of stomach bleeding. You may take Tylenol  as directed or talk to your primary doctor about alternatives.  PLEASE ENSURE THAT YOU DO NOT RUN OUT OF YOUR ASPIRIN //PLAVIX . This medication is very important to remain on for at least 6months. IF you have issues obtaining this medication due to cost please CALL the office 3-5 business days prior to running out in order to prevent missing doses of this medication.   Increase activity slowly   Complete by: As directed        Discharge Medications Allergies as of 08/18/2024       Reactions   Codeine Other (See Comments)   Out of right state of mind.    Meloxicam  Itching        Medication List     STOP taking these medications    phentermine 37.5 MG capsule       TAKE these medications    ALPRAZolam  0.25 MG dissolvable tablet Commonly known as: NIRAVAM  Take 0.25 mg by mouth at bedtime as needed for anxiety.   aspirin  EC 81 MG tablet Take 81 mg by mouth daily. Swallow whole.   atorvastatin  40 MG tablet Commonly known as: LIPITOR Take 40 mg by mouth at bedtime.   clopidogrel  75 MG tablet Commonly known as: PLAVIX  Take 1 tablet (75 mg total) by mouth daily with breakfast. Start taking on: August 19, 2024   escitalopram  20 MG tablet Commonly known as: LEXAPRO  Take 20 mg by mouth every evening.   nitroGLYCERIN  0.4 MG SL tablet Commonly known as: NITROSTAT  Place 0.4 mg under the tongue every 5 (five) minutes as needed for chest pain.   pantoprazole  40 MG tablet Commonly known as: PROTONIX  Take 1 tablet (40 mg total) by mouth 2 (two) times daily. What changed: when to take this         Outstanding Labs/Studies N/a  Duration of Discharge Encounter: APP Time: 30 minutes   Signed, Shirl Fruits, NP 08/18/2024, 11:41  AM     "

## 2024-08-18 NOTE — Progress Notes (Signed)
"  °  Progress Note  Patient Name: [name] Date of Encounter: 08/18/2024 [facility] Cardiologist: Alvan Carrier, MD   Interval Summary   LHC yesterday with PCI. No acute overnight events. Patient reports feeling relatively well. No new or acute complaints.   Vital Signs Vitals:   08/17/24 1957 08/18/24 0001 08/18/24 0353 08/18/24 0727  BP: (!) 144/93 118/77 135/86 (!) 146/88  Pulse:  81 73 87  Resp:  16 16 18   Temp:  98.4 F (36.9 C) 97.8 F (36.6 C) 97.7 F (36.5 C)  TempSrc:  Oral Oral Oral  SpO2:  96% 98% 98%  Weight:      Height:        Intake/Output Summary (Last 24 hours) at 08/18/2024 0933 Last data filed at 08/18/2024 0931 Gross per 24 hour  Intake 243 ml  Output --  Net 243 ml      08/17/2024    6:00 PM 08/17/2024   11:42 AM 08/07/2024    1:10 PM  Last 3 Weights  Weight (lbs) 237 lb 7 oz 235 lb 230 lb  Weight (kg) 107.7 kg 106.595 kg 104.327 kg      Telemetry/ECG  SR - Personally Reviewed  Physical Exam  General: Well developed, in no acute distress.  Neck: No JVD.  Cardiac: Normal rate, regular rhythm.  Resp: Normal work of breathing.  Ext: No edema.  Neuro: No gross focal deficits.  Psych: Normal affect.   LHC 08/17/24 Severe two-vessel disease involving near ostial LAD 95% stenosis as well as small nondominant RCA proximal 95% stenosis  Successful (technically difficult) DES PCI of the LAD reducing 95% to 0% using Synergy XD 3.0 mm x 20 mm postdilated to 3.30 mm. TIMI-3 flow maintained Nondominant RCA these will be treated medically   RECOMMENDATIONS   In the absence of any other complications or medical issues, we expect the patient to be ready for discharge from an interventional cardiology perspective on 08/18/2024.   Recommend uninterrupted dual antiplatelet therapy with Aspirin  81mg  daily and Clopidogrel  75mg  daily for a minimum of 6 months (stable ischemic heart disease-Class I recommendation).   After 6 months, would be okay  to stop aspirin  and continue Plavix  long-term at least additional 6 months (preferably to complete 2 years).  Okay to hold Plavix  after 6 months (5-7 days preop for surgeries or procedures.  Assessment & Plan  #2v CAD  #S/p LAD stent #Chest pain - Had outpatient coronary CTA which was abnormal. Presented on 1/30 for planned LHC. Underwent PCI to prox LAD. Kept overnight for routine monitoring.  - Continue aspirin  81mg  daily.  - Continue Plavix  75mg  daily.  - Continue atorvastatin  40mg  daily.  - Echo done this morning, no priors in chart so ordered prior to discharge. Formal read pending, but by my interpretation normal EF with no significant valvular disease.   Okay for discharge.   Signed, Fonda Kitty, MD   "

## 2024-08-18 NOTE — Progress Notes (Signed)
" °  Echocardiogram 2D Echocardiogram has been performed.  Tinnie FORBES Gosling RDCS 08/18/2024, 10:10 AM "

## 2024-08-18 NOTE — Plan of Care (Signed)

## 2024-08-18 NOTE — Progress Notes (Signed)
 CARDIAC REHAB PHASE I   PRE:  Rate/Rhythm: 71 SR    BP: sitting 146/88    SpO2: 97 RA  MODE:  Ambulation: 400 ft   POST:  Rate/Rhythm: 100 ST   Pt ambulated independently, felt well. BP elevated this morning.   Discussed with pt and partner stent, restrictions, Plavix  importance,  diet, exercise, NTG, and CRPII. Pt receptive. Will refer to [facility] CRPII.  [phone] Aliene Aris BS, ACSM-CEP 08/18/2024 7:50 AM

## 2024-08-20 LAB — LIPOPROTEIN A (LPA): Lipoprotein (a): 37.6 nmol/L — ABNORMAL HIGH

## 2024-08-22 ENCOUNTER — Encounter (HOSPITAL_COMMUNITY): Payer: Self-pay

## 2024-08-23 ENCOUNTER — Encounter (HOSPITAL_COMMUNITY): Payer: Self-pay | Admitting: *Deleted

## 2024-08-23 ENCOUNTER — Encounter (HOSPITAL_COMMUNITY): Payer: Self-pay

## 2024-08-23 ENCOUNTER — Encounter (HOSPITAL_COMMUNITY): Admission: RE | Admit: 2024-08-23 | Discharge: 2024-08-23 | Attending: Cardiology

## 2024-08-23 DIAGNOSIS — Z955 Presence of coronary angioplasty implant and graft: Secondary | ICD-10-CM

## 2024-08-23 NOTE — Progress Notes (Signed)
 Completed virtual orientation today.  EP evaluation is scheduled for Monday 08/27/24 at 800 .  Documentation for diagnosis can be found in [facility] encounter 08/17/24.

## 2024-08-27 ENCOUNTER — Encounter (HOSPITAL_COMMUNITY)

## 2024-09-14 ENCOUNTER — Ambulatory Visit: Admitting: Orthopedic Surgery

## 2024-09-14 ENCOUNTER — Ambulatory Visit: Admitting: Physician Assistant

## 2025-04-22 ENCOUNTER — Encounter: Admitting: Orthopedic Surgery
